# Patient Record
Sex: Female | Born: 1940
Health system: Southern US, Community
[De-identification: ages and names within clinical notes are randomized; demographics above are authoritative.]

## PROBLEM LIST (undated history)

## (undated) DIAGNOSIS — M1990 Unspecified osteoarthritis, unspecified site: Secondary | ICD-10-CM

## (undated) DIAGNOSIS — E119 Type 2 diabetes mellitus without complications: Secondary | ICD-10-CM

## (undated) DIAGNOSIS — Z9289 Personal history of other medical treatment: Secondary | ICD-10-CM

## (undated) DIAGNOSIS — I2510 Atherosclerotic heart disease of native coronary artery without angina pectoris: Secondary | ICD-10-CM

## (undated) DIAGNOSIS — E785 Hyperlipidemia, unspecified: Secondary | ICD-10-CM

## (undated) HISTORY — PX: CARPAL TUNNEL RELEASE: SHX101

## (undated) HISTORY — PX: COLONOSCOPY: SHX174

## (undated) HISTORY — PX: TUBAL LIGATION: SHX77

## (undated) HISTORY — PX: EYE SURGERY: SHX253

## (undated) HISTORY — PX: CARDIAC CATHETERIZATION: SHX172

---

## 1990-01-11 HISTORY — PX: BACK SURGERY: SHX140

## 1994-11-10 HISTORY — PX: CORONARY ARTERY BYPASS GRAFT: SHX141

## 2011-01-18 DIAGNOSIS — M25519 Pain in unspecified shoulder: Secondary | ICD-10-CM | POA: Diagnosis not present

## 2011-02-01 DIAGNOSIS — M25519 Pain in unspecified shoulder: Secondary | ICD-10-CM | POA: Diagnosis not present

## 2011-02-15 DIAGNOSIS — M19049 Primary osteoarthritis, unspecified hand: Secondary | ICD-10-CM | POA: Diagnosis not present

## 2011-02-15 DIAGNOSIS — M76899 Other specified enthesopathies of unspecified lower limb, excluding foot: Secondary | ICD-10-CM | POA: Diagnosis not present

## 2011-02-15 DIAGNOSIS — M719 Bursopathy, unspecified: Secondary | ICD-10-CM | POA: Diagnosis not present

## 2011-02-15 DIAGNOSIS — M67919 Unspecified disorder of synovium and tendon, unspecified shoulder: Secondary | ICD-10-CM | POA: Diagnosis not present

## 2011-02-18 DIAGNOSIS — E11329 Type 2 diabetes mellitus with mild nonproliferative diabetic retinopathy without macular edema: Secondary | ICD-10-CM | POA: Diagnosis not present

## 2011-02-18 DIAGNOSIS — E119 Type 2 diabetes mellitus without complications: Secondary | ICD-10-CM | POA: Diagnosis not present

## 2011-02-24 DIAGNOSIS — I2510 Atherosclerotic heart disease of native coronary artery without angina pectoris: Secondary | ICD-10-CM | POA: Diagnosis not present

## 2011-02-24 DIAGNOSIS — E119 Type 2 diabetes mellitus without complications: Secondary | ICD-10-CM | POA: Diagnosis not present

## 2011-02-24 DIAGNOSIS — I10 Essential (primary) hypertension: Secondary | ICD-10-CM | POA: Diagnosis not present

## 2011-04-16 DIAGNOSIS — H2589 Other age-related cataract: Secondary | ICD-10-CM | POA: Diagnosis not present

## 2011-05-25 DIAGNOSIS — H2589 Other age-related cataract: Secondary | ICD-10-CM | POA: Diagnosis not present

## 2011-05-25 DIAGNOSIS — H269 Unspecified cataract: Secondary | ICD-10-CM | POA: Diagnosis not present

## 2011-05-26 DIAGNOSIS — B354 Tinea corporis: Secondary | ICD-10-CM | POA: Diagnosis not present

## 2011-05-26 DIAGNOSIS — M159 Polyosteoarthritis, unspecified: Secondary | ICD-10-CM | POA: Diagnosis not present

## 2011-06-01 DIAGNOSIS — Z09 Encounter for follow-up examination after completed treatment for conditions other than malignant neoplasm: Secondary | ICD-10-CM | POA: Diagnosis not present

## 2011-06-01 DIAGNOSIS — H04129 Dry eye syndrome of unspecified lacrimal gland: Secondary | ICD-10-CM | POA: Diagnosis not present

## 2011-07-01 DIAGNOSIS — I10 Essential (primary) hypertension: Secondary | ICD-10-CM | POA: Diagnosis not present

## 2011-07-01 DIAGNOSIS — E782 Mixed hyperlipidemia: Secondary | ICD-10-CM | POA: Diagnosis not present

## 2011-07-01 DIAGNOSIS — E119 Type 2 diabetes mellitus without complications: Secondary | ICD-10-CM | POA: Diagnosis not present

## 2011-07-01 DIAGNOSIS — Z1331 Encounter for screening for depression: Secondary | ICD-10-CM | POA: Diagnosis not present

## 2011-07-06 DIAGNOSIS — H2589 Other age-related cataract: Secondary | ICD-10-CM | POA: Diagnosis not present

## 2011-07-06 DIAGNOSIS — H2510 Age-related nuclear cataract, unspecified eye: Secondary | ICD-10-CM | POA: Diagnosis not present

## 2011-07-06 DIAGNOSIS — H269 Unspecified cataract: Secondary | ICD-10-CM | POA: Diagnosis not present

## 2011-09-17 DIAGNOSIS — I10 Essential (primary) hypertension: Secondary | ICD-10-CM | POA: Diagnosis not present

## 2011-09-17 DIAGNOSIS — E119 Type 2 diabetes mellitus without complications: Secondary | ICD-10-CM | POA: Diagnosis not present

## 2011-09-17 DIAGNOSIS — E782 Mixed hyperlipidemia: Secondary | ICD-10-CM | POA: Diagnosis not present

## 2012-12-04 DIAGNOSIS — Z23 Encounter for immunization: Secondary | ICD-10-CM | POA: Diagnosis not present

## 2012-12-04 DIAGNOSIS — E782 Mixed hyperlipidemia: Secondary | ICD-10-CM | POA: Diagnosis not present

## 2012-12-04 DIAGNOSIS — I10 Essential (primary) hypertension: Secondary | ICD-10-CM | POA: Diagnosis not present

## 2012-12-04 DIAGNOSIS — E119 Type 2 diabetes mellitus without complications: Secondary | ICD-10-CM | POA: Diagnosis not present

## 2013-01-01 DIAGNOSIS — M25519 Pain in unspecified shoulder: Secondary | ICD-10-CM | POA: Diagnosis not present

## 2013-01-01 DIAGNOSIS — G8929 Other chronic pain: Secondary | ICD-10-CM | POA: Diagnosis not present

## 2013-01-01 DIAGNOSIS — R05 Cough: Secondary | ICD-10-CM | POA: Diagnosis not present

## 2013-01-01 DIAGNOSIS — Z1389 Encounter for screening for other disorder: Secondary | ICD-10-CM | POA: Diagnosis not present

## 2013-01-15 DIAGNOSIS — Z1389 Encounter for screening for other disorder: Secondary | ICD-10-CM | POA: Diagnosis not present

## 2013-01-15 DIAGNOSIS — Z6825 Body mass index (BMI) 25.0-25.9, adult: Secondary | ICD-10-CM | POA: Diagnosis not present

## 2013-01-15 DIAGNOSIS — M25519 Pain in unspecified shoulder: Secondary | ICD-10-CM | POA: Diagnosis not present

## 2013-01-23 DIAGNOSIS — M719 Bursopathy, unspecified: Secondary | ICD-10-CM | POA: Diagnosis not present

## 2013-01-23 DIAGNOSIS — M75120 Complete rotator cuff tear or rupture of unspecified shoulder, not specified as traumatic: Secondary | ICD-10-CM | POA: Diagnosis not present

## 2013-01-23 DIAGNOSIS — M25519 Pain in unspecified shoulder: Secondary | ICD-10-CM | POA: Diagnosis not present

## 2013-01-23 DIAGNOSIS — M67919 Unspecified disorder of synovium and tendon, unspecified shoulder: Secondary | ICD-10-CM | POA: Diagnosis not present

## 2013-06-15 DIAGNOSIS — Z6827 Body mass index (BMI) 27.0-27.9, adult: Secondary | ICD-10-CM | POA: Diagnosis not present

## 2013-06-15 DIAGNOSIS — Z1331 Encounter for screening for depression: Secondary | ICD-10-CM | POA: Diagnosis not present

## 2013-06-15 DIAGNOSIS — E119 Type 2 diabetes mellitus without complications: Secondary | ICD-10-CM | POA: Diagnosis not present

## 2013-06-15 DIAGNOSIS — E782 Mixed hyperlipidemia: Secondary | ICD-10-CM | POA: Diagnosis not present

## 2013-06-15 DIAGNOSIS — I10 Essential (primary) hypertension: Secondary | ICD-10-CM | POA: Diagnosis not present

## 2013-06-26 DIAGNOSIS — Z139 Encounter for screening, unspecified: Secondary | ICD-10-CM | POA: Diagnosis not present

## 2013-06-26 DIAGNOSIS — M25569 Pain in unspecified knee: Secondary | ICD-10-CM | POA: Diagnosis not present

## 2013-06-26 DIAGNOSIS — M1710 Unilateral primary osteoarthritis, unspecified knee: Secondary | ICD-10-CM | POA: Diagnosis not present

## 2013-06-26 DIAGNOSIS — M1990 Unspecified osteoarthritis, unspecified site: Secondary | ICD-10-CM | POA: Diagnosis not present

## 2013-06-26 DIAGNOSIS — R937 Abnormal findings on diagnostic imaging of other parts of musculoskeletal system: Secondary | ICD-10-CM | POA: Diagnosis not present

## 2013-06-26 DIAGNOSIS — Z6827 Body mass index (BMI) 27.0-27.9, adult: Secondary | ICD-10-CM | POA: Diagnosis not present

## 2013-06-26 DIAGNOSIS — IMO0002 Reserved for concepts with insufficient information to code with codable children: Secondary | ICD-10-CM | POA: Diagnosis not present

## 2013-06-27 DIAGNOSIS — Z951 Presence of aortocoronary bypass graft: Secondary | ICD-10-CM | POA: Diagnosis not present

## 2013-06-27 DIAGNOSIS — E119 Type 2 diabetes mellitus without complications: Secondary | ICD-10-CM | POA: Diagnosis not present

## 2013-06-27 DIAGNOSIS — I2510 Atherosclerotic heart disease of native coronary artery without angina pectoris: Secondary | ICD-10-CM | POA: Diagnosis not present

## 2013-06-27 DIAGNOSIS — E785 Hyperlipidemia, unspecified: Secondary | ICD-10-CM | POA: Diagnosis not present

## 2013-06-27 DIAGNOSIS — I10 Essential (primary) hypertension: Secondary | ICD-10-CM | POA: Diagnosis not present

## 2013-07-03 DIAGNOSIS — Z1231 Encounter for screening mammogram for malignant neoplasm of breast: Secondary | ICD-10-CM | POA: Diagnosis not present

## 2013-07-10 DIAGNOSIS — M1710 Unilateral primary osteoarthritis, unspecified knee: Secondary | ICD-10-CM | POA: Diagnosis not present

## 2013-07-10 DIAGNOSIS — Z6827 Body mass index (BMI) 27.0-27.9, adult: Secondary | ICD-10-CM | POA: Diagnosis not present

## 2013-07-10 DIAGNOSIS — IMO0002 Reserved for concepts with insufficient information to code with codable children: Secondary | ICD-10-CM | POA: Diagnosis not present

## 2013-10-15 DIAGNOSIS — M179 Osteoarthritis of knee, unspecified: Secondary | ICD-10-CM | POA: Diagnosis not present

## 2013-10-15 DIAGNOSIS — Z139 Encounter for screening, unspecified: Secondary | ICD-10-CM | POA: Diagnosis not present

## 2013-10-15 DIAGNOSIS — Z23 Encounter for immunization: Secondary | ICD-10-CM | POA: Diagnosis not present

## 2013-10-15 DIAGNOSIS — Z0000 Encounter for general adult medical examination without abnormal findings: Secondary | ICD-10-CM | POA: Diagnosis not present

## 2013-10-15 DIAGNOSIS — Z1389 Encounter for screening for other disorder: Secondary | ICD-10-CM | POA: Diagnosis not present

## 2013-10-23 DIAGNOSIS — M1711 Unilateral primary osteoarthritis, right knee: Secondary | ICD-10-CM | POA: Diagnosis not present

## 2013-11-22 DIAGNOSIS — E119 Type 2 diabetes mellitus without complications: Secondary | ICD-10-CM | POA: Diagnosis not present

## 2013-11-22 DIAGNOSIS — I10 Essential (primary) hypertension: Secondary | ICD-10-CM | POA: Diagnosis not present

## 2013-11-23 DIAGNOSIS — M1712 Unilateral primary osteoarthritis, left knee: Secondary | ICD-10-CM | POA: Diagnosis not present

## 2013-11-23 DIAGNOSIS — M25562 Pain in left knee: Secondary | ICD-10-CM | POA: Diagnosis not present

## 2013-11-26 DIAGNOSIS — E1151 Type 2 diabetes mellitus with diabetic peripheral angiopathy without gangrene: Secondary | ICD-10-CM | POA: Diagnosis not present

## 2013-11-26 DIAGNOSIS — Z951 Presence of aortocoronary bypass graft: Secondary | ICD-10-CM | POA: Diagnosis not present

## 2013-11-26 DIAGNOSIS — I10 Essential (primary) hypertension: Secondary | ICD-10-CM | POA: Diagnosis not present

## 2013-11-26 DIAGNOSIS — I2510 Atherosclerotic heart disease of native coronary artery without angina pectoris: Secondary | ICD-10-CM | POA: Diagnosis not present

## 2013-11-26 DIAGNOSIS — E785 Hyperlipidemia, unspecified: Secondary | ICD-10-CM | POA: Diagnosis not present

## 2014-01-15 DIAGNOSIS — M179 Osteoarthritis of knee, unspecified: Secondary | ICD-10-CM | POA: Diagnosis not present

## 2014-01-15 DIAGNOSIS — Z6830 Body mass index (BMI) 30.0-30.9, adult: Secondary | ICD-10-CM | POA: Diagnosis not present

## 2014-01-22 DIAGNOSIS — E119 Type 2 diabetes mellitus without complications: Secondary | ICD-10-CM | POA: Diagnosis not present

## 2014-01-22 DIAGNOSIS — I10 Essential (primary) hypertension: Secondary | ICD-10-CM | POA: Diagnosis not present

## 2014-01-22 DIAGNOSIS — I2510 Atherosclerotic heart disease of native coronary artery without angina pectoris: Secondary | ICD-10-CM | POA: Diagnosis not present

## 2014-01-22 DIAGNOSIS — Z951 Presence of aortocoronary bypass graft: Secondary | ICD-10-CM | POA: Diagnosis not present

## 2014-01-22 DIAGNOSIS — E785 Hyperlipidemia, unspecified: Secondary | ICD-10-CM | POA: Diagnosis not present

## 2014-01-31 DIAGNOSIS — M1990 Unspecified osteoarthritis, unspecified site: Secondary | ICD-10-CM | POA: Diagnosis not present

## 2014-01-31 DIAGNOSIS — M25561 Pain in right knee: Secondary | ICD-10-CM | POA: Diagnosis not present

## 2014-02-05 DIAGNOSIS — M170 Bilateral primary osteoarthritis of knee: Secondary | ICD-10-CM | POA: Diagnosis not present

## 2014-02-12 DIAGNOSIS — M170 Bilateral primary osteoarthritis of knee: Secondary | ICD-10-CM | POA: Diagnosis not present

## 2014-02-19 DIAGNOSIS — M170 Bilateral primary osteoarthritis of knee: Secondary | ICD-10-CM | POA: Diagnosis not present

## 2014-02-19 DIAGNOSIS — M25461 Effusion, right knee: Secondary | ICD-10-CM | POA: Diagnosis not present

## 2014-02-26 DIAGNOSIS — M1711 Unilateral primary osteoarthritis, right knee: Secondary | ICD-10-CM | POA: Diagnosis not present

## 2014-02-27 ENCOUNTER — Other Ambulatory Visit: Payer: Self-pay | Admitting: Orthopedic Surgery

## 2014-03-05 DIAGNOSIS — M1711 Unilateral primary osteoarthritis, right knee: Secondary | ICD-10-CM | POA: Diagnosis not present

## 2014-03-14 DIAGNOSIS — I2510 Atherosclerotic heart disease of native coronary artery without angina pectoris: Secondary | ICD-10-CM | POA: Diagnosis not present

## 2014-03-14 DIAGNOSIS — E1151 Type 2 diabetes mellitus with diabetic peripheral angiopathy without gangrene: Secondary | ICD-10-CM | POA: Diagnosis not present

## 2014-03-14 DIAGNOSIS — I10 Essential (primary) hypertension: Secondary | ICD-10-CM | POA: Diagnosis not present

## 2014-03-14 DIAGNOSIS — E785 Hyperlipidemia, unspecified: Secondary | ICD-10-CM | POA: Diagnosis not present

## 2014-03-20 ENCOUNTER — Other Ambulatory Visit (HOSPITAL_COMMUNITY): Payer: Self-pay | Admitting: *Deleted

## 2014-03-20 NOTE — Pre-Procedure Instructions (Signed)
Mary [name]  03/20/2014   Your procedure is scheduled on:  Monday, April 01, 2014    Report to [facility] Entrance "A" Admitting Office at 8:00 AM.   Call this number if you have problems the morning of surgery: [phone]               Any questions prior to day of surgery, please call [phone] between 8 & 4 PM, Monday-Friday   Remember:   Do not eat food or drink liquids after midnight Sunday, 03/31/14   Take these medicines the morning of surgery with A SIP OF WATER:    Do not wear jewelry, make-up or nail polish.  Do not wear lotions, powders, or perfumes. You may wear deodorant.  Do not shave 48 hours prior to surgery.   Do not bring valuables to the hospital.  [facility] is not responsible                  for any belongings or valuables.               Contacts, dentures or bridgework may not be worn into surgery.  Leave suitcase in the car. After surgery it may be brought to your room.  For patients admitted to the hospital, discharge time is determined by your                treatment team.             Special Instructions: See "Preparing for Surgery" Instruction sheet.   Please read over the following fact sheets that you were given: Pain Booklet, Coughing and Deep Breathing, Blood Transfusion Information, MRSA Information and Surgical Site Infection Prevention

## 2014-03-21 ENCOUNTER — Encounter (HOSPITAL_COMMUNITY)
Admission: RE | Admit: 2014-03-21 | Discharge: 2014-03-21 | Disposition: A | Discharge status: Home / Self Care | Payer: Medicare Other | Source: Ambulatory Visit | Attending: Orthopedic Surgery | Admitting: Orthopedic Surgery

## 2014-03-21 ENCOUNTER — Encounter (HOSPITAL_COMMUNITY): Payer: Self-pay

## 2014-03-21 DIAGNOSIS — Z01818 Encounter for other preprocedural examination: Secondary | ICD-10-CM | POA: Insufficient documentation

## 2014-03-21 DIAGNOSIS — Z01812 Encounter for preprocedural laboratory examination: Secondary | ICD-10-CM | POA: Insufficient documentation

## 2014-03-21 DIAGNOSIS — M1711 Unilateral primary osteoarthritis, right knee: Secondary | ICD-10-CM | POA: Insufficient documentation

## 2014-03-21 HISTORY — DX: Unspecified osteoarthritis, unspecified site: M19.90

## 2014-03-21 HISTORY — DX: Hyperlipidemia, unspecified: E78.5

## 2014-03-21 HISTORY — DX: Type 2 diabetes mellitus without complications: E11.9

## 2014-03-21 HISTORY — DX: Personal history of other medical treatment: Z92.89

## 2014-03-21 LAB — ABO/RH: ABO/RH(D): O POS

## 2014-03-21 LAB — SURGICAL PCR SCREEN
MRSA, PCR: POSITIVE — AB
STAPHYLOCOCCUS AUREUS: POSITIVE — AB

## 2014-03-21 LAB — BASIC METABOLIC PANEL
Anion gap: 10 (ref 5–15)
BUN: 9 mg/dL (ref 6–23)
CALCIUM: 9.7 mg/dL (ref 8.4–10.5)
CO2: 29 mmol/L (ref 19–32)
Chloride: 101 mmol/L (ref 96–112)
Creatinine, Ser: 0.61 mg/dL (ref 0.50–1.10)
GFR calc Af Amer: >90 mL/min (ref >90)
GFR calc non Af Amer: 88 mL/min — ABNORMAL LOW (ref >90)
Glucose, Bld: 110 mg/dL — ABNORMAL HIGH (ref 70–99)
Potassium: 4.4 mmol/L (ref 3.5–5.1)
Sodium: 140 mmol/L (ref 135–145)

## 2014-03-21 LAB — CBC WITH DIFFERENTIAL/PLATELET
BASOS ABS: 0.1 10*3/uL (ref 0.0–0.1)
Basophils Relative: 1 % (ref 0–1)
Eosinophils Absolute: 0.1 10*3/uL (ref 0.0–0.7)
Eosinophils Relative: 1 % (ref 0–5)
HCT: 41.1 % (ref 36.0–46.0)
Hemoglobin: 13.7 g/dL (ref 12.0–15.0)
LYMPHS PCT: 24 % (ref 12–46)
Lymphs Abs: 2.4 10*3/uL (ref 0.7–4.0)
MCH: 29.1 pg (ref 26.0–34.0)
MCHC: 33.3 g/dL (ref 30.0–36.0)
MCV: 87.3 fL (ref 78.0–100.0)
Monocytes Absolute: 0.6 10*3/uL (ref 0.1–1.0)
Monocytes Relative: 6 % (ref 3–12)
NEUTROS PCT: 68 % (ref 43–77)
Neutro Abs: 6.8 10*3/uL (ref 1.7–7.7)
Platelets: 269 10*3/uL (ref 150–400)
RBC: 4.71 MIL/uL (ref 3.87–5.11)
RDW: 12.3 % (ref 11.5–15.5)
WBC: 9.9 10*3/uL (ref 4.0–10.5)

## 2014-03-21 LAB — URINALYSIS, ROUTINE W REFLEX MICROSCOPIC
BILIRUBIN URINE: NEGATIVE
Glucose, UA: NEGATIVE mg/dL
Hgb urine dipstick: NEGATIVE
Ketones, ur: NEGATIVE mg/dL
Leukocytes, UA: NEGATIVE
Nitrite: NEGATIVE
Protein, ur: NEGATIVE mg/dL
SPECIFIC GRAVITY, URINE: 1.016 (ref 1.005–1.030)
UROBILINOGEN UA: 0.2 mg/dL (ref 0.0–1.0)
pH: 6.5 (ref 5.0–8.0)

## 2014-03-21 LAB — TYPE AND SCREEN
ABO/RH(D): O POS
ANTIBODY SCREEN: NEGATIVE

## 2014-03-21 LAB — PROTIME-INR
INR: 1.07 (ref 0.00–1.49)
Prothrombin Time: 14 seconds (ref 11.6–15.2)

## 2014-03-21 LAB — APTT: APTT: 32 s (ref 24–37)

## 2014-03-21 NOTE — Progress Notes (Addendum)
Anesthesia Chart Review: Patient is a 74 year old female scheduled for right TKA on 04/01/14 by Dr. Turner [name]. Anesthesia is posted for spinal.  History includes non-smoker, HLD, DM2, arthritis, CAD s/p CABG X 3 in 1996, back surgery, glaucoma surgery.   PCP is Dr. Charlott RakesFrancisco [name] in [place], [place].  She was seen on 03/14/14 for preoperative evaluation. He cleared her from a medical standpoint, but is referring her to [facility] in [place] for a preoperative cardiac evaluation--which is scheduled for 03/25/14.          Meds include amlodipine-benazepril 10/20 mg, Plavix, Tricor, sitagliptin-metformin. She has been told to hold Plavix starting 03/25/14.   12/25/12 Nuclear stress test: Negative exercise perfusion stress test. Normal wall motion and thickening with EF calculated at 57%.  03/21/14 CXR: No acute abnormality noted.   Preoperative labs noted.   EKG and cardiac clearance pending her pre-operative evaluation next week.  Chart will be left for follow-up.  Mary Ochsllison Justine Cossin, PA-C [facility]/Anesthesiology Phone [phone] 03/21/2014 4:58 PM  Addendum: Patient was seen by Dr. Tomie [name] with [facility] in [place].  He ordered a preoperative nuclear stress test which was done on 03/26/14 and showed: No evidence of ischemia on the study, normal systolic function of 61%. By stress test report, stress and resting EKG showed SR and no significant changes.  I contacted Dr. Kem [name]'s office.  They will attempt to obtain a copy of her recent nuclear stress test EKG and fax it to [phone].  If not received, we will need to do an EKG on arrival.  Per Dr. Rennis [name]'s 03/25/14 note, if her stress test is negative then she would not be considered high risk for coronary events during planned surgery. He felt meticulous hemodynamic monitoring would further decrease her risk of coronary events.  He gave permission to hold Plavix preoperatively. She is not on b-blocker therapy due to  "borderline blood pressure and heart rate."  Mary Ochsllison Staphanie Harbison, PA-C [facility]/Anesthesiology Phone [phone] 03/27/2014 2:31 PM

## 2014-03-21 NOTE — Progress Notes (Addendum)
PAT visit completed using [name], interpreter.  Mary [name] report that fasting CBG's 133 and under, rarely over 150.  After knee injectd with Prednsone CBG was 200.  PCP is Dr Mary [name] in [place], Mary [name].  Patient see a cardiologist, unsure of his name. Patient has an appointment with cardiologist March 14.  Patient reports that she had an EKG in Sept or Oct at cardiologist office.  I have left messages with pat's son and Dr Everlean AlstromHodges's office to fid the name of the cardiologist.  Mary Mary [name] reported that Dr Turner Danielsowan instructed her to stop Plavix on March 14.

## 2014-03-21 NOTE — Progress Notes (Signed)
I [facility] a [facility]intment [facility] [address] [address], [place], [place].

## 2014-03-21 NOTE — Pre-Procedure Instructions (Signed)
Mary [name]  03/21/2014   Your procedure is scheduled on:  Monday, April 01, 2014    Report to [facility] Entrance "A" Admitting Office at 8:00 AM.   Call this number if you have problems the morning of surgery: [phone]               Any questions prior to day of surgery, please call [phone] between 8 & 4 PM, Monday-Friday   Remember:   Do not eat food or drink liquids after midnight Sunday, 03/31/14   Take these medicines the morning of surgery with A SIP OF WATER: None            Stop taking Diclopex Forte on March 14.            Stop taking Plavix per Dr instructions.      Do not wear jewelry, make-up or nail polish.  Do not wear lotions, powders, or perfumes. You may wear deodorant.  Do not shave 48 hours prior to surgery.   Do not bring valuables to the hospital.  [facility] is not responsible for any belongings or valuables.               Contacts, dentures or bridgework may not be worn into surgery.  Leave suitcase in the car. After surgery it may be brought to your room.  For patients admitted to the hospital, discharge time is determined by your  treatment team.             Special Instructions: See "Preparing for Surgery" Instruction sheet.   Please read over the following fact sheets that you were given: Pain Booklet, Coughing and Deep Breathing, Blood Transfusion Information, MRSA Information and Surgical Site Infection Prevention

## 2014-03-25 DIAGNOSIS — I2510 Atherosclerotic heart disease of native coronary artery without angina pectoris: Secondary | ICD-10-CM | POA: Diagnosis not present

## 2014-03-25 DIAGNOSIS — Z01810 Encounter for preprocedural cardiovascular examination: Secondary | ICD-10-CM | POA: Diagnosis not present

## 2014-03-26 DIAGNOSIS — Z01810 Encounter for preprocedural cardiovascular examination: Secondary | ICD-10-CM | POA: Diagnosis not present

## 2014-03-26 DIAGNOSIS — I2510 Atherosclerotic heart disease of native coronary artery without angina pectoris: Secondary | ICD-10-CM | POA: Diagnosis not present

## 2014-03-29 DIAGNOSIS — M1711 Unilateral primary osteoarthritis, right knee: Secondary | ICD-10-CM

## 2014-03-29 HISTORY — DX: Unilateral primary osteoarthritis, right knee: M17.11

## 2014-03-29 NOTE — H&P (Signed)
TOTAL KNEE ADMISSION H&P  Patient is being admitted for right total knee arthroplasty.  Subjective:  Chief Complaint:right knee pain.  HPI: Mary [name], 74 y.o. female, has a history of pain and functional disability in the right knee due to arthritis and has failed non-surgical conservative treatments for greater than 12 weeks to includeNSAID's and/or analgesics, corticosteriod injections, viscosupplementation injections, use of assistive devices, weight reduction as appropriate and activity modification.  Onset of symptoms was gradual, starting several years ago with gradually worsening course since that time. The patient noted no past surgery on the right knee(s).  Patient currently rates pain in the right knee(s) at 10 out of 10 with activity. Patient has night pain, worsening of pain with activity and weight bearing, pain that interferes with activities of daily living, pain with passive range of motion, crepitus and joint swelling.  Patient has evidence of periarticular osteophytes, joint subluxation and joint space narrowing by imaging studies. There is no active infection.  There are no active problems to display for this patient.  Past Medical History  Diagnosis Date  . Hyperlipemia   . Diabetes mellitus without complication     DM II  . Arthritis   . H/O transfusion     Past Surgical History  Procedure Laterality Date  . Coronary artery bypass graft  11/10/1994    x 3  . Back surgery  1992  . Tubal ligation    . Carpal tunnel release Left   . Cardiac catheterization    . Colonoscopy    . Eye surgery Bilateral     Glaucoma    No prescriptions prior to admission   No Known Allergies  History  Substance Use Topics  . Smoking status: Never Smoker   . Smokeless tobacco: Not on file  . Alcohol Use: No    No family history on file.   Review of Systems  Constitutional: Negative.   HENT: Negative.   Eyes:       Poor vision.  Respiratory: Negative.   Cardiovascular:        HTN  Gastrointestinal: Negative.   Genitourinary: Negative.   Musculoskeletal: Positive for joint pain.  Skin: Negative.   Neurological: Negative.   Endo/Heme/Allergies: Negative.   Psychiatric/Behavioral: Negative.     Objective:  Physical Exam  Constitutional: She is oriented to person, place, and time. She appears well-developed and well-nourished.  HENT:  Head: Normocephalic and atraumatic.  Eyes: Pupils are equal, round, and reactive to light.  Neck: Normal range of motion. Neck supple.  Cardiovascular: Intact distal pulses.   Respiratory: Effort normal.  Musculoskeletal: She exhibits tenderness.  The right knee has not obvious varus deformity, trace effusion tender along the medial joint line.  Range of motion is 10/120 collateral ligaments are stable.  When you flex her to 120 it causes significant pain.  X-rays are reviewed and are as dictated.  Neurological: She is alert and oriented to person, place, and time.  Skin: Skin is warm and dry.  Psychiatric: She has a normal mood and affect. Her behavior is normal. Judgment and thought content normal.    Vital signs in last 24 hours:    Labs:   There is no height or weight on file to calculate BMI.   Imaging Review Plain radiographs demonstrate end-stage arthritis of the right knee with lateral subluxation of tibia beneath the femur.    Assessment/Plan:  End stage arthritis, right knee   The patient history, physical examination, clinical judgment of the provider  and imaging studies are consistent with end stage degenerative joint disease of the right knee(s) and total knee arthroplasty is deemed medically necessary. The treatment options including medical management, injection therapy arthroscopy and arthroplasty were discussed at length. The risks and benefits of total knee arthroplasty were presented and reviewed. The risks due to aseptic loosening, infection, stiffness, patella tracking problems,  thromboembolic complications and other imponderables were discussed. The patient acknowledged the explanation, agreed to proceed with the plan and consent was signed. Patient is being admitted for inpatient treatment for surgery, pain control, PT, OT, prophylactic antibiotics, VTE prophylaxis, progressive ambulation and ADL's and discharge planning. The patient is planning to be discharged home with home health services

## 2014-03-29 NOTE — Progress Notes (Signed)
I spoke with patients son and gave him the new arrival time of 0700.  An interpreter has been requested for 0800 arrival, I am unable to notify the office  For interpreter because it is closed.

## 2014-03-31 MED ORDER — CHLORHEXIDINE GLUCONATE 4 % EX LIQD
60.0000 mL | Freq: Once | CUTANEOUS | Status: DC
  Filled 2014-03-31: qty 60

## 2014-03-31 MED ORDER — BUPIVACAINE LIPOSOME 1.3 % IJ SUSP
20.0000 mL | Freq: Once | INTRAMUSCULAR | Status: AC
Start: 2014-03-31 — End: 2014-04-01
  Administered 2014-04-01: 20 mL
  Filled 2014-03-31: qty 20

## 2014-03-31 MED ORDER — TRANEXAMIC ACID 100 MG/ML IV SOLN
1000.0000 mg | INTRAVENOUS | Status: AC
  Administered 2014-04-01: 1000 mg via INTRAVENOUS
  Filled 2014-03-31: qty 10

## 2014-03-31 MED ORDER — CEFAZOLIN SODIUM-DEXTROSE 2-3 GM-% IV SOLR: 2.0000 g | INTRAVENOUS | Status: AC

## 2014-03-31 MED ORDER — DEXTROSE-NACL 5-0.45 % IV SOLN: INTRAVENOUS | Status: DC

## 2014-04-01 ENCOUNTER — Encounter (HOSPITAL_COMMUNITY): Payer: Self-pay | Admitting: Anesthesiology

## 2014-04-01 ENCOUNTER — Encounter (HOSPITAL_COMMUNITY)
Admission: RE | Disposition: A | Discharge status: Home Health | Payer: Self-pay | Source: Ambulatory Visit | Attending: Orthopedic Surgery

## 2014-04-01 ENCOUNTER — Other Ambulatory Visit: Payer: Self-pay

## 2014-04-01 ENCOUNTER — Inpatient Hospital Stay (HOSPITAL_COMMUNITY)
Admission: RE | Admit: 2014-04-01 | Discharge: 2014-04-04 | DRG: 470 | Disposition: A | Discharge status: Home Health | Payer: Medicare Other | Source: Ambulatory Visit | Attending: Orthopedic Surgery | Admitting: Orthopedic Surgery

## 2014-04-01 ENCOUNTER — Inpatient Hospital Stay (HOSPITAL_COMMUNITY): Payer: Medicare Other | Admitting: Vascular Surgery

## 2014-04-01 ENCOUNTER — Inpatient Hospital Stay (HOSPITAL_COMMUNITY): Payer: Medicare Other | Admitting: Anesthesiology

## 2014-04-01 DIAGNOSIS — E119 Type 2 diabetes mellitus without complications: Secondary | ICD-10-CM | POA: Diagnosis present

## 2014-04-01 DIAGNOSIS — E785 Hyperlipidemia, unspecified: Secondary | ICD-10-CM | POA: Diagnosis present

## 2014-04-01 DIAGNOSIS — M179 Osteoarthritis of knee, unspecified: Secondary | ICD-10-CM | POA: Diagnosis not present

## 2014-04-01 DIAGNOSIS — Z7902 Long term (current) use of antithrombotics/antiplatelets: Secondary | ICD-10-CM

## 2014-04-01 DIAGNOSIS — M1711 Unilateral primary osteoarthritis, right knee: Secondary | ICD-10-CM

## 2014-04-01 DIAGNOSIS — Z7982 Long term (current) use of aspirin: Secondary | ICD-10-CM | POA: Diagnosis not present

## 2014-04-01 DIAGNOSIS — I10 Essential (primary) hypertension: Secondary | ICD-10-CM | POA: Diagnosis present

## 2014-04-01 DIAGNOSIS — Z79899 Other long term (current) drug therapy: Secondary | ICD-10-CM | POA: Diagnosis not present

## 2014-04-01 DIAGNOSIS — D62 Acute posthemorrhagic anemia: Secondary | ICD-10-CM | POA: Diagnosis not present

## 2014-04-01 DIAGNOSIS — M25561 Pain in right knee: Secondary | ICD-10-CM | POA: Diagnosis not present

## 2014-04-01 DIAGNOSIS — R262 Difficulty in walking, not elsewhere classified: Secondary | ICD-10-CM | POA: Diagnosis not present

## 2014-04-01 DIAGNOSIS — M150 Primary generalized (osteo)arthritis: Secondary | ICD-10-CM | POA: Diagnosis not present

## 2014-04-01 DIAGNOSIS — M6281 Muscle weakness (generalized): Secondary | ICD-10-CM | POA: Diagnosis not present

## 2014-04-01 DIAGNOSIS — R110 Nausea: Secondary | ICD-10-CM | POA: Diagnosis not present

## 2014-04-01 HISTORY — PX: TOTAL KNEE ARTHROPLASTY: SHX125

## 2014-04-01 LAB — GLUCOSE, CAPILLARY
Glucose-Capillary: 143 mg/dL — ABNORMAL HIGH (ref 70–99)
Glucose-Capillary: 105 mg/dL — ABNORMAL HIGH (ref 70–99)
Glucose-Capillary: 168 mg/dL — ABNORMAL HIGH (ref 70–99)

## 2014-04-01 SURGERY — ARTHROPLASTY, KNEE, TOTAL
Anesthesia: Monitor Anesthesia Care | Site: Knee | Laterality: Right

## 2014-04-01 MED ORDER — PROMETHAZINE HCL 25 MG/ML IJ SOLN
6.2500 mg | INTRAMUSCULAR | Status: DC | PRN
  Administered 2014-04-01: 6.25 mg via INTRAVENOUS

## 2014-04-01 MED ORDER — FENTANYL CITRATE 0.05 MG/ML IJ SOLN
INTRAMUSCULAR | Status: AC
  Filled 2014-04-01: qty 2

## 2014-04-01 MED ORDER — ALUM & MAG HYDROXIDE-SIMETH 200-200-20 MG/5ML PO SUSP: 30.0000 mL | ORAL | Status: DC | PRN

## 2014-04-01 MED ORDER — SODIUM CHLORIDE 0.9 % IJ SOLN
INTRAMUSCULAR | Status: DC | PRN
  Administered 2014-04-01: 40 mL

## 2014-04-01 MED ORDER — METHOCARBAMOL 500 MG PO TABS
500.0000 mg | ORAL_TABLET | Freq: Four times a day (QID) | ORAL | Status: DC | PRN
  Administered 2014-04-01 – 2014-04-04 (×6): 500 mg via ORAL
  Filled 2014-04-01 (×7): qty 1

## 2014-04-01 MED ORDER — ONDANSETRON HCL 4 MG/2ML IJ SOLN
INTRAMUSCULAR | Status: DC | PRN
  Administered 2014-04-01: 4 mg via INTRAVENOUS

## 2014-04-01 MED ORDER — OXYCODONE HCL 5 MG PO TABS
5.0000 mg | ORAL_TABLET | ORAL | Status: DC | PRN
  Administered 2014-04-01 (×2): 5 mg via ORAL
  Administered 2014-04-02 – 2014-04-04 (×8): 10 mg via ORAL
  Administered 2014-04-04: 5 mg via ORAL
  Filled 2014-04-01 (×2): qty 2
  Filled 2014-04-01: qty 1
  Filled 2014-04-01 (×8): qty 2

## 2014-04-01 MED ORDER — SODIUM CHLORIDE 0.9 % IR SOLN
Status: DC | PRN
  Administered 2014-04-01: 3000 mL

## 2014-04-01 MED ORDER — DIPHENHYDRAMINE HCL 12.5 MG/5ML PO ELIX
12.5000 mg | ORAL_SOLUTION | ORAL | Status: DC | PRN
  Administered 2014-04-02: 25 mg via ORAL
  Filled 2014-04-01: qty 10

## 2014-04-01 MED ORDER — PROPOFOL 10 MG/ML IV BOLUS
INTRAVENOUS | Status: AC
  Filled 2014-04-01: qty 20

## 2014-04-01 MED ORDER — AMLODIPINE BESYLATE 10 MG PO TABS
10.0000 mg | ORAL_TABLET | Freq: Every day | ORAL | Status: DC
  Administered 2014-04-02 – 2014-04-04 (×3): 10 mg via ORAL
  Filled 2014-04-01 (×3): qty 1

## 2014-04-01 MED ORDER — FLEET ENEMA 7-19 GM/118ML RE ENEM: 1.0000 | ENEMA | Freq: Once | RECTAL | Status: AC | PRN

## 2014-04-01 MED ORDER — METHOCARBAMOL 500 MG PO TABS
ORAL_TABLET | ORAL | Status: AC
  Filled 2014-04-01: qty 1

## 2014-04-01 MED ORDER — CEFUROXIME SODIUM 1.5 G IJ SOLR
INTRAMUSCULAR | Status: AC
  Filled 2014-04-01: qty 1.5

## 2014-04-01 MED ORDER — OXYCODONE-ACETAMINOPHEN 5-325 MG PO TABS: 1.0000 | ORAL_TABLET | ORAL | Status: DC | PRN

## 2014-04-01 MED ORDER — HYDROMORPHONE HCL 1 MG/ML IJ SOLN
INTRAMUSCULAR | Status: AC
Start: 2014-04-01 — End: 2014-04-02
  Filled 2014-04-01: qty 1

## 2014-04-01 MED ORDER — TIZANIDINE HCL 2 MG PO CAPS: 2.0000 mg | ORAL_CAPSULE | Freq: Three times a day (TID) | ORAL | Status: DC

## 2014-04-01 MED ORDER — METOCLOPRAMIDE HCL 5 MG/ML IJ SOLN: 5.0000 mg | Freq: Three times a day (TID) | INTRAMUSCULAR | Status: DC | PRN

## 2014-04-01 MED ORDER — OXYCODONE HCL 5 MG/5ML PO SOLN: 5.0000 mg | Freq: Once | ORAL | Status: AC | PRN

## 2014-04-01 MED ORDER — METOCLOPRAMIDE HCL 10 MG PO TABS: 5.0000 mg | ORAL_TABLET | Freq: Three times a day (TID) | ORAL | Status: DC | PRN

## 2014-04-01 MED ORDER — HYDROMORPHONE HCL 1 MG/ML IJ SOLN
0.2500 mg | INTRAMUSCULAR | Status: DC | PRN
  Administered 2014-04-01 (×7): 0.5 mg via INTRAVENOUS

## 2014-04-01 MED ORDER — BUPIVACAINE IN DEXTROSE 0.75-8.25 % IT SOLN
INTRATHECAL | Status: DC | PRN
  Administered 2014-04-01: 15 mg via INTRATHECAL

## 2014-04-01 MED ORDER — MENTHOL 3 MG MT LOZG: 1.0000 | LOZENGE | OROMUCOSAL | Status: DC | PRN

## 2014-04-01 MED ORDER — PHENOL 1.4 % MT LIQD: 1.0000 | OROMUCOSAL | Status: DC | PRN

## 2014-04-01 MED ORDER — MUPIROCIN 2 % EX OINT
1.0000 "application " | TOPICAL_OINTMENT | Freq: Once | CUTANEOUS | Status: AC
  Administered 2014-04-01: 1 via TOPICAL

## 2014-04-01 MED ORDER — SENNOSIDES-DOCUSATE SODIUM 8.6-50 MG PO TABS: 1.0000 | ORAL_TABLET | Freq: Every evening | ORAL | Status: DC | PRN

## 2014-04-01 MED ORDER — MIDAZOLAM HCL 2 MG/2ML IJ SOLN
INTRAMUSCULAR | Status: AC
  Administered 2014-04-01: 1 mg
  Filled 2014-04-01: qty 2

## 2014-04-01 MED ORDER — CLOPIDOGREL BISULFATE 75 MG PO TABS
75.0000 mg | ORAL_TABLET | Freq: Every day | ORAL | Status: DC
  Administered 2014-04-01 – 2014-04-04 (×4): 75 mg via ORAL
  Filled 2014-04-01 (×4): qty 1

## 2014-04-01 MED ORDER — KCL IN DEXTROSE-NACL 20-5-0.45 MEQ/L-%-% IV SOLN
INTRAVENOUS | Status: DC
  Administered 2014-04-01: 19:00:00 via INTRAVENOUS
  Filled 2014-04-01 (×10): qty 1000

## 2014-04-01 MED ORDER — PROMETHAZINE HCL 25 MG/ML IJ SOLN
INTRAMUSCULAR | Status: AC
  Filled 2014-04-01: qty 1

## 2014-04-01 MED ORDER — INSULIN ASPART 100 UNIT/ML ~~LOC~~ SOLN
0.0000 [IU] | Freq: Three times a day (TID) | SUBCUTANEOUS | Status: DC
  Administered 2014-04-01 – 2014-04-02 (×2): 2 [IU] via SUBCUTANEOUS
  Administered 2014-04-02: 3 [IU] via SUBCUTANEOUS
  Administered 2014-04-02 – 2014-04-04 (×5): 2 [IU] via SUBCUTANEOUS

## 2014-04-01 MED ORDER — LIDOCAINE HCL (CARDIAC) 20 MG/ML IV SOLN
INTRAVENOUS | Status: AC
  Filled 2014-04-01: qty 5

## 2014-04-01 MED ORDER — MIDAZOLAM HCL 2 MG/2ML IJ SOLN
INTRAMUSCULAR | Status: AC
  Filled 2014-04-01: qty 2

## 2014-04-01 MED ORDER — OXYCODONE HCL 5 MG PO TABS
5.0000 mg | ORAL_TABLET | Freq: Once | ORAL | Status: AC | PRN
  Administered 2014-04-01: 5 mg via ORAL

## 2014-04-01 MED ORDER — METFORMIN HCL ER 500 MG PO TB24
500.0000 mg | ORAL_TABLET | Freq: Every day | ORAL | Status: DC
  Administered 2014-04-02 – 2014-04-04 (×3): 500 mg via ORAL
  Filled 2014-04-01 (×3): qty 1

## 2014-04-01 MED ORDER — LINAGLIPTIN 5 MG PO TABS
5.0000 mg | ORAL_TABLET | Freq: Every day | ORAL | Status: DC
  Administered 2014-04-02 – 2014-04-04 (×3): 5 mg via ORAL
  Filled 2014-04-01 (×3): qty 1

## 2014-04-01 MED ORDER — PROPOFOL INFUSION 10 MG/ML OPTIME
INTRAVENOUS | Status: DC | PRN
  Administered 2014-04-01: 75 ug/kg/min via INTRAVENOUS

## 2014-04-01 MED ORDER — HYDROMORPHONE HCL 1 MG/ML IJ SOLN
INTRAMUSCULAR | Status: AC
  Filled 2014-04-01: qty 1

## 2014-04-01 MED ORDER — BENAZEPRIL HCL 20 MG PO TABS
20.0000 mg | ORAL_TABLET | Freq: Every day | ORAL | Status: DC
  Administered 2014-04-02 – 2014-04-04 (×3): 20 mg via ORAL
  Filled 2014-04-01 (×2): qty 1

## 2014-04-01 MED ORDER — HYDROMORPHONE HCL 1 MG/ML IJ SOLN
1.0000 mg | INTRAMUSCULAR | Status: DC | PRN
  Administered 2014-04-01 – 2014-04-02 (×6): 1 mg via INTRAVENOUS
  Filled 2014-04-01 (×6): qty 1

## 2014-04-01 MED ORDER — SITAGLIP PHOS-METFORMIN HCL ER 50-1000 MG PO TB24: 1.0000 | ORAL_TABLET | Freq: Every day | ORAL | Status: DC

## 2014-04-01 MED ORDER — CEFUROXIME SODIUM 1.5 G IJ SOLR
INTRAMUSCULAR | Status: DC | PRN
  Administered 2014-04-01: 1.5 g

## 2014-04-01 MED ORDER — FENTANYL CITRATE 0.05 MG/ML IJ SOLN
INTRAMUSCULAR | Status: DC | PRN
  Administered 2014-04-01 (×3): 50 ug via INTRAVENOUS

## 2014-04-01 MED ORDER — ASPIRIN EC 325 MG PO TBEC
325.0000 mg | DELAYED_RELEASE_TABLET | Freq: Every day | ORAL | Status: DC
  Administered 2014-04-02 – 2014-04-04 (×3): 325 mg via ORAL
  Filled 2014-04-01 (×3): qty 1

## 2014-04-01 MED ORDER — HYDROMORPHONE HCL 1 MG/ML IJ SOLN: 0.5000 mg | INTRAMUSCULAR | Status: DC | PRN

## 2014-04-01 MED ORDER — ONDANSETRON HCL 4 MG PO TABS: 4.0000 mg | ORAL_TABLET | Freq: Four times a day (QID) | ORAL | Status: DC | PRN

## 2014-04-01 MED ORDER — AMLODIPINE BESY-BENAZEPRIL HCL 10-20 MG PO CAPS: 1.0000 | ORAL_CAPSULE | Freq: Every day | ORAL | Status: DC

## 2014-04-01 MED ORDER — OXYCODONE HCL 5 MG PO TABS
ORAL_TABLET | ORAL | Status: AC
  Filled 2014-04-01: qty 1

## 2014-04-01 MED ORDER — MIDAZOLAM HCL 2 MG/2ML IJ SOLN: 1.0000 mg | Freq: Once | INTRAMUSCULAR | Status: DC

## 2014-04-01 MED ORDER — ACETAMINOPHEN 650 MG RE SUPP: 650.0000 mg | Freq: Four times a day (QID) | RECTAL | Status: DC | PRN

## 2014-04-01 MED ORDER — 0.9 % SODIUM CHLORIDE (POUR BTL) OPTIME
TOPICAL | Status: DC | PRN
  Administered 2014-04-01: 1000 mL

## 2014-04-01 MED ORDER — LACTATED RINGERS IV SOLN
INTRAVENOUS | Status: DC
  Administered 2014-04-01: 08:00:00 via INTRAVENOUS

## 2014-04-01 MED ORDER — BISACODYL 5 MG PO TBEC: 5.0000 mg | DELAYED_RELEASE_TABLET | Freq: Every day | ORAL | Status: DC | PRN

## 2014-04-01 MED ORDER — ONDANSETRON HCL 4 MG/2ML IJ SOLN
INTRAMUSCULAR | Status: AC
  Filled 2014-04-01: qty 2

## 2014-04-01 MED ORDER — ACETAMINOPHEN 325 MG PO TABS: 650.0000 mg | ORAL_TABLET | Freq: Four times a day (QID) | ORAL | Status: DC | PRN

## 2014-04-01 MED ORDER — CEFAZOLIN SODIUM-DEXTROSE 2-3 GM-% IV SOLR
INTRAVENOUS | Status: AC
  Administered 2014-04-01: 2 g via INTRAVENOUS
  Filled 2014-04-01: qty 50

## 2014-04-01 MED ORDER — FENTANYL CITRATE 0.05 MG/ML IJ SOLN
INTRAMUSCULAR | Status: AC
  Filled 2014-04-01: qty 5

## 2014-04-01 MED ORDER — DEXTROSE 5 % IV SOLN
500.0000 mg | Freq: Four times a day (QID) | INTRAVENOUS | Status: DC | PRN
  Filled 2014-04-01: qty 5

## 2014-04-01 MED ORDER — ONDANSETRON HCL 4 MG/2ML IJ SOLN: 4.0000 mg | Freq: Four times a day (QID) | INTRAMUSCULAR | Status: DC | PRN

## 2014-04-01 MED ORDER — LACTATED RINGERS IV SOLN
INTRAVENOUS | Status: DC | PRN
  Administered 2014-04-01 (×2): via INTRAVENOUS

## 2014-04-01 MED ORDER — MIDAZOLAM HCL 5 MG/5ML IJ SOLN
INTRAMUSCULAR | Status: DC | PRN
  Administered 2014-04-01: 2 mg via INTRAVENOUS

## 2014-04-01 MED ORDER — DOCUSATE SODIUM 100 MG PO CAPS
100.0000 mg | ORAL_CAPSULE | Freq: Two times a day (BID) | ORAL | Status: DC
  Administered 2014-04-01 – 2014-04-04 (×6): 100 mg via ORAL
  Filled 2014-04-01 (×6): qty 1

## 2014-04-01 MED ORDER — ASPIRIN EC 325 MG PO TBEC: 325.0000 mg | DELAYED_RELEASE_TABLET | Freq: Two times a day (BID) | ORAL | Status: DC

## 2014-04-01 MED ORDER — MUPIROCIN 2 % EX OINT
TOPICAL_OINTMENT | CUTANEOUS | Status: AC
  Filled 2014-04-01: qty 22

## 2014-04-01 SURGICAL SUPPLY — 62 items
BANDAGE ELASTIC 6 VELCRO ST LF (GAUZE/BANDAGES/DRESSINGS) ×3 IMPLANT
BANDAGE ESMARK 6X9 LF (GAUZE/BANDAGES/DRESSINGS) ×1 IMPLANT
BLADE SAG 18X100X1.27 (BLADE) ×3 IMPLANT
BLADE SAW SGTL 13X75X1.27 (BLADE) ×3 IMPLANT
BNDG ELASTIC 6X10 VLCR STRL LF (GAUZE/BANDAGES/DRESSINGS) ×3 IMPLANT
BNDG ESMARK 6X9 LF (GAUZE/BANDAGES/DRESSINGS) ×3
BOWL SMART MIX CTS (DISPOSABLE) ×3 IMPLANT
CAPT KNEE TOTAL 3 ATTUNE ×3 IMPLANT
CEMENT HV SMART SET (Cement) ×6 IMPLANT
COVER SURGICAL LIGHT HANDLE (MISCELLANEOUS) ×3 IMPLANT
CUFF TOURNIQUET SINGLE 34IN LL (TOURNIQUET CUFF) ×3 IMPLANT
DRAPE EXTREMITY T 121X128X90 (DRAPE) ×3 IMPLANT
DRAPE IMP U-DRAPE 54X76 (DRAPES) ×3 IMPLANT
DRAPE U-SHAPE 47X51 STRL (DRAPES) ×3 IMPLANT
DURAPREP 26ML APPLICATOR (WOUND CARE) ×6 IMPLANT
ELECT REM PT RETURN 9FT ADLT (ELECTROSURGICAL) ×3
ELECTRODE REM PT RTRN 9FT ADLT (ELECTROSURGICAL) ×1 IMPLANT
EVACUATOR 1/8 PVC DRAIN (DRAIN) IMPLANT
GAUZE SPONGE 4X4 12PLY STRL (GAUZE/BANDAGES/DRESSINGS) ×6 IMPLANT
GAUZE XEROFORM 1X8 LF (GAUZE/BANDAGES/DRESSINGS) ×3 IMPLANT
GLOVE BIO SURGEON STRL SZ7.5 (GLOVE) ×6 IMPLANT
GLOVE BIO SURGEON STRL SZ8.5 (GLOVE) ×3 IMPLANT
GLOVE BIOGEL PI IND STRL 8 (GLOVE) ×1 IMPLANT
GLOVE BIOGEL PI IND STRL 9 (GLOVE) ×1 IMPLANT
GLOVE BIOGEL PI INDICATOR 8 (GLOVE) ×2
GLOVE BIOGEL PI INDICATOR 9 (GLOVE) ×2
GLOVE SURG SS PI 7.5 STRL IVOR (GLOVE) ×3 IMPLANT
GOWN STRL REUS W/ TWL LRG LVL3 (GOWN DISPOSABLE) ×1 IMPLANT
GOWN STRL REUS W/ TWL XL LVL3 (GOWN DISPOSABLE) ×2 IMPLANT
GOWN STRL REUS W/TWL LRG LVL3 (GOWN DISPOSABLE) ×2
GOWN STRL REUS W/TWL XL LVL3 (GOWN DISPOSABLE) ×4
HANDPIECE INTERPULSE COAX TIP (DISPOSABLE) ×2
HOOD PEEL AWAY FACE SHEILD DIS (HOOD) ×6 IMPLANT
KIT BASIN OR (CUSTOM PROCEDURE TRAY) ×3 IMPLANT
KIT ROOM TURNOVER OR (KITS) ×3 IMPLANT
MANIFOLD NEPTUNE II (INSTRUMENTS) ×3 IMPLANT
NDL SAFETY ECLIPSE 18X1.5 (NEEDLE) ×1 IMPLANT
NEEDLE 22X1 1/2 (OR ONLY) (NEEDLE) ×3 IMPLANT
NEEDLE HYPO 18GX1.5 SHARP (NEEDLE) ×2
NEEDLE SPNL 18GX3.5 QUINCKE PK (NEEDLE) ×3 IMPLANT
NS IRRIG 1000ML POUR BTL (IV SOLUTION) ×3 IMPLANT
PACK TOTAL JOINT (CUSTOM PROCEDURE TRAY) ×3 IMPLANT
PACK UNIVERSAL I (CUSTOM PROCEDURE TRAY) ×3 IMPLANT
PAD ARMBOARD 7.5X6 YLW CONV (MISCELLANEOUS) ×6 IMPLANT
PADDING CAST COTTON 6X4 STRL (CAST SUPPLIES) ×3 IMPLANT
SET HNDPC FAN SPRY TIP SCT (DISPOSABLE) ×1 IMPLANT
SPONGE GAUZE 4X4 12PLY STER LF (GAUZE/BANDAGES/DRESSINGS) ×3 IMPLANT
STAPLER VISISTAT 35W (STAPLE) ×3 IMPLANT
SUCTION FRAZIER TIP 10 FR DISP (SUCTIONS) ×3 IMPLANT
SUT VIC AB 0 CT1 27 (SUTURE) ×2
SUT VIC AB 0 CT1 27XBRD ANBCTR (SUTURE) ×1 IMPLANT
SUT VIC AB 1 CTX 36 (SUTURE) ×2
SUT VIC AB 1 CTX36XBRD ANBCTR (SUTURE) ×1 IMPLANT
SUT VIC AB 2-0 CT1 27 (SUTURE) ×2
SUT VIC AB 2-0 CT1 TAPERPNT 27 (SUTURE) ×1 IMPLANT
SUT VIC AB 3-0 CT1 27 (SUTURE) ×2
SUT VIC AB 3-0 CT1 TAPERPNT 27 (SUTURE) ×1 IMPLANT
SYR 30ML LL (SYRINGE) ×3 IMPLANT
SYR 50ML LL SCALE MARK (SYRINGE) ×3 IMPLANT
TOWEL OR 17X24 6PK STRL BLUE (TOWEL DISPOSABLE) ×3 IMPLANT
TOWEL OR 17X26 10 PK STRL BLUE (TOWEL DISPOSABLE) ×3 IMPLANT
WATER STERILE IRR 1000ML POUR (IV SOLUTION) ×6 IMPLANT

## 2014-04-01 NOTE — Progress Notes (Signed)
Utilization review completed.  

## 2014-04-01 NOTE — Progress Notes (Signed)
Interpreter Graciela Namihira for pre surgery   °

## 2014-04-01 NOTE — Anesthesia Preprocedure Evaluation (Signed)
Anesthesia Evaluation  Patient identified by MRN, date of birth, ID band Patient awake    Reviewed: Allergy & Precautions, NPO status , Patient's Chart, lab work & pertinent test results  History of Anesthesia Complications Negative for: history of anesthetic complications  Airway Mallampati: II  TM Distance: >3 FB Neck ROM: Full    Dental  (+) Edentulous Upper, Partial Lower, Dental Advisory Given   Pulmonary neg pulmonary ROS,    Pulmonary exam normal       Cardiovascular     Neuro/Psych negative neurological ROS     GI/Hepatic negative GI ROS, Neg liver ROS,   Endo/Other  diabetes  Renal/GU      Musculoskeletal   Abdominal   Peds  Hematology   Anesthesia Other Findings   Reproductive/Obstetrics                             Anesthesia Physical Anesthesia Plan  ASA: III  Anesthesia Plan: MAC and Spinal   Post-op Pain Management:    Induction:   Airway Management Planned: Simple Face Mask  Additional Equipment:   Intra-op Plan:   Post-operative Plan:   Informed Consent: I have reviewed the patients History and Physical, chart, labs and discussed the procedure including the risks, benefits and alternatives for the proposed anesthesia with the patient or authorized representative who has indicated his/her understanding and acceptance.   Dental advisory given  Plan Discussed with: CRNA, Anesthesiologist and Surgeon  Anesthesia Plan Comments: (Interpreter was used for the interview)        Anesthesia Quick Evaluation

## 2014-04-01 NOTE — Op Note (Signed)
PATIENT ID:      Mary [name]  MRN:     [id number] DOB/AGE:    December 09, 1940 / 74 y.o.       OPERATIVE REPORT    DATE OF PROCEDURE:  04/01/2014       PREOPERATIVE DIAGNOSIS:   OSTEOARTHRITIS RIGHT KNEE      Estimated body mass index is 27.61 kg/(m^2) as calculated from the following:   Height as of this encounter: 5\' 6"  (1.676 m).   Weight as of this encounter: 77.565 kg (171 lb).                                                        POSTOPERATIVE DIAGNOSIS:   OSTEOARTHRITIS RIGHT KNEE                                                                      PROCEDURE:  Procedure(s): RIGHT TOTAL KNEE ARTHROPLASTY Using DepuyAttune RP implants #6R Femur, #6 Tibia, 6 mm Attune RP bearing, 38 Patella     SURGEON: Dalan Cowger [name]    ASSISTANT:   Eric K. [employer] PA-C   (Present and scrubbed throughout the case, critical for assistance with exposure, retraction, instrumentation, and closure.)         ANESTHESIA: GET, ACB, Exparel  DRAINS: 2 medium hemovac in knee   TOURNIQUET TIME: 15min   COMPLICATIONS:  None     SPECIMENS: None   INDICATIONS FOR PROCEDURE: The patient has  OSTEOARTHRITIS RIGHT KNEE, varus deformities, XR shows bone on bone arthritis. Patient has failed all conservative measures including anti-inflammatory medicines, narcotics, attempts at  exercise and weight loss, cortisone injections and viscosupplementation.  Risks and benefits of surgery have been discussed, questions answered.   DESCRIPTION OF PROCEDURE: The patient identified by armband, received  IV antibiotics, in the holding [facility] at [facility]. Patient taken to the operating room, appropriate anesthetic  monitors were attached, and general endotracheal anesthesia induced with  the patient in supine position. Tourniquet  applied high to the operative thigh. Lateral post and foot positioner  applied to the table, the lower extremity was then prepped and draped  in usual sterile fashion from the ankle to the  tourniquet. Time-out procedure was performed. We began the operation, with the knee flexed, by making the anterior midline incision starting at handbreadth above the patella going over the patella 1 cm medial to and 4 cm distal to the tibial tubercle. Small bleeders in the skin and the  subcutaneous tissue identified and cauterized. Transverse retinaculum was incised and reflected medially and a medial parapatellar arthrotomy was accomplished. the patella was everted and theprepatellar fat pad resected. The superficial medial collateral  ligament was then elevated from anterior to posterior along the proximal  flare of the tibia and anterior half of the menisci resected. The knee was hyperflexed exposing bone on bone arthritis. Peripheral and notch osteophytes as well as the cruciate ligaments were then resected. We continued to  work our way around posteriorly along the proximal tibia, and externally  rotated the tibia subluxing it out from underneath  the femur. A McHale  retractor was placed through the notch and a lateral Hohmann retractor  placed, and we then drilled through the proximal tibia in line with the  axis of the tibia followed by an intramedullary guide rod and 2-degree  posterior slope cutting guide. The tibial cutting guide was pinned into place  allowing resection of 4 mm of bone medially and about 11 mm of bone  laterally because of her varus deformity. Satisfied with the tibial resection, we then  entered the distal femur 2 mm anterior to the PCL origin with the  intramedullary guide rod and applied the distal femoral cutting guide  set at 11mm, with 5 degrees of valgus. This was pinned along the  epicondylar axis. At this point, the distal femoral cut was accomplished without difficulty. We then sized for a #6R femoral component and pinned the guide in 3 degrees of external rotation.The chamfer cutting guide was pinned into place. The anterior, posterior, and chamfer cuts were  accomplished without difficulty followed by  the Attune RP box cutting guide and the box cut. We also removed posterior osteophytes from the posterior femoral condyles. At this  time, the knee was brought into full extension. We checked our  extension and flexion gaps and found them symmetric at 10mm.  The patella thickness measured at 25 mm. We set the cutting guide at 15 and removed the posterior 10 mm  of the patella, sized for a 38 button and drilled the lollipop. The knee  was then once again hyperflexed exposing the proximal tibia. We sized for a #6 tibial base plate, applied the smokestack and the conical reamer followed by the the Delta fin keel punch. We then hammered into place the Attune RP trial femoral component, inserted a 10-mm trial bearing, trial patellar button, and took the knee through range of motion from 0-130 degrees. No thumb pressure was required for patellar  Tracking. At this point, the limb was wrapped with an Esmarch bandage and the tourniquet inflated to 350 mmHg. All trial components were removed, a double batch of DePuy HV cement with 1500 mg of Zinacef was mixed and applied to all bony metallic mating surfaces except for the posterior condyles of the femur itself. In order, we  hammered into place the tibial tray and removed excess cement, the femoral component and removed excess cement, a 5 mm Attune RP bearing  was inserted, and the knee brought to full extension with compression.  The patellar button was clamped into place, and excess cement  removed. While the cement cured the wound was irrigated out with normal saline solution pulse lavage. Ligament stability and patellar tracking were checked and found to be excellent. The parapatellar arthrotomy was closed with  running #1 Vicryl suture. The subcutaneous tissue with 0 and 2-0 undyed  Vicryl suture, and the skin 3-0 SQ vicryl. A dressing of Xeroform,  4 x 4, dressing sponges, Webril, and Ace wrap applied. The  patient  awakened, extubated, and taken to recovery room without difficulty.   Mary [name] 04/01/2014, 10:42 AM

## 2014-04-01 NOTE — Transfer of Care (Signed)
Immediate Anesthesia Transfer of Care Note  Patient: Mary [name]  Procedure(s) Performed: Procedure(s): RIGHT TOTAL KNEE ARTHROPLASTY (Right)  Patient Location: PACU  Anesthesia Type:Spinal  Level of Consciousness: awake and alert   Airway & Oxygen Therapy: Patient Spontanous Breathing and Patient connected to face mask oxygen  Post-op Assessment: Report given to RN and Post -op Vital signs reviewed and stable  Post vital signs: Reviewed and stable  Last Vitals:  Filed Vitals:   04/01/14 1056  BP:   Pulse: 74  Temp: 36.6 C  Resp:     Complications: No apparent anesthesia complications

## 2014-04-01 NOTE — Interval H&P Note (Signed)
History and Physical Interval Note:  04/01/2014 7:17 AM  Mary [name]  has presented today for surgery, with the diagnosis of OSTEOARTHRITIS RIGHT KNEE  The various methods of treatment have been discussed with the patient and family. After consideration of risks, benefits and other options for treatment, the patient has consented to  Procedure(s): RIGHT TOTAL KNEE ARTHROPLASTY (Right) as a surgical intervention .  The patient's history has been reviewed, patient examined, no change in status, stable for surgery.  I have reviewed the patient's chart and labs.  Questions were answered to the patient's satisfaction.     Nestor LewandowskyOWAN,Temisha Murley J

## 2014-04-01 NOTE — Anesthesia Procedure Notes (Signed)
Spinal Patient location during procedure: OR Start time: 04/01/2014 8:58 AM End time: 04/01/2014 9:08 AM Staffing Anesthesiologist: Heather RobertsSINGER, Whitney Bingaman Performed by: anesthesiologist  Preanesthetic Checklist Completed: patient identified, surgical consent, pre-op evaluation, timeout performed, IV checked, risks and benefits discussed and monitors and equipment checked Spinal Block Patient position: sitting Prep: Betadine Patient monitoring: cardiac monitor, continuous pulse ox and blood pressure Approach: midline Injection technique: single-shot Needle Needle type: Pencan  Needle gauge: 24 G Needle length: 9 cm Additional Notes Functioning IV was confirmed and monitors were applied. Sterile prep and drape, including hand hygiene and sterile gloves were used. The patient was positioned and the spine was prepped. The skin was anesthetized with lidocaine.  Free flow of clear CSF was obtained prior to injecting local anesthetic into the CSF.  The spinal needle aspirated freely following injection.  The needle was carefully withdrawn.  The patient tolerated the procedure well.

## 2014-04-01 NOTE — Anesthesia Postprocedure Evaluation (Signed)
  Anesthesia Post-op Note  Patient: Mary [name]  Procedure(s) Performed: Procedure(s): RIGHT TOTAL KNEE ARTHROPLASTY (Right)  Patient Location: PACU  Anesthesia Type:Spinal  Level of Consciousness: awake, alert  and oriented  Airway and Oxygen Therapy: Patient Spontanous Breathing  Post-op Pain: none  Post-op Assessment: Post-op Vital signs reviewed, Patient's Cardiovascular Status Stable and Respiratory Function Stable  Post-op Vital Signs: Reviewed and stable  Last Vitals:  Filed Vitals:   04/01/14 1100  BP:   Pulse: 73  Temp:   Resp: 13    Complications: No apparent anesthesia complications

## 2014-04-02 ENCOUNTER — Encounter (HOSPITAL_COMMUNITY): Payer: Self-pay | Admitting: Orthopedic Surgery

## 2014-04-02 LAB — CBC
HEMATOCRIT: 37.4 % (ref 36.0–46.0)
Hemoglobin: 12.3 g/dL (ref 12.0–15.0)
MCH: 28.9 pg (ref 26.0–34.0)
MCHC: 32.9 g/dL (ref 30.0–36.0)
MCV: 87.8 fL (ref 78.0–100.0)
PLATELETS: 269 10*3/uL (ref 150–400)
RBC: 4.26 MIL/uL (ref 3.87–5.11)
RDW: 12.4 % (ref 11.5–15.5)
WBC: 13.4 10*3/uL — AB (ref 4.0–10.5)

## 2014-04-02 LAB — GLUCOSE, CAPILLARY
Glucose-Capillary: 209 mg/dL — ABNORMAL HIGH (ref 70–99)
Glucose-Capillary: 180 mg/dL — ABNORMAL HIGH (ref 70–99)
Glucose-Capillary: 157 mg/dL — ABNORMAL HIGH (ref 70–99)
Glucose-Capillary: 186 mg/dL — ABNORMAL HIGH (ref 70–99)

## 2014-04-02 NOTE — Progress Notes (Signed)
Patient ID: Mary [name], female   DOB: 1940-05-11, 74 y.o.   MRN: [id number] PATIENT ID: Mary Blendlsa Nanni  MRN: [id number]  DOB/AGE:  1940-05-11 / 74 y.o.  1 Day Post-Op Procedure(s) (LRB): RIGHT TOTAL KNEE ARTHROPLASTY (Right)    PROGRESS NOTE Subjective: Patient is alert, oriented, x1 Nausea, no Vomiting, yes passing gas, no Bowel Movement. Taking PO well. Denies SOB, Chest or Calf Pain. Using Incentive Spirometer, PAS in place. Ambulate WBAT, CPM 0-40 Patient reports pain as 7 on 0-10 scale  .    Objective: Vital signs in last 24 hours: Filed Vitals:   04/02/14 0000 04/02/14 0049 04/02/14 0400 04/02/14 0503  BP:  137/76  152/81  Pulse:  88  84  Temp:  100 F (37.8 C)  98.8 F (37.1 C)  TempSrc:  Oral  Oral  Resp: 16 16 16 20   Height:      Weight:      SpO2: 95% 93% 96% 93%      Intake/Output from previous day: I/O last 3 completed shifts: In: 1600 [I.V.:1600] Out: 50 [Blood:50]   Intake/Output this shift:     LABORATORY DATA:  Recent Labs  04/01/14 0744 04/01/14 1122 04/01/14 2009  GLUCAP 143* 105* 168*    Examination: Neurologically intact ABD soft Neurovascular intact Sensation intact distally Intact pulses distally Dorsiflexion/Plantar flexion intact Incision: dressing C/D/I No cellulitis present Compartment soft}  Assessment:   1 Day Post-Op Procedure(s) (LRB): RIGHT TOTAL KNEE ARTHROPLASTY (Right) ADDITIONAL DIAGNOSIS: Expected Acute Blood Loss Anemia, Diabetes and Hypertension  Plan: PT/OT WBAT, CPM 5/hrs day until ROM 0-90 degrees, then D/C CPM DVT Prophylaxis:  SCDx72hrs, ASA 325 mg BID x 2 weeks DISCHARGE PLAN: Home DISCHARGE NEEDS: HHPT, HHRN, CPM, Walker and 3-in-1 comode seat     Kianna Billet J 04/02/2014, 6:21 AM

## 2014-04-02 NOTE — Evaluation (Signed)
Physical Therapy Evaluation Patient Details Name: Mary [name] MRN: [id number] DOB: 04-10-1940 Today's Date: 04/02/2014   History of Present Illness  74 y.o. female admitted to [facility] on 04/01/14 for elective R TKA.  Pt with significant PMHx of DM, CABG, back surgery (1992), L carpal tunnel release, and B eye surgery for glaucoma.   Clinical Impression  Pt is POD #1 and is limited by pain and weakness in her right knee.  She was able with min assist to make it to the recliner chair with short, in- room gait.  She will benefit from HHPT at discharge and 24/7 assist.  PT will continue to follow acutely.     Follow Up Recommendations Home health PT;Supervision for mobility/OOB    Equipment Recommendations  Rolling walker with 5" wheels    Recommendations for Other Services   NA    Precautions / Restrictions Precautions Precautions: Knee Restrictions RLE Weight Bearing: Weight bearing as tolerated      Mobility  Bed Mobility Overal bed mobility: Needs Assistance Bed Mobility: Supine to Sit     Supine to sit: Min assist;HOB elevated     General bed mobility comments: Min assist to help progress her right leg over EOB.  Pt needed significant extra time to get trunk up to sitting with HOB maximally elevated.  Pt using bed rail for leverage of trunk and still needed therapist's external assist as well.   Transfers Overall transfer level: Needs assistance Equipment used: Rolling walker (2 wheeled) Transfers: Sit to/from Stand Sit to Stand: Min assist;From elevated surface         General transfer comment: Min assist to support trunk during transition to stand.  Pt using both hands on RW, so therapist having to stabilize RW as well.  Not responding well to verbal cues due to limited understanding of English.    Ambulation/Gait Ambulation/Gait assistance: Min assist Ambulation Distance (Feet): 10 Feet Assistive device: Rolling walker (2 wheeled) Gait Pattern/deviations: Step-to  pattern;Antalgic;Trunk flexed     General Gait Details: Pt with moderately antalgic gait pattern and poor knee control (buckling and flexed during stance on the right).  Pt needed assist at trunk when WB on right foot during gait and to help steer and stabilize the RW.           Balance Overall balance assessment: Needs assistance Sitting-balance support: Feet supported;No upper extremity supported Sitting balance-Leahy Scale: Good     Standing balance support: Bilateral upper extremity supported Standing balance-Leahy Scale: Poor                               Pertinent Vitals/Pain Pain Assessment: Faces Faces Pain Scale: Hurts whole lot Pain Location: right knee Pain Descriptors / Indicators: Aching Pain Intervention(s): Limited activity within patient's tolerance;Monitored during session;Repositioned    Home Living Family/patient expects to be discharged to:: Private residence Living Arrangements: Children;Other relatives Available Help at Discharge: Family;Available 24 hours/day Type of Home: Apartment Home Access: Stairs to enter Entrance Stairs-Rails: None Entrance Stairs-Number of Steps: 4 Home Layout: One level Home Equipment: None            Hand Dominance   Dominant Hand: Right    Extremity/Trunk Assessment   Upper Extremity Assessment: Defer to OT evaluation           Lower Extremity Assessment: RLE deficits/detail RLE Deficits / Details: right leg with normal post op pain and weakness.  Pt with 3-/5 ankle,  2/5 knee, 2/5 hip flexion    Cervical / Trunk Assessment: Other exceptions  Communication   Communication: Prefers language other than English (Spanish)  Cognition Arousal/Alertness: Awake/alert Behavior During Therapy: WFL for tasks assessed/performed Overall Cognitive Status: Within Functional Limits for tasks assessed                               Assessment/Plan    PT Assessment Patient needs continued PT  services  PT Diagnosis Difficulty walking;Abnormality of gait;Generalized weakness;Acute pain   PT Problem List Decreased strength;Decreased range of motion;Decreased activity tolerance;Decreased balance;Decreased mobility;Decreased knowledge of use of DME;Decreased knowledge of precautions;Pain  PT Treatment Interventions DME instruction;Gait training;Stair training;Functional mobility training;Therapeutic activities;Therapeutic exercise;Balance training;Neuromuscular re-education;Patient/family education;Manual techniques;Modalities   PT Goals (Current goals can be found in the Care Plan section) Acute Rehab PT Goals Patient Stated Goal: to go home at discharge PT Goal Formulation: With patient Time For Goal Achievement: 04/09/14 Potential to Achieve Goals: Good    Frequency 7X/week    End of Session   Activity Tolerance: Patient limited by pain Patient left: in chair;with call bell/phone within reach           Time: 1234-1254 PT Time Calculation (min) (ACUTE ONLY): 20 min   Charges:   PT Evaluation $Initial PT Evaluation Tier I: 1 Procedure          Jaszmine Navejas B. Naoki Migliaccio, PT, DPT [phone]   04/02/2014, 4:41 PM

## 2014-04-02 NOTE — Progress Notes (Signed)
Physical Therapy Treatment Patient Details Name: Mary [name] MRN: [id number] DOB: Jan 16, 1940 Today's Date: 04/02/2014    History of Present Illness 74 y.o. female admitted to [facility] on 04/01/14 for elective R TKA.  Pt with significant PMHx of DM, CABG, back surgery (1992), L carpal tunnel release, and B eye surgery for glaucoma.     PT Comments    Pt is POD #1 and this is her second session.  Her reported pain was better, however, she did significantly worse with her mobility and was unable to take steps forward away from the bed with therapist's assist and the RW.  She is at this time looking more like an appropriate candidate for SNF level rehab at discharge instead of home.  She is VERY guarded in her flexion and I had to turn her CPM down to 35 degrees and even then she was asking to get out of it.  PT will continue to follow acutely to help progress mobility, but may need to bring +2 assistance tomorrow.   Follow Up Recommendations  SNF     Equipment Recommendations  Rolling walker with 5" wheels    Recommendations for Other Services   NA     Precautions / Restrictions Precautions Precautions: Knee Restrictions RLE Weight Bearing: Weight bearing as tolerated    Mobility  Bed Mobility Overal bed mobility: Needs Assistance Bed Mobility: Supine to Sit;Sit to Supine     Supine to sit: Min assist;HOB elevated Sit to supine: Mod assist   General bed mobility comments: Min assist to support right leg and then help to weight shift hips to get to EOB.  HOB maximally elevated and pt pulling with bil upper extremities on bed rail to support trunk.  Tendancy towards posterior LOB until her feet were supported. Mod assist to help lift both legs back into the bed from sitting.   Transfers Overall transfer level: Needs assistance Equipment used: Rolling walker (2 wheeled) Transfers: Sit to/from Stand Sit to Stand: Mod assist;From elevated surface         General transfer comment:  Mod assist to support trunk to get to standing from elevated bed.  Pt needed verbal cues for hand placement and prefers both hands on RW, so therapist had to stabilize the RW as well.    Ambulation/Gait Ambulation/Gait assistance: Mod assist Ambulation Distance (Feet): 10 Feet Assistive device: Rolling walker (2 wheeled) Gait Pattern/deviations: Step-to pattern;Antalgic;Trunk flexed     General Gait Details: Attempted forward steps and side steps at EOB and pt unable to complete this afternoon with RW.  She was unable to put enough force through her arms to unweight her painful and weak right leg to take any steps forward.            Balance Overall balance assessment: Needs assistance Sitting-balance support: Feet supported;No upper extremity supported;Feet unsupported;Bilateral upper extremity supported Sitting balance-Leahy Scale: Poor Sitting balance - Comments: min assist to prevent posterior LOB until bil feet were supported on the ground Postural control: Posterior lean Standing balance support: Bilateral upper extremity supported Standing balance-Leahy Scale: Poor Standing balance comment: Needs support from bed, therapist and RW to stand.                    Cognition Arousal/Alertness: Awake/alert Behavior During Therapy: WFL for tasks assessed/performed Overall Cognitive Status: Within Functional Limits for tasks assessed                      Exercises  Total Joint Exercises Ankle Circles/Pumps: AROM;AAROM;Both;10 reps;Supine Heel Slides: AAROM;Right;10 reps;Supine (very guarded at end ROM and very limited ROM) Hip ABduction/ADduction: AAROM;Right;10 reps;Supine Straight Leg Raises: AROM;Right;10 reps;Supine Goniometric ROM: 10-30 degrees AAROM in supine        Pertinent Vitals/Pain Pain Assessment: 0-10 Pain Score: 5  Faces Pain Scale: Hurts whole lot Pain Location: right knee Pain Descriptors / Indicators: Aching;Burning Pain Intervention(s):  Limited activity within patient's tolerance;Monitored during session;Repositioned;Patient requesting pain meds-RN notified (RN gave IV pain meds at end of session)    Home Living Family/patient expects to be discharged to:: Private residence Living Arrangements: Children;Other relatives Available Help at Discharge: Family;Available 24 hours/day Type of Home: Apartment Home Access: Stairs to enter Entrance Stairs-Rails: None Home Layout: One level Home Equipment: None          PT Goals (current goals can now be found in the care plan section) Acute Rehab PT Goals Patient Stated Goal: to go home at discharge PT Goal Formulation: With patient Time For Goal Achievement: 04/09/14 Potential to Achieve Goals: Good Progress towards PT goals: Not progressing toward goals - comment (limited by pain, weakness, and fatigue)    Frequency  7X/week    PT Plan Discharge plan needs to be updated       End of Session   Activity Tolerance: Patient limited by fatigue;Patient limited by pain Patient left: in bed;with call bell/phone within reach;with bed alarm set     Time: [id number] PT Time Calculation (min) (ACUTE ONLY): 27 min  Charges:  $Therapeutic Exercise: 8-22 mins $Therapeutic Activity: 8-22 mins                      Hilary Milks B. Moises Terpstra, PT, DPT [phone]   04/02/2014, 5:45 PM

## 2014-04-02 NOTE — Progress Notes (Signed)
Orthopedic Tech Progress Note Patient Details:  Mary [name] [birth year] [id number] Patient working with therapy at this time Patient ID: Mary [name], female   DOB: [birth year], 74 y.o.   MRN: [id number]   Mary [name] 04/02/2014, 3:08 PM

## 2014-04-03 LAB — CBC
HEMATOCRIT: 34.6 % — AB (ref 36.0–46.0)
Hemoglobin: 11.6 g/dL — ABNORMAL LOW (ref 12.0–15.0)
MCH: 29.4 pg (ref 26.0–34.0)
MCHC: 33.5 g/dL (ref 30.0–36.0)
MCV: 87.6 fL (ref 78.0–100.0)
PLATELETS: 213 10*3/uL (ref 150–400)
RBC: 3.95 MIL/uL (ref 3.87–5.11)
RDW: 12.5 % (ref 11.5–15.5)
WBC: 13 10*3/uL — ABNORMAL HIGH (ref 4.0–10.5)

## 2014-04-03 LAB — HEMOGLOBIN A1C
Hgb A1c MFr Bld: 6.6 % — ABNORMAL HIGH (ref 4.8–5.6)
Mean Plasma Glucose: 143 mg/dL

## 2014-04-03 LAB — GLUCOSE, CAPILLARY
GLUCOSE-CAPILLARY: 167 mg/dL — AB (ref 70–99)
Glucose-Capillary: 161 mg/dL — ABNORMAL HIGH (ref 70–99)
Glucose-Capillary: 168 mg/dL — ABNORMAL HIGH (ref 70–99)
Glucose-Capillary: 142 mg/dL — ABNORMAL HIGH (ref 70–99)

## 2014-04-03 MED ORDER — POLYETHYLENE GLYCOL 3350 17 G PO PACK
17.0000 g | PACK | Freq: Every day | ORAL | Status: DC | PRN
  Administered 2014-04-03: 17 g via ORAL
  Filled 2014-04-03: qty 1

## 2014-04-03 NOTE — Progress Notes (Signed)
Physical Therapy Treatment Patient Details Name: Mary [name] MRN: [id number] DOB: [birth year] Today's Date: 04/03/2014    History of Present Illness 74 y.o. female [facility] 04[facility] for elective R TKA.  Pt with significant PMHx of DM, CABG, back surgery (1992), L carpal tunnel release, and B eye surgery for glaucoma.     PT Comments    Pt is POD #2 and this is session #2.  She is continuing to make steady progress with her mobility, but does continue to need constant hands on assist due to high fall risk.  If 24/7 physical assist can be confirmed by family she will be safe to go home with HHPT at discharge.  She will need to practice the stairs before discharge home.    Follow Up Recommendations  Home health PT;Supervision/Assistance - 24 hour     Equipment Recommendations  Rolling walker with 5" wheels    Recommendations for Other Services       Precautions / Restrictions Precautions Precautions: Knee Precaution Comments: reviewed no pillow under knee Restrictions RLE Weight Bearing: Weight bearing as tolerated    Mobility  Bed Mobility Overal bed mobility: Needs Assistance Bed Mobility: Sit to Supine       Sit to supine: Min assist   General bed mobility comments: Min assist to support right leg during transition from sit to supine.  Pt using bed rail to help control descent of trunk.   Transfers Overall transfer level: Needs assistance Equipment used: Rolling walker (2 wheeled) Transfers: Sit to/from Stand Sit to Stand: Min assist         General transfer comment: Min assist from lower recliner with heavy reliance on upper extremity support and assist needed for balance at trunk during transition from hands on recliner to hands on RW.    Ambulation/Gait Ambulation/Gait assistance: Min assist Ambulation Distance (Feet): 75 Feet Assistive device: Rolling walker (2 wheeled) Gait Pattern/deviations: Step-to pattern;Antalgic;Trunk flexed     General  Gait Details: Pt with less instability at her knee during gait this PM.  Doing better at self correcting her posture.  She pushed herself to go further than she went during the AM session.     Stairs            Wheelchair Mobility    Modified Rankin (Stroke Patients Only)       Balance Overall balance assessment: Needs assistance Sitting-balance support: Feet supported;No upper extremity supported Sitting balance-Leahy Scale: Good     Standing balance support: Bilateral upper extremity supported Standing balance-Leahy Scale: Poor                      Cognition Arousal/Alertness: Awake/alert Behavior During Therapy: WFL for tasks assessed/performed Overall Cognitive Status: Within Functional Limits for tasks assessed                      Exercises Total Joint Exercises Ankle Circles/Pumps: AROM;Both;10 reps;Supine Quad Sets: AROM;Right;10 reps;Supine Towel Squeeze: AROM;Both;10 reps;Supine Short Arc Quad: AAROM;Right;10 reps;Supine Heel Slides: AAROM;Right;10 reps;Supine Hip ABduction/ADduction: AAROM;Right;10 reps;Supine Straight Leg Raises: AAROM;Right;10 reps;Supine Goniometric ROM: 12-50 AAROM reclined in chair    General Comments General comments (skin integrity, edema, etc.): Discussed with pt today via interpreter, Mary [name] (arranged through the social work department) pt's family assistance, home situation, and 24/7 help at home and her preference is to go home with her family's support.  She continues to make slow progress, but is making progress towards goals.  Pertinent Vitals/Pain Pain Assessment: 0-10 Pain Score: 5  Pain Location: right knee Pain Descriptors / Indicators: Aching Pain Intervention(s): Limited activity within patient's tolerance;Monitored during session;Repositioned    Home Living                      Prior Function            PT Goals (current goals can now be found in the care plan section) Acute  Rehab PT Goals Patient Stated Goal: go home Progress towards PT goals: Progressing toward goals    Frequency  7X/week    PT Plan Discharge plan needs to be updated    Co-evaluation PT/OT/SLP Co-Evaluation/Treatment: Yes Reason for Co-Treatment: For patient/therapist safety;Other (comment) (availablity of interpreter. ) PT goals addressed during session: Mobility/safety with mobility;Balance;Proper use of DME;Strengthening/ROM       End of Session   Activity Tolerance: Patient limited by fatigue;Patient limited by pain Patient left: in bed;with call bell/phone within reach;in CPM     Time: 1610-9604 PT Time Calculation (min) (ACUTE ONLY): 20 min  Charges:  $Gait Training: 8-22 mins                    G Codes:      Kaelem Brach B. Bodee Lafoe, PT, DPT [phone]   04/03/2014, 9:48 PM

## 2014-04-03 NOTE — Progress Notes (Signed)
PATIENT ID: Mary [name]  MRN: [id number]  DOB/AGE:  1940/11/17 / 74 y.o.  2 Days Post-Op Procedure(s) (LRB): RIGHT TOTAL KNEE ARTHROPLASTY (Right)    PROGRESS NOTE Subjective: Patient is alert, oriented, no Nausea, no Vomiting, yes passing gas, no Bowel Movement. Taking PO well with pt up and eating. Denies SOB, Chest or Calf Pain. Using Incentive Spirometer, PAS in place. Ambulate WBAT, CPM 0-40 Patient reports pain as mild and moderate  .    Objective: Vital signs in last 24 hours: Filed Vitals:   04/02/14 1959 04/02/14 2000 04/03/14 0000 04/03/14 0442  BP: 146/74   130/55  Pulse: 86   99  Temp: 98.6 F (37 C)   98.3 F (36.8 C)  TempSrc:      Resp: 18 18 18 18   Height:      Weight:      SpO2: 95%   93%      Intake/Output from previous day: I/O last 3 completed shifts: In: 360 [P.O.:360] Out: -    Intake/Output this shift:     LABORATORY DATA:  Recent Labs  04/02/14 0520  04/02/14 1637 04/02/14 2227 04/03/14 0640  WBC 13.4*  --   --   --   --   HGB 12.3  --   --   --   --   HCT 37.4  --   --   --   --   PLT 269  --   --   --   --   GLUCAP  --   < > 157* 186* 167*  < > = values in this interval not displayed.  Examination: Neurologically intact Neurovascular intact Sensation intact distally Intact pulses distally Dorsiflexion/Plantar flexion intact Incision: dressing C/D/I No cellulitis present Compartment soft}  Assessment:   2 Days Post-Op Procedure(s) (LRB): RIGHT TOTAL KNEE ARTHROPLASTY (Right) ADDITIONAL DIAGNOSIS: Expected Acute Blood Loss Anemia, Diabetes and Hypertension  Plan: PT/OT WBAT, CPM 5/hrs day until ROM 0-90 degrees, then D/C CPM DVT Prophylaxis:  SCDx72hrs, ASA 325 mg BID x 2 weeks DISCHARGE PLAN: Home, when she passes therapy goals.   DISCHARGE NEEDS: HHPT, HHRN, CPM, Walker and 3-in-1 comode seat     Mary [name] R 04/03/2014, 7:48 AM

## 2014-04-03 NOTE — Progress Notes (Signed)
Physical Therapy Treatment Patient Details Name: Mary [name] MRN: [id number] DOB: 05-12-1940 Today's Date: 04/03/2014    History of Present Illness 74 y.o. female admitted to [facility] on 04/01/14 for elective R TKA.  Pt with significant PMHx of DM, CABG, back surgery (1992), L carpal tunnel release, and B eye surgery for glaucoma.     PT Comments    Pt is POD #2 and is making slow, but measurable progress.  She was able to ambulate into the hallway today.  She continues to be very guarded with knee flexion ROM.  PT/OT had the Spanish interpreter present for our session today and this helped clarify home situation and safety cues.  Pt continues to be appropriate for SNF level rehab, however, her preference is home with her family who she states will provide 24/7 assist at discharge.  She is a very high fall risk and it would be beneficial to have the family present for education prior to d/c ing pt home with them. PT will continue to follow acutely.   Follow Up Recommendations  SNF (pt continues to be appropriate, but will decline SNF)     Equipment Recommendations  Rolling walker with 5" wheels    Recommendations for Other Services       Precautions / Restrictions Precautions Precautions: Knee Precaution Comments: reviewed no pillow under knee Restrictions RLE Weight Bearing: Weight bearing as tolerated    Mobility  Bed Mobility                  Transfers Overall transfer level: Needs assistance Equipment used: Rolling walker (2 wheeled) Transfers: Sit to/from Stand Sit to Stand: +2 physical assistance;Mod assist         General transfer comment: Two person mod assist at trunk from lower bed.  Assist and cues for LE positioning.  Verbal cues for safe hand placement.  Ambulation/Gait Ambulation/Gait assistance: +2 safety/equipment;Min assist Ambulation Distance (Feet): 60 Feet Assistive device: Rolling walker (2 wheeled) Gait Pattern/deviations: Step-to  pattern;Antalgic;Trunk flexed     General Gait Details: Pt with moderately antalgc gait pattern with flexed, buckling knee in stance.  Better after a few steps forward.  Verbal cues for proximity to RW, upright posture and correct LE sequencing.  Pt's arms fatigue quickly.    Stairs            Wheelchair Mobility    Modified Rankin (Stroke Patients Only)       Balance Overall balance assessment: Needs assistance Sitting-balance support: Feet supported;Bilateral upper extremity supported Sitting balance-Leahy Scale: Fair     Standing balance support: Bilateral upper extremity supported Standing balance-Leahy Scale: Poor                      Cognition Arousal/Alertness: Awake/alert Behavior During Therapy: WFL for tasks assessed/performed Overall Cognitive Status: Within Functional Limits for tasks assessed                      Exercises Total Joint Exercises Ankle Circles/Pumps: AROM;Both;10 reps;Supine Quad Sets: AROM;Right;10 reps;Supine Towel Squeeze: AROM;Both;10 reps;Supine Short Arc Quad: AAROM;Right;10 reps;Supine Heel Slides: AAROM;Right;10 reps;Supine Goniometric ROM: 12-50 AAROM reclined in chair    General Comments General comments (skin integrity, edema, etc.): Discussed with pt today via interpreter, Dori (arranged through the social work department) pt's family assistance, home situation, and 24/7 help at home and her preference is to go home with her family's support.  She continues to make slow progress, but is making progress  towards goals.        Pertinent Vitals/Pain Pain Assessment: 0-10 Pain Score: 4  Pain Location: right knee Pain Descriptors / Indicators: Aching;Burning Pain Intervention(s): Limited activity within patient's tolerance;Monitored during session;Repositioned    Home Living                      Prior Function            PT Goals (current goals can now be found in the care plan section) Acute  Rehab PT Goals Patient Stated Goal: go home Progress towards PT goals: Progressing toward goals    Frequency  7X/week    PT Plan Current plan remains appropriate    Co-evaluation PT/OT/SLP Co-Evaluation/Treatment: Yes Reason for Co-Treatment: For patient/therapist safety;Other (comment) (availablity of interpreter. ) PT goals addressed during session: Mobility/safety with mobility;Balance;Proper use of DME;Strengthening/ROM       End of Session   Activity Tolerance: Patient limited by fatigue;Patient limited by pain Patient left: in chair;with call bell/phone within reach;with chair alarm set     Time: 1121-1150 PT Time Calculation (min) (ACUTE ONLY): 29 min  Charges:  $Gait Training: 8-22 mins                    G Codes:      Alera Quevedo B. Riyan Haile, PT, DPT [phone]   04/03/2014, 9:22 PM

## 2014-04-03 NOTE — Progress Notes (Signed)
Orthopedic Tech Progress Note Patient Details:  Mary [name] April 07, 1940 [id number] On cpm at 7:20 pm increased to 50 degrees. Patient ID: Mary [name], female   DOB: April 07, 1940, 74 y.o.   MRN: [id number]   Jennye MoccasinHughes, Narmeen Kerper Craig 04/03/2014, 7:17 PM

## 2014-04-03 NOTE — Evaluation (Signed)
Occupational Therapy Evaluation Patient Details Name: Mary [name] MRN: [id number] DOB: [birth year] Today's Date: 04/03/2014    History of Present Illness 74 y.o. female admitted to [facility] on 04/01/14 for elective R TKA.  Pt with significant PMHx of DM, CABG, back surgery (1992), L carpal tunnel release, and B eye surgery for glaucoma.    Clinical Impression   Patient independent PTA. Patient currently requires up to total assist for LB ADLs, set-up > min guard for UB ADLs seated EOB, min>mod assist for sit<>stands; +2 needed for safety of patient and therapist during OT eval. Patient will benefit from acute OT to increase overall independence in the areas of ADLs, functional mobility, and overall safety in order to safely discharge to venue listed below.     Follow Up Recommendations  SNF;Supervision/Assistance - 24 hour    Equipment Recommendations   (TBD)    Recommendations for Other Services  None at this time   Precautions / Restrictions Precautions Precautions: Knee Precaution Comments: reviewed no pillow under knee Restrictions Weight Bearing Restrictions: Yes RLE Weight Bearing: Weight bearing as tolerated      Mobility Bed Mobility Overal bed mobility: Needs Assistance Bed Mobility: Rolling;Sidelying to Sit   Sidelying to sit: Min assist Supine to sit: Min assist;HOB elevated     General bed mobility comments: heavily using rails, patient with left lean when attempting to sit EOB. Patient guarding right knee in abducted and extended position  Transfers Overall transfer level: Needs assistance Equipment used: Rolling walker (2 wheeled) Transfers: Sit to/from Stand Sit to Stand: Mod assist;+2 physical assistance         General transfer comment: Cues for safety, hand placement, and posture during sit<>stand    Balance Overall balance assessment: Needs assistance Sitting-balance support: Bilateral upper extremity supported;Feet supported Sitting balance-Leahy  Scale: Poor     Standing balance support: Bilateral upper extremity supported;During functional activity Standing balance-Leahy Scale: Poor    ADL Overall ADL's : Needs assistance/impaired Eating/Feeding: Set up;Sitting   Grooming: Set up;Sitting   Upper Body Bathing: Set up;Sitting   Lower Body Bathing: Total assistance;Sit to/from stand;Cueing for safety   Upper Body Dressing : Set up;Sitting   Lower Body Dressing: Sit to/from stand;Cueing for safety;Total assistance   Toilet Transfer: +2 for safety/equipment;Moderate assistance;RW           Functional mobility during ADLs: Minimal assistance;Moderate assistance;Rolling walker;+2 for safety/equipment General ADL Comments: Interpreter needed secondary to language barrier. Patient with pain in abdomen secondary to "gas" per patient. Patient eager and willing to participate in therapy. Patient with increased pain when trying to flex right knee. Patient unable to reach BLEs for LB ADLs. +2 for safety at this time, with patient requiring min>mod assist for sit<>stands.     Pertinent Vitals/Pain Pain Assessment: 0-10 Pain Score: 6  Pain Location: stomach (pt reports pain as a "gas pain") Pain Descriptors / Indicators: Discomfort Pain Intervention(s): Monitored during session     Hand Dominance Right   Extremity/Trunk Assessment Upper Extremity Assessment Upper Extremity Assessment: Overall WFL for tasks assessed   Lower Extremity Assessment Lower Extremity Assessment: Defer to PT evaluation   Cervical / Trunk Assessment Cervical / Trunk Exceptions: h/o back surgery per chart   Communication Communication Communication: Prefers language other than English (spanish, interpreter present for OT eval)   Cognition Arousal/Alertness: Awake/alert Behavior During Therapy: WFL for tasks assessed/performed Overall Cognitive Status: Within Functional Limits for tasks assessed  Home Living Family/patient  expects to be discharged to:: Private residence Living Arrangements: Children;Other relatives (two sons and two grandchildren) Available Help at Discharge: Family;Available 24 hours/day Type of Home: Apartment (2nd floor) Home Access: Stairs to enter Entrance Stairs-Number of Steps: 7   Home Layout: One level     Bathroom Shower/Tub: IT trainerTub/shower unit;Curtain   Bathroom Toilet: Standard     Home Equipment: None        OT Diagnosis: Generalized weakness;Acute pain   OT Problem List: Decreased strength;Decreased range of motion;Decreased activity tolerance;Impaired balance (sitting and/or standing);Decreased safety awareness;Decreased knowledge of use of DME or AE;Decreased knowledge of precautions;Pain   OT Treatment/Interventions: Self-care/ADL training;Therapeutic exercise;Energy conservation;DME and/or AE instruction;Therapeutic activities;Patient/family education;Balance training    OT Goals(Current goals can be found in the care plan section) Acute Rehab OT Goals Patient Stated Goal: go home OT Goal Formulation: With patient Time For Goal Achievement: 04/10/14 Potential to Achieve Goals: Good ADL Goals Pt Will Perform Grooming: standing;with min guard assist Pt Will Perform Lower Body Bathing: with mod assist;sit to/from stand;with adaptive equipment Pt Will Perform Lower Body Dressing: with mod assist;sit to/from stand;with adaptive equipment Pt Will Transfer to Toilet: with min assist;ambulating;bedside commode Pt Will Perform Tub/Shower Transfer: Tub transfer;with min assist;ambulating;3 in 1;rolling walker  OT Frequency: Min 2X/week   Barriers to D/C: Decreased caregiver support  Unsure if patient will have needed 24/7 supervision/assist. Patient states her son will be there, but with a 2 yo and 8yo.           End of Session Equipment Utilized During Treatment: Gait belt;Rolling walker CPM Right Knee CPM Right Knee: Off  Activity Tolerance: Patient tolerated  treatment well Patient left: Other (comment) (ambulating with PT)   Time: [id number] OT Time Calculation (min): 28 min Charges:  OT General Charges $OT Visit: 1 Procedure OT Evaluation $Initial OT Evaluation Tier I: 1 Procedure OT Treatments $Self Care/Home Management : 8-22 mins  Gustabo Gordillo , MS, OTR/L, CLT Pager: [phone]  04/03/2014, 11:48 AM

## 2014-04-04 DIAGNOSIS — M150 Primary generalized (osteo)arthritis: Secondary | ICD-10-CM | POA: Diagnosis not present

## 2014-04-04 DIAGNOSIS — R262 Difficulty in walking, not elsewhere classified: Secondary | ICD-10-CM | POA: Diagnosis not present

## 2014-04-04 DIAGNOSIS — I10 Essential (primary) hypertension: Secondary | ICD-10-CM | POA: Diagnosis not present

## 2014-04-04 DIAGNOSIS — E119 Type 2 diabetes mellitus without complications: Secondary | ICD-10-CM | POA: Diagnosis not present

## 2014-04-04 DIAGNOSIS — M6281 Muscle weakness (generalized): Secondary | ICD-10-CM | POA: Diagnosis not present

## 2014-04-04 LAB — CBC
HEMATOCRIT: 34.2 % — AB (ref 36.0–46.0)
Hemoglobin: 11.1 g/dL — ABNORMAL LOW (ref 12.0–15.0)
MCH: 28.8 pg (ref 26.0–34.0)
MCHC: 32.5 g/dL (ref 30.0–36.0)
MCV: 88.6 fL (ref 78.0–100.0)
PLATELETS: 211 10*3/uL (ref 150–400)
RBC: 3.86 MIL/uL — AB (ref 3.87–5.11)
RDW: 12.5 % (ref 11.5–15.5)
WBC: 14.8 10*3/uL — ABNORMAL HIGH (ref 4.0–10.5)

## 2014-04-04 LAB — GLUCOSE, CAPILLARY: GLUCOSE-CAPILLARY: 162 mg/dL — AB (ref 70–99)

## 2014-04-04 NOTE — Progress Notes (Signed)
Patient ID: Mary [name], female   DOB: [birth year], 74 y.o.   MRN: [id number] PATIENT ID: Mary [name]  MRN: [id number]  DOB/AGE:  [birth year] / 74 y.o.  3 Days Post-Op Procedure(s) (LRB): RIGHT TOTAL KNEE ARTHROPLASTY (Right)    PROGRESS NOTE Subjective: Patient is alert, oriented, no Nausea, no Vomiting, yes passing gas, x1 Bowel Movement. Taking PO well. Denies SOB, Chest or Calf Pain. Using Incentive Spirometer, PAS in place. Ambulate WBAT 75 ft Patient reports pain as 3 on 0-10 scale  .    Objective: Vital signs in last 24 hours: Filed Vitals:   04/03/14 2000 04/03/14 2013 04/04/14 0000 04/04/14 0506  BP:  120/55  104/42  Pulse:  101  98  Temp:  98.4 F (36.9 C)  98.7 F (37.1 C)  TempSrc:      Resp: 16 16 17 16   Height:      Weight:      SpO2:  92%  93%      Intake/Output from previous day: I/O last 3 completed shifts: In: 800 [P.O.:800] Out: -    Intake/Output this shift:     LABORATORY DATA:  Recent Labs  04/03/14 0725  04/03/14 1644 04/03/14 2134 04/04/14 0612 04/04/14 0635  WBC 13.0*  --   --   --  14.8*  --   HGB 11.6*  --   --   --  11.1*  --   HCT 34.6*  --   --   --  34.2*  --   PLT 213  --   --   --  211  --   GLUCAP  --   < > 168* 142*  --  162*  < > = values in this interval not displayed.  Examination: Neurologically intact ABD soft Neurovascular intact Sensation intact distally Intact pulses distally Dorsiflexion/Plantar flexion intact Incision: dressing C/D/I No cellulitis present Compartment soft} XR AP&Lat of hip shows well placed\fixed THA  Assessment:   3 Days Post-Op Procedure(s) (LRB): RIGHT TOTAL KNEE ARTHROPLASTY (Right) ADDITIONAL DIAGNOSIS:  Expected Acute Blood Loss Anemia, DM, HTN  Plan: PT/OT WBAT, THA  posterior precautions  DVT Prophylaxis: SCDx72 hrs, ASA 325 mg BID x 2 weeks  DISCHARGE PLAN: Home, when passes.  DISCHARGE NEEDS: HHPT, HHRN, CPM, Walker and 3-in-1 comode seat

## 2014-04-04 NOTE — Discharge Summary (Signed)
Patient ID: Mary [name] MRN: [id number] DOB/AGE: 1940-10-28 74 y.o.  Admit date: 04/01/2014 Discharge date: 04/04/2014  Admission Diagnoses:  Principal Problem:   Primary osteoarthritis of right knee   Discharge Diagnoses:  Same  Past Medical History  Diagnosis Date  . Hyperlipemia   . Diabetes mellitus without complication     DM II  . Arthritis   . H/O transfusion     Surgeries: Procedure(s): RIGHT TOTAL KNEE ARTHROPLASTY on 04/01/2014   Consultants:    Discharged Condition: Improved  Hospital Course: Kristy Schomburg is an 74 y.o. female who was admitted 04/01/2014 for operative treatment ofPrimary osteoarthritis of right knee. Patient has severe unremitting pain that affects sleep, daily activities, and work/hobbies. After pre-op clearance the patient was taken to the operating room on 04/01/2014 and underwent  Procedure(s): RIGHT TOTAL KNEE ARTHROPLASTY.    Patient was given perioperative antibiotics: Anti-infectives    Start     Dose/Rate Route Frequency Ordered Stop   04/01/14 0936  cefUROXime (ZINACEF) injection  Status:  Discontinued       As needed 04/01/14 0936 04/01/14 1054   04/01/14 0854  ceFAZolin (ANCEF) 2-3 GM-% IVPB SOLR    Comments:  Scronce, Trina   : cabinet override      04/01/14 0854 04/01/14 0858   03/31/14 1133  ceFAZolin (ANCEF) IVPB 2 g/50 mL premix     2 g 100 mL/hr over 30 Minutes Intravenous On call to O.R. 03/31/14 1133 04/01/14 0559       Patient was given sequential compression devices, early ambulation, and chemoprophylaxis to prevent DVT.  Patient benefited maximally from hospital stay and there were no complications.    Recent vital signs: Patient Vitals for the past 24 hrs:  BP Temp Temp src Pulse Resp SpO2  04/04/14 0506 (!) 104/42 mmHg 98.7 F (37.1 C) - 98 16 93 %  04/04/14 0000 - - - - 17 -  04/03/14 2013 (!) 120/55 mmHg 98.4 F (36.9 C) - (!) 101 16 92 %  04/03/14 2000 - - - - 16 -  04/03/14 1513 115/88 mmHg 99.4 F (37.4 C)  Oral (!) 116 16 93 %     Recent laboratory studies:  Recent Labs  04/03/14 0725 04/04/14 0612  WBC 13.0* 14.8*  HGB 11.6* 11.1*  HCT 34.6* 34.2*  PLT 213 211     Discharge Medications:     Medication List    TAKE these medications        amLODipine-benazepril 10-20 MG per capsule  Commonly known as:  LOTREL  Take 1 capsule by mouth daily.     aspirin EC 325 MG tablet  Take 1 tablet (325 mg total) by mouth 2 (two) times daily.     clopidogrel 75 MG tablet  Commonly known as:  PLAVIX  Take 75 mg by mouth daily.     fenofibrate 145 MG tablet  Commonly known as:  TRICOR  Take 145 mg by mouth daily.     OVER THE COUNTER MEDICATION  Take 1 tablet by mouth daily as needed (pain.). dicloplex forte df. Diclofenac , tiamina , piridoxina , cianocobalamina , riboflavina , excipientes 1 caplet     OVER THE COUNTER MEDICATION  Apply 1 application topically daily as needed (pain).     oxyCODONE-acetaminophen 5-325 MG per tablet  Commonly known as:  ROXICET  Take 1 tablet by mouth every 4 (four) hours as needed.     SitaGLIPtin-MetFORMIN HCl 50-1000 MG Tb24  Take 1 tablet by  mouth daily.     tizanidine 2 MG capsule  Commonly known as:  ZANAFLEX  Take 1 capsule (2 mg total) by mouth 3 (three) times daily.        Diagnostic Studies: Dg Chest 2 View  03/21/2014   CLINICAL DATA:  Preoperative evaluation  EXAM: CHEST  2 VIEW  COMPARISON:  None.  FINDINGS: Cardiac shadow is mildly enlarged. Postsurgical changes are seen. Coronary stent was noted. The lungs are clear bilaterally. No acute bony abnormality is seen.  IMPRESSION: No acute abnormality noted.   Electronically Signed   By: Alcide CleverMark  Lukens M.D.   On: 03/21/2014 14:49    Disposition: Final discharge disposition not confirmed      Discharge Instructions    CPM    Complete by:  As directed   Continuous passive motion machine (CPM):      Use the CPM from 0 to 60 for 5 hours per day.      You may  increase by 10 deg per day.  You may break it up into 2 or 3 sessions per day.      Use CPM for 2 weeks or until you are told to stop.     Call MD / Call 911    Complete by:  As directed   If you experience chest pain or shortness of breath, CALL 911 and be transported to the hospital emergency room.  If you develope a fever above 101 F, pus (white drainage) or increased drainage or redness at the wound, or calf pain, call your surgeon's office.     Change dressing    Complete by:  As directed   Change dressing on prn, then change the dressing daily with sterile 4 x 4 inch gauze dressing and apply TED hose.  You may clean the incision with alcohol prior to redressing.     Constipation Prevention    Complete by:  As directed   Drink plenty of fluids.  Prune juice may be helpful.  You may use a stool softener, such as Colace (over the counter) 100 mg twice a day.  Use MiraLax (over the counter) for constipation as needed.     Diet - low sodium heart healthy    Complete by:  As directed      Increase activity slowly as tolerated    Complete by:  As directed            Follow-up Information    Follow up with Nestor LewandowskyOWAN,Augie Vane J, MD In 2 weeks.   Specialty:  Orthopedic Surgery   Contact information:   [address] [place] [address] [phone]       Follow up with Nestor LewandowskyOWAN,Brylee Berk J, MD In 1 week.   Specialty:  Orthopedic Surgery   Contact information:   [address] [place] [address] [phone]        Signed: Nestor LewandowskyROWAN,Ellington Cornia J 04/04/2014, 7:59 AM

## 2014-04-04 NOTE — Progress Notes (Signed)
Physical Therapy Treatment Patient Details Name: Mary [name] MRN: [id number] DOB: 1940/08/22 Today's Date: 04/04/2014    History of Present Illness 74 y.o. female admitted to [facility] on 04/01/14 for elective R TKA.  Pt with significant PMHx of DM, CABG, back surgery (1992), L carpal tunnel release, and B eye surgery for glaucoma.     PT Comments    Pt is preparing for d/c today.  Family present for education and showed proficiency assisting pt with stairs.  HEP given in [place].  PT will continue to follow acutely until d/c.  Follow Up Recommendations  Home health PT;Supervision/Assistance - 24 hour     Equipment Recommendations  Rolling walker with 5" wheels    Recommendations for Other Services   NA     Precautions / Restrictions Precautions Precautions: Knee Precaution Comments: reviewed no pillow under operative knee with pt and her family.  Also, give TKA exercises for her to do written in Spanish.  Restrictions Weight Bearing Restrictions: Yes RLE Weight Bearing: Weight bearing as tolerated    Mobility  Bed Mobility                  Transfers Overall transfer level: Needs assistance Equipment used: Rolling walker (2 wheeled) Transfers: Sit to/from Stand Sit to Stand: Min guard         General transfer comment: Min guard assist for safety during transition of hands from hand rails on chair to RW.  Verbal cues for safe hand placement.   Ambulation/Gait Ambulation/Gait assistance: Min guard Ambulation Distance (Feet): 100 Feet Assistive device: Rolling walker (2 wheeled) Gait Pattern/deviations: Step-to pattern;Antalgic;Trunk flexed Gait velocity: decreased Gait velocity interpretation: Below normal speed for age/gender General Gait Details: Pt continuest to have a moderately antalgic gait pattern. Verbal cues to pt and educated family re: upright posture, leading into the walker with her right foot. Pt reported arm fatigue towards the end of gait.     Stairs Stairs: Yes Stairs assistance: Min assist Stair Management: One rail Left;Step to pattern;Sideways;Forwards (once with hand held assist, once with both hands on rail. ) Number of Stairs: 4 (2 x 2) General stair comments: Family present for stair education. Therapist assisting pt first time and family second time we practicied.  Verbal cues for correct LE sequencing.  Pt did better second time as she half turned towards the railing and put both hands on the railing to help lower herself up/down.  Per pt, this was how she was doing it before.          Balance Overall balance assessment: Needs assistance Sitting-balance support: Feet supported Sitting balance-Leahy Scale: Good     Standing balance support: Bilateral upper extremity supported;No upper extremity supported;Single extremity supported Standing balance-Leahy Scale: Fair                      Cognition Arousal/Alertness: Awake/alert Behavior During Therapy: WFL for tasks assessed/performed Overall Cognitive Status: Within Functional Limits for tasks assessed                      Exercises Total Joint Exercises Ankle Circles/Pumps: AROM;Both;10 reps;Supine Quad Sets: AROM;Both;10 reps;Supine Short Arc Quad: AAROM;Right;10 reps;Supine Heel Slides: AAROM;Right;10 reps;Supine Hip ABduction/ADduction: AAROM;Right;10 reps;Supine Straight Leg Raises: AAROM;Right;10 reps;Supine Goniometric ROM: 10-65 AAROM    General Comments General comments (skin integrity, edema, etc.): Exercise handout given and reviewed of below listed exercises, as well as fequency with pt and family with interpreter present (arr[facility] SW  dep[facility]        Pertinent Vitals/Pain Pain Assessment: 0-10 Pain Score: 5  Pain Location: right knee Pain Descriptors / Indicators: Aching;Burning Pain Intervention(s): Limited activity within patient's tolerance;Monitored during  session;Repositioned           PT Goals (current goals can now be found in the care plan section) Acute Rehab PT Goals Patient Stated Goal: go home Progress towards PT goals: Progressing toward goals    Frequency  7X/week    PT Plan Current plan remains appropriate       End of Session   Activity Tolerance: Patient limited by fatigue;Patient limited by pain Patient left: in chair;with call bell/phone within reach;with family/visitor present     Time: 8295-62131113-1153 PT Time Calculation (min) (ACUTE ONLY): 40 min  Charges:  $Gait Training: 8-22 mins $Therapeutic Exercise: 8-22 mins $Self Care/Home Management: 8-22                Katrinna Travieso B. Yoko Mcgahee, PT, DPT [phone]   04/04/2014, 12:38 PM

## 2014-04-04 NOTE — Discharge Instructions (Signed)

## 2014-04-04 NOTE — Progress Notes (Signed)
Mary [name] discharged home with home health per MD order. Discharge instructions reviewed and discussed with patient. All questions and concerns answered. Copy of instructions and scripts given to patient. IV removed.  Patient escorted to car by staff in a wheelchair. No distress noted upon discharge.   Mary [name], Mary [name] R 04/04/2014 12:23 PM

## 2014-04-04 NOTE — Progress Notes (Signed)
Occupational Therapy Treatment Patient Details Name: Oletha Blendlsa Quintero MRN: [id number] DOB: January 30, 1940 Today's Date: 04/04/2014    History of present illness 74 y.o. female [facility] 04/01/14 for elective R TKA.  Pt with significant PMHx of DM, CABG, back surgery (1992), L carpal tunnel release, and B eye surgery for glaucoma.    OT comments  Patient progressing nicely towards goals, change d/c recommendation to home with HHOT. Patient does have family that will be available for 24/7 supervision/assistance post acute d/c.    Follow Up Recommendations  Home health OT;Supervision/Assistance - 24 hour    Equipment Recommendations  3 in 1 bedside comode;Other (comment) (AE - reacher, sock aid, LH sponge, LH shoe horn)    Recommendations for Other Services  None at this time    Precautions / Restrictions Precautions Precautions: Knee Precaution Comments: reviewed no pillow under knee and positioning with foam or pillow under heel Restrictions Weight Bearing Restrictions: Yes RLE Weight Bearing: Weight bearing as tolerated       Mobility Bed Mobility General bed mobility comments: Patient seated in recliner upon OT entering room  Transfers  - Per PT note Overall transfer level: Needs assistance Equipment used: Rolling walker (2 wheeled) Transfers: Sit to/from Stand Sit to Stand: Min guard         General transfer comment: No transfer occured with OT, please see PT note for more information    Balance Overall balance assessment: Needs assistance Sitting-balance support: Feet supported Sitting balance-Leahy Scale: Good     Standing balance support: Bilateral upper extremity supported;No upper extremity supported;Single extremity supported Standing balance-Leahy Scale: Fair    ADL General ADL Comments: Interpreter and famiy present for education on use of AE for LB ADLs and safety with functional transfers using [facility] for toileting & showers. Therapist introduced,  educated, demonstrated, and had patient return demonstrate use of AE. Patient motivated to be as independent as possible. Family states they will be available for 24/7 supervision/assistance     Cognition   Behavior During Therapy: [facility] for tasks assessed/performed Overall Cognitive Status: Within Functional Limits for tasks assessed                Pertinent Vitals/ Pain       Pain Assessment: Faces Pain Score: 5  Faces Pain Scale: Hurts little more Pain Location: right knee Pain Descriptors / Indicators: Grimacing;Guarding Pain Intervention(s): Monitored during session         Frequency Min 2X/week     Progress Toward Goals  OT Goals(current goals can now be found in the care plan section)  Progress towards OT goals: Progressing toward goals  Acute Rehab OT Goals Patient Stated Goal: go home  Plan Discharge plan needs to be updated    Activity Tolerance Patient tolerated treatment well   Patient Left in chair;with call bell/phone within reach;with family/visitor present    Time: [id number] OT Time Calculation (min): 23 min  Charges: OT General Charges $OT Visit: 1 Procedure OT Treatments $Self Care/Home Management : 23-37 mins  Colbey Wirtanen , MS, OTR/L, CLT Pager: [phone]  04/04/2014, 1:01 PM

## 2014-04-05 DIAGNOSIS — M6281 Muscle weakness (generalized): Secondary | ICD-10-CM | POA: Diagnosis not present

## 2014-04-05 DIAGNOSIS — M150 Primary generalized (osteo)arthritis: Secondary | ICD-10-CM | POA: Diagnosis not present

## 2014-04-05 DIAGNOSIS — E119 Type 2 diabetes mellitus without complications: Secondary | ICD-10-CM | POA: Diagnosis not present

## 2014-04-05 DIAGNOSIS — I10 Essential (primary) hypertension: Secondary | ICD-10-CM | POA: Diagnosis not present

## 2014-04-05 DIAGNOSIS — R262 Difficulty in walking, not elsewhere classified: Secondary | ICD-10-CM | POA: Diagnosis not present

## 2014-04-06 ENCOUNTER — Encounter (HOSPITAL_COMMUNITY): Payer: Self-pay | Admitting: *Deleted

## 2014-04-06 ENCOUNTER — Emergency Department (HOSPITAL_COMMUNITY): Payer: Medicare Other

## 2014-04-06 ENCOUNTER — Other Ambulatory Visit (HOSPITAL_COMMUNITY): Payer: Self-pay

## 2014-04-06 ENCOUNTER — Inpatient Hospital Stay (HOSPITAL_COMMUNITY)
Admission: EM | Admit: 2014-04-06 | Discharge: 2014-04-08 | DRG: 193 | Disposition: A | Discharge status: Home / Self Care | Payer: Medicare Other | Attending: Internal Medicine | Admitting: Internal Medicine

## 2014-04-06 DIAGNOSIS — E871 Hypo-osmolality and hyponatremia: Secondary | ICD-10-CM | POA: Diagnosis not present

## 2014-04-06 DIAGNOSIS — Z96651 Presence of right artificial knee joint: Secondary | ICD-10-CM | POA: Diagnosis present

## 2014-04-06 DIAGNOSIS — E119 Type 2 diabetes mellitus without complications: Secondary | ICD-10-CM | POA: Diagnosis present

## 2014-04-06 DIAGNOSIS — Z66 Do not resuscitate: Secondary | ICD-10-CM | POA: Diagnosis present

## 2014-04-06 DIAGNOSIS — D62 Acute posthemorrhagic anemia: Secondary | ICD-10-CM

## 2014-04-06 DIAGNOSIS — J189 Pneumonia, unspecified organism: Secondary | ICD-10-CM

## 2014-04-06 DIAGNOSIS — M25561 Pain in right knee: Secondary | ICD-10-CM | POA: Diagnosis not present

## 2014-04-06 DIAGNOSIS — Y95 Nosocomial condition: Secondary | ICD-10-CM | POA: Diagnosis present

## 2014-04-06 DIAGNOSIS — R509 Fever, unspecified: Secondary | ICD-10-CM | POA: Diagnosis not present

## 2014-04-06 DIAGNOSIS — E088 Diabetes mellitus due to underlying condition with unspecified complications: Secondary | ICD-10-CM

## 2014-04-06 DIAGNOSIS — E785 Hyperlipidemia, unspecified: Secondary | ICD-10-CM | POA: Diagnosis present

## 2014-04-06 DIAGNOSIS — L0390 Cellulitis, unspecified: Secondary | ICD-10-CM

## 2014-04-06 DIAGNOSIS — J9691 Respiratory failure, unspecified with hypoxia: Secondary | ICD-10-CM | POA: Diagnosis present

## 2014-04-06 DIAGNOSIS — R0902 Hypoxemia: Secondary | ICD-10-CM | POA: Diagnosis not present

## 2014-04-06 DIAGNOSIS — I2510 Atherosclerotic heart disease of native coronary artery without angina pectoris: Secondary | ICD-10-CM | POA: Diagnosis present

## 2014-04-06 DIAGNOSIS — M1990 Unspecified osteoarthritis, unspecified site: Secondary | ICD-10-CM | POA: Diagnosis present

## 2014-04-06 DIAGNOSIS — Z794 Long term (current) use of insulin: Secondary | ICD-10-CM

## 2014-04-06 DIAGNOSIS — Z87891 Personal history of nicotine dependence: Secondary | ICD-10-CM | POA: Diagnosis not present

## 2014-04-06 DIAGNOSIS — I10 Essential (primary) hypertension: Secondary | ICD-10-CM | POA: Diagnosis present

## 2014-04-06 DIAGNOSIS — Z951 Presence of aortocoronary bypass graft: Secondary | ICD-10-CM

## 2014-04-06 DIAGNOSIS — R0602 Shortness of breath: Secondary | ICD-10-CM | POA: Diagnosis not present

## 2014-04-06 HISTORY — DX: Hypoxemia: R09.02

## 2014-04-06 HISTORY — DX: Hypo-osmolality and hyponatremia: E87.1

## 2014-04-06 HISTORY — DX: Diabetes mellitus due to underlying condition with unspecified complications: E08.8

## 2014-04-06 HISTORY — DX: Cellulitis, unspecified: L03.90

## 2014-04-06 HISTORY — DX: Fever, unspecified: R50.9

## 2014-04-06 LAB — GLUCOSE, CAPILLARY
GLUCOSE-CAPILLARY: 161 mg/dL — AB (ref 70–99)
GLUCOSE-CAPILLARY: 108 mg/dL — AB (ref 70–99)
Glucose-Capillary: 212 mg/dL — ABNORMAL HIGH (ref 70–99)

## 2014-04-06 LAB — COMPREHENSIVE METABOLIC PANEL
ALBUMIN: 3 g/dL — AB (ref 3.5–5.2)
ALT: 24 U/L (ref 0–35)
ANION GAP: 12 (ref 5–15)
AST: 26 U/L (ref 0–37)
Alkaline Phosphatase: 53 U/L (ref 39–117)
BUN: 19 mg/dL (ref 6–23)
CALCIUM: 8.6 mg/dL (ref 8.4–10.5)
CO2: 27 mmol/L (ref 19–32)
Chloride: 92 mmol/L — ABNORMAL LOW (ref 96–112)
Creatinine, Ser: 0.79 mg/dL (ref 0.50–1.10)
GFR calc Af Amer: >90 mL/min (ref >90)
GFR calc non Af Amer: 81 mL/min — ABNORMAL LOW (ref >90)
Glucose, Bld: 198 mg/dL — ABNORMAL HIGH (ref 70–99)
Potassium: 4 mmol/L (ref 3.5–5.1)
Sodium: 131 mmol/L — ABNORMAL LOW (ref 135–145)
Total Bilirubin: 0.9 mg/dL (ref 0.3–1.2)
Total Protein: 6.3 g/dL (ref 6.0–8.3)

## 2014-04-06 LAB — URINALYSIS, ROUTINE W REFLEX MICROSCOPIC
BILIRUBIN URINE: NEGATIVE
Glucose, UA: NEGATIVE mg/dL
Hgb urine dipstick: NEGATIVE
Ketones, ur: NEGATIVE mg/dL
Leukocytes, UA: NEGATIVE
Nitrite: NEGATIVE
PROTEIN: NEGATIVE mg/dL
SPECIFIC GRAVITY, URINE: 1.008 (ref 1.005–1.030)
Urobilinogen, UA: 1 mg/dL (ref 0.0–1.0)
pH: 7.5 (ref 5.0–8.0)

## 2014-04-06 LAB — CBC WITH DIFFERENTIAL/PLATELET
Basophils Absolute: 0 10*3/uL (ref 0.0–0.1)
Basophils Relative: 0 % (ref 0–1)
Eosinophils Absolute: 0.1 10*3/uL (ref 0.0–0.7)
Eosinophils Relative: 1 % (ref 0–5)
HCT: 30.3 % — ABNORMAL LOW (ref 36.0–46.0)
Hemoglobin: 10 g/dL — ABNORMAL LOW (ref 12.0–15.0)
Lymphocytes Relative: 11 % — ABNORMAL LOW (ref 12–46)
Lymphs Abs: 0.9 10*3/uL (ref 0.7–4.0)
MCH: 28.5 pg (ref 26.0–34.0)
MCHC: 33 g/dL (ref 30.0–36.0)
MCV: 86.3 fL (ref 78.0–100.0)
MONO ABS: 0.7 10*3/uL (ref 0.1–1.0)
Monocytes Relative: 9 % (ref 3–12)
Neutro Abs: 6.3 10*3/uL (ref 1.7–7.7)
Neutrophils Relative %: 79 % — ABNORMAL HIGH (ref 43–77)
PLATELETS: 258 10*3/uL (ref 150–400)
RBC: 3.51 MIL/uL — ABNORMAL LOW (ref 3.87–5.11)
RDW: 12.5 % (ref 11.5–15.5)
WBC: 8 10*3/uL (ref 4.0–10.5)

## 2014-04-06 LAB — INFLUENZA PANEL BY PCR (TYPE A & B)
H1N1FLUPCR: NOT DETECTED
Influenza A By PCR: NEGATIVE
Influenza B By PCR: NEGATIVE

## 2014-04-06 LAB — STREP PNEUMONIAE URINARY ANTIGEN: STREP PNEUMO URINARY ANTIGEN: NEGATIVE

## 2014-04-06 LAB — MRSA PCR SCREENING: MRSA BY PCR: NEGATIVE

## 2014-04-06 LAB — I-STAT CG4 LACTIC ACID, ED: Lactic Acid, Venous: 1.22 mmol/L (ref 0.5–2.0)

## 2014-04-06 MED ORDER — CLOPIDOGREL BISULFATE 75 MG PO TABS
75.0000 mg | ORAL_TABLET | Freq: Every day | ORAL | Status: DC
  Administered 2014-04-06 – 2014-04-08 (×3): 75 mg via ORAL
  Filled 2014-04-06 (×3): qty 1

## 2014-04-06 MED ORDER — ACETAMINOPHEN 325 MG PO TABS: 650.0000 mg | ORAL_TABLET | Freq: Four times a day (QID) | ORAL | Status: DC | PRN

## 2014-04-06 MED ORDER — SODIUM CHLORIDE 0.9 % IV SOLN
INTRAVENOUS | Status: AC
  Administered 2014-04-06: 12:00:00 via INTRAVENOUS

## 2014-04-06 MED ORDER — ACETAMINOPHEN 650 MG RE SUPP: 650.0000 mg | Freq: Four times a day (QID) | RECTAL | Status: DC | PRN

## 2014-04-06 MED ORDER — LEVOFLOXACIN IN D5W 500 MG/100ML IV SOLN
500.0000 mg | INTRAVENOUS | Status: DC
  Administered 2014-04-06: 500 mg via INTRAVENOUS
  Filled 2014-04-06 (×2): qty 100

## 2014-04-06 MED ORDER — ASPIRIN EC 325 MG PO TBEC
325.0000 mg | DELAYED_RELEASE_TABLET | Freq: Two times a day (BID) | ORAL | Status: DC
  Administered 2014-04-06 – 2014-04-08 (×5): 325 mg via ORAL
  Filled 2014-04-06 (×7): qty 1

## 2014-04-06 MED ORDER — ENOXAPARIN SODIUM 40 MG/0.4ML ~~LOC~~ SOLN
40.0000 mg | SUBCUTANEOUS | Status: DC
  Administered 2014-04-06 – 2014-04-08 (×3): 40 mg via SUBCUTANEOUS
  Filled 2014-04-06 (×3): qty 0.4

## 2014-04-06 MED ORDER — AMLODIPINE BESYLATE 10 MG PO TABS
10.0000 mg | ORAL_TABLET | Freq: Every day | ORAL | Status: DC
  Administered 2014-04-06 – 2014-04-08 (×3): 10 mg via ORAL
  Filled 2014-04-06 (×3): qty 1

## 2014-04-06 MED ORDER — SENNOSIDES-DOCUSATE SODIUM 8.6-50 MG PO TABS: 1.0000 | ORAL_TABLET | Freq: Every evening | ORAL | Status: DC | PRN

## 2014-04-06 MED ORDER — INSULIN ASPART 100 UNIT/ML ~~LOC~~ SOLN: 0.0000 [IU] | Freq: Every day | SUBCUTANEOUS | Status: DC

## 2014-04-06 MED ORDER — BENAZEPRIL HCL 20 MG PO TABS
20.0000 mg | ORAL_TABLET | Freq: Every day | ORAL | Status: DC
  Administered 2014-04-06 – 2014-04-08 (×3): 20 mg via ORAL
  Filled 2014-04-06 (×3): qty 1

## 2014-04-06 MED ORDER — INSULIN ASPART 100 UNIT/ML ~~LOC~~ SOLN
3.0000 [IU] | Freq: Three times a day (TID) | SUBCUTANEOUS | Status: DC
  Administered 2014-04-06 – 2014-04-08 (×6): 3 [IU] via SUBCUTANEOUS

## 2014-04-06 MED ORDER — MORPHINE SULFATE 2 MG/ML IJ SOLN
2.0000 mg | INTRAMUSCULAR | Status: DC | PRN
  Administered 2014-04-06: 2 mg via INTRAVENOUS
  Filled 2014-04-06: qty 1

## 2014-04-06 MED ORDER — SODIUM CHLORIDE 0.9 % IJ SOLN
3.0000 mL | Freq: Two times a day (BID) | INTRAMUSCULAR | Status: DC
  Administered 2014-04-06 – 2014-04-07 (×3): 3 mL via INTRAVENOUS

## 2014-04-06 MED ORDER — ONDANSETRON HCL 4 MG/2ML IJ SOLN: 4.0000 mg | Freq: Four times a day (QID) | INTRAMUSCULAR | Status: DC | PRN

## 2014-04-06 MED ORDER — DOCUSATE SODIUM 100 MG PO CAPS
100.0000 mg | ORAL_CAPSULE | Freq: Two times a day (BID) | ORAL | Status: DC
  Administered 2014-04-06 – 2014-04-08 (×5): 100 mg via ORAL
  Filled 2014-04-06 (×7): qty 1

## 2014-04-06 MED ORDER — INSULIN ASPART 100 UNIT/ML ~~LOC~~ SOLN
0.0000 [IU] | Freq: Three times a day (TID) | SUBCUTANEOUS | Status: DC
  Administered 2014-04-06: 3 [IU] via SUBCUTANEOUS
  Administered 2014-04-06: 5 [IU] via SUBCUTANEOUS
  Administered 2014-04-07: 3 [IU] via SUBCUTANEOUS
  Administered 2014-04-07: 5 [IU] via SUBCUTANEOUS
  Administered 2014-04-08: 2 [IU] via SUBCUTANEOUS

## 2014-04-06 MED ORDER — INSULIN GLARGINE 100 UNIT/ML ~~LOC~~ SOLN
5.0000 [IU] | Freq: Every day | SUBCUTANEOUS | Status: DC
  Administered 2014-04-06 – 2014-04-08 (×3): 5 [IU] via SUBCUTANEOUS
  Filled 2014-04-06 (×3): qty 0.05

## 2014-04-06 MED ORDER — VANCOMYCIN HCL IN DEXTROSE 1-5 GM/200ML-% IV SOLN
1000.0000 mg | Freq: Once | INTRAVENOUS | Status: AC
  Administered 2014-04-06: 1000 mg via INTRAVENOUS
  Filled 2014-04-06: qty 200

## 2014-04-06 MED ORDER — SODIUM CHLORIDE 0.9 % IV SOLN
INTRAVENOUS | Status: AC
  Administered 2014-04-06: 08:00:00 via INTRAVENOUS

## 2014-04-06 MED ORDER — AMLODIPINE BESY-BENAZEPRIL HCL 10-20 MG PO CAPS: 1.0000 | ORAL_CAPSULE | Freq: Every day | ORAL | Status: DC

## 2014-04-06 MED ORDER — CEFAZOLIN SODIUM-DEXTROSE 2-3 GM-% IV SOLR
2.0000 g | Freq: Once | INTRAVENOUS | Status: AC
  Administered 2014-04-06: 2 g via INTRAVENOUS
  Filled 2014-04-06: qty 50

## 2014-04-06 MED ORDER — HYDROCODONE-ACETAMINOPHEN 5-325 MG PO TABS
1.0000 | ORAL_TABLET | ORAL | Status: DC | PRN
  Administered 2014-04-07: 1 via ORAL
  Administered 2014-04-08: 2 via ORAL
  Filled 2014-04-06: qty 2
  Filled 2014-04-06: qty 1

## 2014-04-06 MED ORDER — ALUM & MAG HYDROXIDE-SIMETH 200-200-20 MG/5ML PO SUSP: 30.0000 mL | Freq: Four times a day (QID) | ORAL | Status: DC | PRN

## 2014-04-06 MED ORDER — ONDANSETRON HCL 4 MG PO TABS: 4.0000 mg | ORAL_TABLET | Freq: Four times a day (QID) | ORAL | Status: DC | PRN

## 2014-04-06 NOTE — Progress Notes (Signed)
NURSING PROGRESS NOTE  Mary Blendlsa Mackowiak [id number] Admission Data: 04/06/2014 [id number] Attending Provider: Elease EtienneAnand D Hongalgi, MD BJY:NWGNFA,[id number]:HODGES,FRANCISCO, MD Code Status: DNRhju  Allergies:  Review of patient's allergies indicates no known allergies. Past Medical History:   has a past medical history of Hyperlipemia; Diabetes mellitus without complication; Arthritis; and H/O transfusion. Past Surgical History:   has past surgical history that includes Coronary artery bypass graft (11/10/1994); Back surgery (1992); Tubal ligation; Carpal tunnel release (Left); Cardiac catheterization; Colonoscopy; Eye surgery (Bilateral); and Total knee arthroplasty (Right, 04/01/2014). Social History:   reports that she quit smoking about 29 years ago. She does not have any smokeless tobacco history on file. She reports that she does not drink alcohol or use illicit drugs.  Mary [name] is a 74 y.o. female patient admitted from ED:   Last Documented Vital Signs: Blood pressure 139/64, pulse 89, temperature 99.3 F (37.4 C), temperature source Oral, resp. rate 18, height 5\' 6"  (1.676 m), weight 78.9 kg (173 lb 15.1 oz), SpO2 97 %.  Cardiac Monitoring: Box # 6 in place. Cardiac monitor yields:normal sinus rhythm.  IV Fluids:  IV in place, occlusive dsg intact without redness, IV cath wrist right, condition patent and no redness normal saline.   Skin: R Knee Replacement Incisional Site from 04/01/14 MD aware.  Patient/Family orientated to room. Information packet given to patient/family. Admission inpatient armband information verified with patient/family to include name and date of birth and placed on patient arm. Side rails up x 2, fall assessment and education completed with patient/family. Patient/family able to verbalize understanding of risk associated with falls and verbalized understanding to call for assistance before getting out of bed. Call light within reach. Patient/family able to voice and demonstrate  understanding of unit orientation instructions.    Will continue to evaluate and treat per MD orders.   Leane PlattSpencer Donya Tomaro RN, BS, BSN

## 2014-04-06 NOTE — ED Notes (Addendum)
Admitting team at bedside.

## 2014-04-06 NOTE — Progress Notes (Signed)
VASCULAR LAB PRELIMINARY  PRELIMINARY  PRELIMINARY  PRELIMINARY  Right lower extremity venous Dopplers completed.    Preliminary report:  There is no DVT or SVT noted in the right lower extremity.   Genelda Roark, RVT 04/06/2014, 4:46 PM

## 2014-04-06 NOTE — ED Notes (Signed)
Pharmacy at bedside

## 2014-04-06 NOTE — H&P (Signed)
[facility] History and Physical                                                                                    Mary [name], is a 74 y.o. female  MRN: [id number]   DOB - [birth year]  Admit Date - 04/06/2014  Outpatient Primary MD for the patient is HODGES,FRANCISCO, MD  With History of -  Past Medical History  Diagnosis Date  . Hyperlipemia   . Diabetes mellitus without complication     DM II  . Arthritis   . H/O transfusion       Past Surgical History  Procedure Laterality Date  . Coronary artery bypass graft  11/10/1994    x 3  . Back surgery  1992  . Tubal ligation    . Carpal tunnel release Left   . Cardiac catheterization    . Colonoscopy    . Eye surgery Bilateral     Glaucoma  . Total knee arthroplasty Right 04/01/2014    Procedure: RIGHT TOTAL KNEE ARTHROPLASTY;  Surgeon: Gean BirchwoodFrank Rowan, MD;  Location: [facility] OR;  Service: Orthopedics;  Laterality: Right;    in for   Chief Complaint  Patient presents with  . Post-op Problem  . Fever     HPI  Mary [name]  is a 74 y.o. female, with a pmh of CABG at 6852, DM, arthritis who had a right total knee replacement on 3/21.  History is given by her son as the patient does not speak english.  He reports that her oxygen sats were not quite normal when she was discharged on 3/24.  The patient was seen by home health RN on Thursday who again noted that her oxygen sats were a little low.  On Friday a different home health RN noted the oxygen sats being low and told the patient and family that if she developed a fever of 101 to seek medical attention.  Overnight last night the son heard his mother and asked his wife to go tend to her.  The wife checked her temperature and found it to be 101, so she was brought to the ER.  Otherwise the patient has had a slight cough with minimal sputum production, no HA, no vomiting, changes in bowel habits, dysuria, chest pain or difficulty breathing.  The son mentions that the patient's grandson  who recently had strep throat has been sleeping with her since discharge.  In the ER the patient has a low grade fever, 100.4, oxygen sats of 89% on room air.  Cxr shows atelectasis vs plugging vs pna.  Will admit for fever with mild hypoxia to closely monitor.    Review of Systems   In addition to the HPI above,  No Headache, No changes with Vision or hearing, No problems swallowing food or Liquids, No Chest pain No Abdominal pain, No Nausea or Vomiting, Bowel movements are regular, No Blood in stool or Urine, No dysuria, No new skin rashes or bruises, No new joints pains-aches,  No new weakness, tingling, numbness in any extremity, No recent weight gain or loss, A full 10 point Review of Systems was done, except as stated above, all  other Review of Systems were negative.  Social History History  Substance Use Topics  . Smoking status: Never Smoker   . Smokeless tobacco: Not on file  . Alcohol Use: No    Family History Per son, multiple family members with heart disease and DM.  Prior to Admission medications   Medication Sig Start Date End Date Taking? Authorizing Provider  amLODipine-benazepril (LOTREL) 10-20 MG per capsule Take 1 capsule by mouth daily.   Yes Historical Provider, MD  clopidogrel (PLAVIX) 75 MG tablet Take 75 mg by mouth daily.   Yes Historical Provider, MD  fenofibrate (TRICOR) 145 MG tablet Take 145 mg by mouth daily.   Yes Historical Provider, MD  oxyCODONE-acetaminophen (ROXICET) 5-325 MG per tablet Take 1 tablet by mouth every 4 (four) hours as needed. 04/01/14  Yes Allena Katz, PA-C  SitaGLIPtin-MetFORMIN HCl 50-1000 MG TB24 Take 1 tablet by mouth daily.   Yes Historical Provider, MD  aspirin EC 325 MG tablet Take 1 tablet (325 mg total) by mouth 2 (two) times daily. Patient not taking: Reported on 04/06/2014 04/01/14   Allena Katz, PA-C    No Known Allergies  Physical Exam  Vitals  Blood pressure 132/69, pulse 94, temperature 100.4 F (38  C), temperature source Oral, resp. rate 20, height  (1.676 m), weight 74.844 kg (165 lb), SpO2 97 %.   General:  [name], pleasant 74 yo female lying in bed in NAD, appears younger than stated age.  Son at bedside.  Psych:  Normal affect and insight, Not Suicidal or Homicidal, Awake Alert, Oriented X 3.  Neuro:   No F.N deficits, ALL C.Nerves Intact, Strength 5/5 all 4 extremities, Sensation intact all 4 extremities.  ENT:  Ears and Eyes appear Normal, Conjunctivae clear, PER. Moist oral mucosa without erythema or exudates.  Neck:  Supple, No lymphadenopathy appreciated  Respiratory:  Symmetrical chest wall movement, Good air movement bilaterally, CTAB.  Cardiac:  RRR, No Murmurs, no LE edema noted, no JVD.    Abdomen:  Positive bowel sounds, Soft, mild tenderness in LLQ, Non distended,  No masses appreciated  Skin:  Right lower extremity - post op knee.  Red and swollen as would be expected.  Additional swelling/redness of the distal tibial area.  Extremities:  Able to move all 4. 5/5 strength in each,  no effusions.  Data Review  CBC  Recent Labs Lab 04/02/14 0520 04/03/14 0725 04/04/14 0612 04/06/14 0340  WBC 13.4* 13.0* 14.8* 8.0  HGB 12.3 11.6* 11.1* 10.0*  HCT 37.4 34.6* 34.2* 30.3*  PLT 269 213 211 258  MCV 87.8 87.6 88.6 86.3  MCH 28.9 29.4 28.8 28.5  MCHC 32.9 33.5 32.5 33.0  RDW 12.4 12.5 12.5 12.5  LYMPHSABS  --   --   --  0.9  MONOABS  --   --   --  0.7  EOSABS  --   --   --  0.1  BASOSABS  --   --   --  0.0    Chemistries   Recent Labs Lab 04/06/14 0340  NA 131*  K 4.0  CL 92*  CO2 27  GLUCOSE 198*  BUN 19  CREATININE 0.79  CALCIUM 8.6  AST 26  ALT 24  ALKPHOS 53  BILITOT 0.9    Urinalysis    Component Value Date/Time   COLORURINE YELLOW 04/06/2014 0641   APPEARANCEUR CLEAR 04/06/2014 0641   LABSPEC 1.008 04/06/2014 0641   PHURINE 7.5 04/06/2014 1610  GLUCOSEU NEGATIVE 04/06/2014 0641   HGBUR NEGATIVE 04/06/2014 0641    BILIRUBINUR NEGATIVE 04/06/2014 0641   KETONESUR NEGATIVE 04/06/2014 0641   PROTEINUR NEGATIVE 04/06/2014 0641   UROBILINOGEN 1.0 04/06/2014 0641   NITRITE NEGATIVE 04/06/2014 0641   LEUKOCYTESUR NEGATIVE 04/06/2014 0641    Imaging results:   Dg Chest 2 View  03/21/2014   CLINICAL DATA:  Preoperative evaluation  EXAM: CHEST  2 VIEW  COMPARISON:  None.  FINDINGS: Cardiac shadow is mildly enlarged. Postsurgical changes are seen. Coronary stent was noted. The lungs are clear bilaterally. No acute bony abnormality is seen.  IMPRESSION: No acute abnormality noted.   Electronically Signed   By: Alcide Clever M.D.   On: 03/21/2014 14:49   Dg Chest Port 1 View  04/06/2014   CLINICAL DATA:  Fever. Post right knee arthroplasty 5 days prior 04/01/2014.  EXAM: PORTABLE CHEST - 1 VIEW  COMPARISON:  Chest radiograph 03/21/2014  FINDINGS: There is new elevation of right hemidiaphragm with opacity at the right lung base. The left lung is clear. Patient is post median sternotomy. Heart remains at the upper limits of normal in size. Pulmonary vasculature is normal. No pneumothorax.  IMPRESSION: Volume loss in the right hemithorax with elevation of right hemidiaphragm in opacity at the right lung base. This may reflect atelectasis related to mucous plugging versus pneumonia.   Electronically Signed   By: Rubye Oaks M.D.   On: 04/06/2014 06:03    My personal review of EKG: sinus tach, occasional PVCs.  QTC not prolonged.   Assessment & Plan  Principal Problem:   HCAP (healthcare-associated pneumonia) Active Problems:   Hypoxia   Cellulitis   Diabetes   S/P CABG x 3  Very pleasant spanish speaking female with history of well controlled DM, 3V CABG at age 52, and arthritis presented to the ED with hypoxia, fever, and increased pain and swelling in her right lower extremity 5 day s/p right knee replacement.  Fever Patient developed fever last night of 101.  Home health RN instructed patient to seek  medical attention if she developed a fever. Patient appears non toxic.  She is not complaining of cough/HA.  She has minimal clear sputum. Had recent admission & exposure to grandson with strep. Admit and monitor closely.  Will reconsider antibiotics if she spikes a fever once again.  Hypoxia. Oxygen sat in the 80s on room air in the ER. Atlectasis vs  Mucous plugging vs HCAP seen on CXR. HIV, Legionella, Strep checked. Blood cultures ordered by EDP.  Check Flu PCR.  Droplet precautions. But given recent surgery and swollen right leg will check RLE duplex stat. Oxygen PRN.  Chest Physiotherapy from RT.  Please mobilize.  Recent right knee replacement  Surgical site does not appear acutely infected.   Management per Orthopedic Surgery.  Consulted by EDP.  Redness and swelling of distal RLE Red swollen warm ankle. Maybe normal post op. Check lower ext. Duplex.  DM Normally well controlled (cbgs in 120s) now uncontrolled (Cbgs in 200s) Hold oral medications. Start on Lantus / novolog.  Please titrate as needed.  Hyponatremia Possibly multifactorial.  Chloride is low as well so this may be dehydration. Worsened by elevated cbgs Will give gentle IVF and monitor.  Sodium was 140 on 03/22/14.  CAD with history of 3V CABG at age 49 Continue aspirin and plavix.  EKG with PVC Recheck EKG on 3/27.   DVT Prophylaxis: lovenox  AM Labs Ordered, also please review Full Orders  Family Communication:   Son at bedside  Code Status:  DNR  Condition:  Guarded.  Time spent in minutes : 60   Algis Downs,  PA-C on 04/06/2014 at 8:00 AM  Between 7am to 7pm - Pager - [phone]  After 7pm go to [email or web address] - password [email or web address]  And look for the night coverage person covering me after hours  [facility]

## 2014-04-06 NOTE — ED Provider Notes (Signed)
CSN: [id number]     Arrival date & time 04/06/14  0329 History  This chart was scribed for Loren Racer, MD by Tanda Rockers, ED Scribe. This patient was seen in room D35C/D35C and the patient's care was started at 3:42 AM.    Chief Complaint  Patient presents with  . Post-op Problem  . Fever   The history is provided by the patient and the spouse. No language interpreter was used.     HPI Comments: Mary [name] is a 74 y.o. female who presents to the Emergency Department complaining of fever s/p right total knee arthroplasty that was done Monday, 3/21 by Dr. Turner Daniels. Spouse mentions that pt also had chills. She denies nausea, vomiting, or any other symptoms. Spouse states that pt was told to come to the ED if she developed a fever. He reports that pt was recovering well from her surgery besides for the fever. Complains of persistent pain to the right knee   Past Medical History  Diagnosis Date  . Hyperlipemia   . Diabetes mellitus without complication     DM II  . Arthritis   . H/O transfusion    Past Surgical History  Procedure Laterality Date  . Coronary artery bypass graft  11/10/1994    x 3  . Back surgery  1992  . Tubal ligation    . Carpal tunnel release Left   . Cardiac catheterization    . Colonoscopy    . Eye surgery Bilateral     Glaucoma  . Total knee arthroplasty Right 04/01/2014    Procedure: RIGHT TOTAL KNEE ARTHROPLASTY;  Surgeon: Gean Birchwood, MD;  Location: [facility] OR;  Service: Orthopedics;  Laterality: Right;   History reviewed. No pertinent family history. History  Substance Use Topics  . Smoking status: Former Smoker -- 0.50 packs/day for 5 years    Quit date: 04/05/1985  . Smokeless tobacco: Not on file  . Alcohol Use: No   OB History    No data available     Review of Systems  Constitutional: Positive for fever and chills.  Respiratory: Positive for cough. Negative for shortness of breath.   Cardiovascular: Negative for chest pain.   Gastrointestinal: Negative for nausea, vomiting, abdominal pain, diarrhea and constipation.  Genitourinary: Negative for dysuria and flank pain.  Musculoskeletal: Positive for joint swelling and arthralgias.  Neurological: Negative for dizziness, weakness and numbness.  All other systems reviewed and are negative.     Allergies  Review of patient's allergies indicates no known allergies.  Home Medications   Prior to Admission medications   Medication Sig Start Date End Date Taking? Authorizing Provider  amLODipine-benazepril (LOTREL) 10-20 MG per capsule Take 1 capsule by mouth daily.   Yes Historical Provider, MD  clopidogrel (PLAVIX) 75 MG tablet Take 75 mg by mouth daily.   Yes Historical Provider, MD  oxyCODONE-acetaminophen (ROXICET) 5-325 MG per tablet Take 1 tablet by mouth every 4 (four) hours as needed. 04/01/14  Yes Allena Katz, PA-C  SitaGLIPtin-MetFORMIN HCl 50-1000 MG TB24 Take 1 tablet by mouth daily.   Yes Historical Provider, MD  aspirin EC 325 MG tablet Take 1 tablet (325 mg total) by mouth 2 (two) times daily. Patient not taking: Reported on 04/06/2014 04/01/14   Allena Katz, PA-C  fenofibrate (TRICOR) 145 MG tablet Take 145 mg by mouth daily.    Historical Provider, MD   Triage Vitals: BP 129/66 mmHg  Pulse 98  Temp(Src) 100.4 F (38 C) (Oral)  Resp  23  Ht 5\' 6"  (1.676 m)  Wt 165 lb (74.844 kg)  BMI 26.64 kg/m2  SpO2 92%   Physical Exam  Constitutional: She is oriented to person, place, and time. She appears well-developed and well-nourished. No distress.  HENT:  Head: Normocephalic and atraumatic.  Mouth/Throat: Oropharynx is clear and moist.  Eyes: EOM are normal. Pupils are equal, round, and reactive to light.  Neck: Normal range of motion. Neck supple.  Cardiovascular: Normal rate and regular rhythm.   Pulmonary/Chest: Effort normal and breath sounds normal. No respiratory distress. She has no wheezes. She has no rales. She exhibits no  tenderness.  Decreased breath sounds right base  Abdominal: Soft. Bowel sounds are normal. She exhibits no distension and no mass. There is no tenderness. There is no rebound and no guarding.  Musculoskeletal: Normal range of motion. She exhibits no edema or tenderness.  Decreased range of motion to the right knee due to pain. There is moderate effusion to the right knee. Erythema along the surgical incision with warmth. Distal pulses intact.  Neurological: She is alert and oriented to person, place, and time.  Skin: Skin is warm and dry. No rash noted. No erythema.  Psychiatric: She has a normal mood and affect. Her behavior is normal.  Nursing note and vitals reviewed.   ED Course  Procedures (including critical care time)  DIAGNOSTIC STUDIES: Oxygen Saturation is 92% on RA, normal by my interpretation.    COORDINATION OF CARE: 3:51 AM-Discussed treatment plan which includes CBC, CMP, Lactic acid with pt at bedside and pt agreed to plan.   Labs Review Labs Reviewed  CBC WITH DIFFERENTIAL/PLATELET - Abnormal; Notable for the following:    RBC 3.51 (*)    Hemoglobin 10.0 (*)    HCT 30.3 (*)    Neutrophils Relative % 79 (*)    Lymphocytes Relative 11 (*)    All other components within normal limits  COMPREHENSIVE METABOLIC PANEL - Abnormal; Notable for the following:    Sodium 131 (*)    Chloride 92 (*)    Glucose, Bld 198 (*)    Albumin 3.0 (*)    GFR calc non Af Amer 81 (*)    All other components within normal limits  GLUCOSE, CAPILLARY - Abnormal; Notable for the following:    Glucose-Capillary 212 (*)    All other components within normal limits  GLUCOSE, CAPILLARY - Abnormal; Notable for the following:    Glucose-Capillary 161 (*)    All other components within normal limits  GLUCOSE, CAPILLARY - Abnormal; Notable for the following:    Glucose-Capillary 108 (*)    All other components within normal limits  MRSA PCR SCREENING  CULTURE, BLOOD (ROUTINE X 2)  CULTURE,  BLOOD (ROUTINE X 2)  URINALYSIS, ROUTINE W REFLEX MICROSCOPIC  STREP PNEUMONIAE URINARY ANTIGEN  INFLUENZA PANEL BY PCR (TYPE A & B, H1N1)  HIV ANTIBODY (ROUTINE TESTING)  LEGIONELLA ANTIGEN, URINE  BASIC METABOLIC PANEL  CBC  I-STAT CG4 LACTIC ACID, ED    Imaging Review Dg Chest Port 1 View  04/06/2014   CLINICAL DATA:  Fever. Post right knee arthroplasty 5 days prior 04/01/2014.  EXAM: PORTABLE CHEST - 1 VIEW  COMPARISON:  Chest radiograph 03/21/2014  FINDINGS: There is new elevation of right hemidiaphragm with opacity at the right lung base. The left lung is clear. Patient is post median sternotomy. Heart remains at the upper limits of normal in size. Pulmonary vasculature is normal. No pneumothorax.  IMPRESSION: Volume loss  in the right hemithorax with elevation of right hemidiaphragm in opacity at the right lung base. This may reflect atelectasis related to mucous plugging versus pneumonia.   Electronically Signed   By: Rubye Oaks M.D.   On: 04/06/2014 06:03     EKG Interpretation None      MDM   Final diagnoses:  Fever  HCAP (healthcare-associated pneumonia)   Discussed with Dr. Jerl Santos. Recommends Ancef 2 g in the emergency department and starting the patient on Keflex to follow-up with Dr. Turner Daniels on Monday.  Patient with right basilar infiltrate. She's having hypoxia into the 80s in the emergency department. Started on supplemental oxygen. Will cover for hospital-acquired pneumonia. Likely need admission  Discussed with Dr Clyde Lundborg. Will admit to tele bed. Also spoke with Dr Yisroel Ramming and requested in-hopsital consult regarding possible post-op infection.   Loren Racer, MD 04/07/14 [phone]

## 2014-04-06 NOTE — ED Notes (Signed)
Pt arrives from home via [facility]. Pt had right knee surgery and was released on Monday. Pt c/o of fever of 101.

## 2014-04-06 NOTE — Progress Notes (Signed)
Called and received Report from ED RN Lockie ParesJaclyn.

## 2014-04-06 NOTE — Consult Note (Signed)
Marcene Corning, MD           Elodia Florence, PA-C  [facility]   [address], [place], [place]  [address]   ORTHOPAEDIC CONSULTATION  Mary [name]            MRN:  [id number] DOB/SEX:  29-Mar-1940/female    REQUESTING PHYSICIAN:    CHIEF COMPLAINT:  Painful right knee 5 days s/p TKR  HISTORY: Mary [name] a 74 y.o. female with a fever of unknown origin one time of 101 degrees. She denies any significant knee pain.  She was told in the ED that she may have some pneumonia.  Medicine was going to admit her for this and we were consult at to make sure that her right knee was not infected that she had replaced 5 days ago.  She denies any calf pain.  She denies any significant swelling.  Her husband states she was on a CPM machine set at 0-60 last night without any significant discomfort.she denies any nausea, vomiting, or other signs of systemic illness.   PAST MEDICAL HISTORY: Patient Active Problem List   Diagnosis Date Noted  . Hypoxia 04/06/2014  . Cellulitis 04/06/2014  . Diabetes 04/06/2014  . S/P CABG x 3 04/06/2014  . Hyponatremia 04/06/2014  . Fever 04/06/2014  . Primary osteoarthritis of right knee 03/29/2014   Past Medical History  Diagnosis Date  . Hyperlipemia   . Diabetes mellitus without complication     DM II  . Arthritis   . H/O transfusion    Past Surgical History  Procedure Laterality Date  . Coronary artery bypass graft  11/10/1994    x 3  . Back surgery  1992  . Tubal ligation    . Carpal tunnel release Left   . Cardiac catheterization    . Colonoscopy    . Eye surgery Bilateral     Glaucoma  . Total knee arthroplasty Right 04/01/2014    Procedure: RIGHT TOTAL KNEE ARTHROPLASTY;  Surgeon: Gean Birchwood, MD;  Location: [facility] OR;  Service: Orthopedics;  Laterality: Right;     MEDICATIONS:   Current facility-administered medications:  .  0.9 %  sodium chloride infusion, , Intravenous, STAT, Loren Racer, MD, Last Rate: 75  mL/hr at 04/06/14 0745 .  0.9 %  sodium chloride infusion, , Intravenous, Continuous, Marianne L York, PA-C .  acetaminophen (TYLENOL) tablet 650 mg, 650 mg, Oral, Q6H PRN **OR** acetaminophen (TYLENOL) suppository 650 mg, 650 mg, Rectal, Q6H PRN, Tora Kindred York, PA-C .  alum & mag hydroxide-simeth (MAALOX/MYLANTA) 200-200-20 MG/5ML suspension 30 mL, 30 mL, Oral, Q6H PRN, Tora Kindred York, PA-C .  amLODipine-benazepril (LOTREL) 10-20 MG per capsule 1 capsule, 1 capsule, Oral, Daily, Tora Kindred York, PA-C .  aspirin EC tablet 325 mg, 325 mg, Oral, BID, Marianne L York, PA-C .  clopidogrel (PLAVIX) tablet 75 mg, 75 mg, Oral, Daily, Tora Kindred York, PA-C .  docusate sodium (COLACE) capsule 100 mg, 100 mg, Oral, BID, Marianne L York, PA-C .  enoxaparin (LOVENOX) injection 40 mg, 40 mg, Subcutaneous, Q24H, Marianne L York, PA-C .  HYDROcodone-acetaminophen (NORCO/VICODIN) 5-325 MG per tablet 1-2 tablet, 1-2 tablet, Oral, Q4H PRN, Tora Kindred York, PA-C .  insulin aspart (novoLOG) injection 0-15 Units, 0-15 Units, Subcutaneous, TID WC, Marianne L York, PA-C .  insulin aspart (novoLOG) injection 0-5 Units, 0-5 Units, Subcutaneous, QHS, Marianne L York, PA-C .  insulin aspart (novoLOG) injection 3 Units, 3 Units, Subcutaneous, TID WC, Stephani Police,  PA-C .  insulin glargine (LANTUS) injection 5 Units, 5 Units, Subcutaneous, Daily, Clerance Lav L York, PA-C .  morphine 2 MG/ML injection 2 mg, 2 mg, Intravenous, Q4H PRN, Tora Kindred York, PA-C .  ondansetron (ZOFRAN) tablet 4 mg, 4 mg, Oral, Q6H PRN **OR** ondansetron (ZOFRAN) injection 4 mg, 4 mg, Intravenous, Q6H PRN, Tora Kindred York, PA-C .  senna-docusate (Senokot-S) tablet 1 tablet, 1 tablet, Oral, QHS PRN, Tora Kindred York, PA-C .  sodium chloride 0.9 % injection 3 mL, 3 mL, Intravenous, Q12H, Marianne L York, PA-C  ALLERGIES:  No Known Allergies  REVIEW OF SYSTEMS: REVIEWED IN DETAIL IN CHART  FAMILY HISTORY:  No family history on file.  SOCIAL  HISTORY:   History  Substance Use Topics  . Smoking status: Never Smoker   . Smokeless tobacco: Not on file  . Alcohol Use: No     EXAMINATION: Vital signs in last 24 hours: Temp:  [99.3 F (37.4 C)-100.4 F (38 C)] 99.3 F (37.4 C) (03/26 0859) Pulse Rate:  [85-100] 89 (03/26 0859) Resp:  [17-26] 18 (03/26 0859) BP: (126-140)/(57-72) 139/64 mmHg (03/26 0859) SpO2:  [89 %-99 %] 97 % (03/26 0859) Weight:  [74.844 kg (165 lb)] 74.844 kg (165 lb) (03/26 0335)  General appearance: alert, cooperative, appears stated age and no distress Head: Normocephalic, without obvious abnormality, atraumatic Abdomen: soft and nontender Skin: Skin color, texture, turgor normal. No rashes or lesions  Musculoskeletal Exam  :Examination right knee shows a surgical incision that is healing well with no signs of infection.  Range of motion from 0-45.  No effusion.  Calf is soft and nontender.  Negative Homans.  She is neurovascular intact distally.   DIAGNOSTIC STUDIES: Recent laboratory studies:  Recent Labs  04/02/14 0520 04/03/14 0725 04/04/14 0612 04/06/14 0340  WBC 13.4* 13.0* 14.8* 8.0  HGB 12.3 11.6* 11.1* 10.0*  HCT 37.4 34.6* 34.2* 30.3*  PLT 269 213 211 258    Recent Labs  04/06/14 0340  NA 131*  K 4.0  CL 92*  CO2 27  BUN 19  CREATININE 0.79  GLUCOSE 198*  CALCIUM 8.6   Lab Results  Component Value Date   INR 1.07 03/21/2014     Recent Radiographic Studies :  Dg Chest 2 View  03/21/2014   CLINICAL DATA:  Preoperative evaluation  EXAM: CHEST  2 VIEW  COMPARISON:  None.  FINDINGS: Cardiac shadow is mildly enlarged. Postsurgical changes are seen. Coronary stent was noted. The lungs are clear bilaterally. No acute bony abnormality is seen.  IMPRESSION: No acute abnormality noted.   Electronically Signed   By: Alcide Clever M.D.   On: 03/21/2014 14:49   Dg Chest Port 1 View  04/06/2014   CLINICAL DATA:  Fever. Post right knee arthroplasty 5 days prior 04/01/2014.  EXAM:  PORTABLE CHEST - 1 VIEW  COMPARISON:  Chest radiograph 03/21/2014  FINDINGS: There is new elevation of right hemidiaphragm with opacity at the right lung base. The left lung is clear. Patient is post median sternotomy. Heart remains at the upper limits of normal in size. Pulmonary vasculature is normal. No pneumothorax.  IMPRESSION: Volume loss in the right hemithorax with elevation of right hemidiaphragm in opacity at the right lung base. This may reflect atelectasis related to mucous plugging versus pneumonia.   Electronically Signed   By: Rubye Oaks M.D.   On: 04/06/2014 06:03    ASSESSMENT: History of right TKR 5 days ago with fever of unknown origin.  PLAN: It does not appear that the patient's right TKR is infected at this time.  Her motion is good.  A single fever of 101 after surgery would not be abnormal.  She will continue on the medicine service for her possible pneumonia.  Orthopedics will continue to follow.  We appreciate the medical management by the medicine team.   Kayle Correa, Ginger OrganANDREW PAUL 04/06/2014, 9:01 AM

## 2014-04-07 ENCOUNTER — Inpatient Hospital Stay (HOSPITAL_COMMUNITY): Payer: Medicare Other

## 2014-04-07 DIAGNOSIS — E871 Hypo-osmolality and hyponatremia: Secondary | ICD-10-CM

## 2014-04-07 DIAGNOSIS — R509 Fever, unspecified: Secondary | ICD-10-CM

## 2014-04-07 DIAGNOSIS — Z951 Presence of aortocoronary bypass graft: Secondary | ICD-10-CM

## 2014-04-07 LAB — BASIC METABOLIC PANEL
Anion gap: 10 (ref 5–15)
BUN: 9 mg/dL (ref 6–23)
CHLORIDE: 101 mmol/L (ref 96–112)
CO2: 26 mmol/L (ref 19–32)
Calcium: 8.3 mg/dL — ABNORMAL LOW (ref 8.4–10.5)
Creatinine, Ser: 0.58 mg/dL (ref 0.50–1.10)
GFR calc Af Amer: >90 mL/min (ref >90)
GFR calc non Af Amer: 90 mL/min — ABNORMAL LOW (ref >90)
Glucose, Bld: 148 mg/dL — ABNORMAL HIGH (ref 70–99)
Potassium: 3.7 mmol/L (ref 3.5–5.1)
SODIUM: 137 mmol/L (ref 135–145)

## 2014-04-07 LAB — GLUCOSE, CAPILLARY
GLUCOSE-CAPILLARY: 202 mg/dL — AB (ref 70–99)
Glucose-Capillary: 161 mg/dL — ABNORMAL HIGH (ref 70–99)
Glucose-Capillary: 105 mg/dL — ABNORMAL HIGH (ref 70–99)
Glucose-Capillary: 100 mg/dL — ABNORMAL HIGH (ref 70–99)

## 2014-04-07 LAB — CBC
HCT: 29.8 % — ABNORMAL LOW (ref 36.0–46.0)
HEMOGLOBIN: 9.6 g/dL — AB (ref 12.0–15.0)
MCH: 28.5 pg (ref 26.0–34.0)
MCHC: 32.2 g/dL (ref 30.0–36.0)
MCV: 88.4 fL (ref 78.0–100.0)
Platelets: 271 10*3/uL (ref 150–400)
RBC: 3.37 MIL/uL — AB (ref 3.87–5.11)
RDW: 12.5 % (ref 11.5–15.5)
WBC: 6.4 10*3/uL (ref 4.0–10.5)

## 2014-04-07 LAB — HIV ANTIBODY (ROUTINE TESTING W REFLEX): HIV Screen 4th Generation wRfx: NONREACTIVE

## 2014-04-07 MED ORDER — AMPICILLIN-SULBACTAM SODIUM 3 (2-1) G IJ SOLR
3.0000 g | Freq: Four times a day (QID) | INTRAMUSCULAR | Status: DC
  Administered 2014-04-07 – 2014-04-08 (×4): 3 g via INTRAVENOUS
  Filled 2014-04-07 (×7): qty 3

## 2014-04-07 NOTE — Progress Notes (Signed)
Utilization review completed.  

## 2014-04-07 NOTE — Progress Notes (Signed)
Subjective:    patient is on medicine service for pneumonia. She is also 6 days s/p right tkr. She has minimal pain in her right knee. She denies any calf pain. She states that her knee is doing well.  Activity level:  wbat Diet tolerance:  Eating well Voiding:  ok Patient reports pain as mild.    Objective: Vital signs in last 24 hours: Temp:  [99.1 F (37.3 C)-99.5 F (37.5 C)] 99.1 F (37.3 C) (03/26 2224) Pulse Rate:  [82-90] 82 (03/26 2224) Resp:  [16-18] 16 (03/26 2224) BP: (121-125)/(60-70) 125/70 mmHg (03/26 2224) SpO2:  [98 %-99 %] 99 % (03/26 2224)  Labs:  Recent Labs  04/06/14 0340 04/07/14 0806  HGB 10.0* 9.6*    Recent Labs  04/06/14 0340 04/07/14 0806  WBC 8.0 6.4  RBC 3.51* 3.37*  HCT 30.3* 29.8*  PLT 258 271    Recent Labs  04/06/14 0340  NA 131*  K 4.0  CL 92*  CO2 27  BUN 19  CREATININE 0.79  GLUCOSE 198*  CALCIUM 8.6   No results for input(s): LABPT, INR in the last 72 hours.  Physical Exam:  Neurologically intact ABD soft Neurovascular intact Sensation intact distally Intact pulses distally Dorsiflexion/Plantar flexion intact Incision: dressing C/D/I No cellulitis present Compartment soft  Assessment/Plan:  Patient is 6 days s/p right TKR with pneumonia.  She is doing great from an orthopaedic standpoint in regards to her knee with no evidence of infection or DVT. She will continue WBAT and can get up with some PT. She will continue on the medicine service for treatment of pneumonia. We greatly appreciate medical management. We will continue to follow.     Mary [name], Ginger OrganNDREW PAUL 04/07/2014, 9:18 AM

## 2014-04-07 NOTE — Progress Notes (Signed)
Patient Demographics  Mary [name], is a 74 y.o. female, DOB - 1940-12-31, GMW:[id number]  Admit date - 04/06/2014   Admitting Physician Elease Etienne, MD  Outpatient Primary MD for the patient is HODGES,FRANCISCO, MD  LOS - 1   Chief Complaint  Patient presents with  . Post-op Problem  . Fever        Subjective:   Mary [name] today has, No headache, No chest pain, No abdominal pain - No Nausea, No new weakness tingling or numbness, improved Cough - SOB.    Assessment & Plan    1. Hypoxic respiratory failure secondary to HCAP/mucus plugging. Much improved. Added flutter valve, chest PT with vibrator, Unasyn for now, suspicion for active infection is low, supplemental treatment with nebs, o2 as needed, increase activity.   2. Recent right knee replacement. Orthopedically consulted, site appears clean, PT, weightbearing as tolerated. Lovenox for DVT prophylaxis. Lower estimate a venous duplex unremarkable.   3. DM type II. On Lantus & ISS monitor.  CBG (last 3)   Recent Labs  04/06/14 1715 04/06/14 2229 04/07/14 0825  GLUCAP 161* 108* 161*     4.HTN - stable on Norvasc & ACE   5.CAD post CABG - on aspirin and Plavix, no acute issues.     Code Status: Full  Family Communication: none  Disposition Plan: Home   Procedures    Consults Ortho   Medications  Scheduled Meds: . amLODipine  10 mg Oral Daily  . ampicillin-sulbactam (UNASYN) IV  3 g Intravenous Q6H  . aspirin EC  325 mg Oral BID  . benazepril  20 mg Oral Daily  . clopidogrel  75 mg Oral Daily  . docusate sodium  100 mg Oral BID  . enoxaparin (LOVENOX) injection  40 mg Subcutaneous Q24H  . insulin aspart  0-15 Units Subcutaneous TID WC  . insulin aspart  0-5 Units Subcutaneous QHS  . insulin aspart   3 Units Subcutaneous TID WC  . insulin glargine  5 Units Subcutaneous Daily  . sodium chloride  3 mL Intravenous Q12H   Continuous Infusions:  PRN Meds:.acetaminophen **OR** acetaminophen, alum & mag hydroxide-simeth, HYDROcodone-acetaminophen, morphine injection, ondansetron **OR** ondansetron (ZOFRAN) IV, senna-docusate  DVT Prophylaxis  Lovenox   Lab Results  Component Value Date   PLT 271 04/07/2014    Antibiotics    Anti-infectives    Start     Dose/Rate Route Frequency Ordered Stop   04/07/14 0900  Ampicillin-Sulbactam (UNASYN) 3 g in sodium chloride 0.9 % 100 mL IVPB     3 g 100 mL/hr over 60 Minutes Intravenous Every 6 hours 04/07/14 0808     04/06/14 0730  levofloxacin (LEVAQUIN) IVPB 500 mg  Status:  Discontinued     500 mg 100 mL/hr over 60 Minutes Intravenous Every 24 hours 04/06/14 0636 04/06/14 0854   04/06/14 0645  vancomycin (VANCOCIN) IVPB 1000 mg/200 mL premix     1,000 mg 200 mL/hr over 60 Minutes Intravenous  Once 04/06/14 0636 04/06/14 0844   04/06/14 0545  ceFAZolin (ANCEF) IVPB 2 g/50 mL premix     2 g 100 mL/hr over 30 Minutes Intravenous  Once 04/06/14 0542 04/06/14 0742          Objective:  Filed Vitals:   04/06/14 0859 04/06/14 0903 04/06/14 1421 04/06/14 2224  BP: 139/64  121/60 125/70  Pulse: 89  90 82  Temp: 99.3 F (37.4 C)  99.5 F (37.5 C) 99.1 F (37.3 C)  TempSrc: Oral  Oral Oral  Resp: Height:   (1.676 m)    Weight:  78.9 kg (173 lb 15.1 oz)    SpO2: 97%  98% 99%    Wt Readings from Last 3 Encounters:  04/06/14 78.9 kg (173 lb 15.1 oz)  04/01/14 77.565 kg (171 lb)     Intake/Output Summary (Last 24 hours) at 04/07/14 1027 Last data filed at 04/07/14 0926  Gross per 24 hour  Intake 1597.5 ml  Output   1575 ml  Net   22.5 ml     Physical Exam  Awake Alert, Oriented X 3, No new F.N deficits, Normal affect [place].AT,PERRAL Supple Neck,No JVD, No cervical lymphadenopathy appriciated.  Symmetrical  Chest wall movement, Good air movement bilaterally, CTAB RRR,No Gallops,Rubs or new Murmurs, No Parasternal Heave +ve B.Sounds, Abd Soft, No tenderness, No organomegaly appriciated, No rebound - guarding or rigidity. No Cyanosis, Clubbing or edema, No new Rash or bruise , R knee sight stable    Data Review   Micro Results Recent Results (from the past 240 hour(s))  Culture, blood (routine x 2)     Status: None (Preliminary result)   Collection Time: 04/06/14  3:43 AM  Result Value Ref Range Status   Specimen Description BLOOD LEFT ARM  Final   Special Requests BOTTLES DRAWN AEROBIC AND ANAEROBIC 10CC  Final   Culture   Final           BLOOD CULTURE RECEIVED NO GROWTH TO DATE CULTURE WILL BE HELD FOR 5 DAYS BEFORE ISSUING A FINAL NEGATIVE REPORT Performed at [facility]    Report Status PENDING  Incomplete  Culture, blood (routine x 2)     Status: None (Preliminary result)   Collection Time: 04/06/14  6:58 AM  Result Value Ref Range Status   Specimen Description BLOOD RIGHT HAND  Final   Special Requests BOTTLES DRAWN AEROBIC AND ANAEROBIC 10CC  Final   Culture   Final           BLOOD CULTURE RECEIVED NO GROWTH TO DATE CULTURE WILL BE HELD FOR 5 DAYS BEFORE ISSUING A FINAL NEGATIVE REPORT Performed at [facility]    Report Status PENDING  Incomplete  MRSA PCR Screening     Status: None   Collection Time: 04/06/14 11:29 AM  Result Value Ref Range Status   MRSA by PCR NEGATIVE NEGATIVE Final    Comment:        The GeneXpert MRSA Assay (FDA approved for NASAL specimens only), is one component of a comprehensive MRSA colonization surveillance program. It is not intended to diagnose MRSA infection nor to guide or monitor treatment for MRSA infections.     Radiology Reports Dg Chest 2 View  03/21/2014   CLINICAL DATA:  Preoperative evaluation  EXAM: CHEST  2 VIEW  COMPARISON:  None.  FINDINGS: Cardiac shadow is mildly enlarged. Postsurgical changes are  seen. Coronary stent was noted. The lungs are clear bilaterally. No acute bony abnormality is seen.  IMPRESSION: No acute abnormality noted.   Electronically Signed   By: Alcide Clever M.D.   On: 03/21/2014 14:49   Dg Chest Port 1 View  04/06/2014   CLINICAL DATA:  Fever. Post right  knee arthroplasty 5 days prior 04/01/2014.  EXAM: PORTABLE CHEST - 1 VIEW  COMPARISON:  Chest radiograph 03/21/2014  FINDINGS: There is new elevation of right hemidiaphragm with opacity at the right lung base. The left lung is clear. Patient is post median sternotomy. Heart remains at the upper limits of normal in size. Pulmonary vasculature is normal. No pneumothorax.  IMPRESSION: Volume loss in the right hemithorax with elevation of right hemidiaphragm in opacity at the right lung base. This may reflect atelectasis related to mucous plugging versus pneumonia.   Electronically Signed   By: Rubye OaksMelanie  Ehinger M.D.   On: 04/06/2014 06:03     CBC  Recent Labs Lab 04/02/14 0520 04/03/14 0725 04/04/14 0612 04/06/14 0340 04/07/14 0806  WBC 13.4* 13.0* 14.8* 8.0 6.4  HGB 12.3 11.6* 11.1* 10.0* 9.6*  HCT 37.4 34.6* 34.2* 30.3* 29.8*  PLT 269 213 211 258 271  MCV 87.8 87.6 88.6 86.3 88.4  MCH 28.9 29.4 28.8 28.5 28.5  MCHC 32.9 33.5 32.5 33.0 32.2  RDW 12.4 12.5 12.5 12.5 12.5  LYMPHSABS  --   --   --  0.9  --   MONOABS  --   --   --  0.7  --   EOSABS  --   --   --  0.1  --   BASOSABS  --   --   --  0.0  --     Chemistries   Recent Labs Lab 04/06/14 0340 04/07/14 0806  NA 131* 137  K 4.0 3.7  CL 92* 101  CO2 27 26  GLUCOSE 198* 148*  BUN 19 9  CREATININE 0.79 0.58  CALCIUM 8.6 8.3*  AST 26  --   ALT 24  --   ALKPHOS 53  --   BILITOT 0.9  --    ------------------------------------------------------------------------------------------------------------------ estimated creatinine clearance is 67.3 mL/min (by C-G formula based on Cr of  0.58). ------------------------------------------------------------------------------------------------------------------ No results for input(s): HGBA1C in the last 72 hours. ------------------------------------------------------------------------------------------------------------------ No results for input(s): CHOL, HDL, LDLCALC, TRIG, CHOLHDL, LDLDIRECT in the last 72 hours. ------------------------------------------------------------------------------------------------------------------ No results for input(s): TSH, T4TOTAL, T3FREE, THYROIDAB in the last 72 hours.  Invalid input(s): FREET3 ------------------------------------------------------------------------------------------------------------------ No results for input(s): VITAMINB12, FOLATE, FERRITIN, TIBC, IRON, RETICCTPCT in the last 72 hours.  Coagulation profile No results for input(s): INR, PROTIME in the last 168 hours.  No results for input(s): DDIMER in the last 72 hours.  Cardiac Enzymes No results for input(s): CKMB, TROPONINI, MYOGLOBIN in the last 168 hours.  Invalid input(s): CK ------------------------------------------------------------------------------------------------------------------ Invalid input(s): POCBNP     Time Spent in minutes   35   SINGH,PRASHANT K M.D on 04/07/2014 at 10:27 AM  Between 7am to 7pm - Pager - [phone]  After 7pm go to [email or web address] - password [email or web address]  [facility]   Office  [phone]

## 2014-04-07 NOTE — Evaluation (Signed)
Physical Therapy Evaluation Patient Details Name: Mary [name] MRN: [id number] DOB: Dec 04, 1940 Today's Date: 04/07/2014   History of Present Illness  74 y.o. female with recent R TKA. on 04/01/14 admitted now with fever and hypoxia.  Pt with significant PMHx of DM, CABG, back surgery (1992), L carpal tunnel release, and B eye surgery for glaucoma.   Clinical Impression  Patient doing well with mobility.  Still limited in ROM due to recent TKR.  Will benefit from PT to advance ROM and progress activity to increase independence with mobility.      Follow Up Recommendations Home health PT    Equipment Recommendations  None recommended by PT    Recommendations for Other Services       Precautions / Restrictions Precautions Precautions: Knee Precaution Comments: reviewed no pillow under knee and positioning with foam or pillow under heel Restrictions RLE Weight Bearing: Weight bearing as tolerated      Mobility  Bed Mobility               General bed mobility comments: in recliner upon arrival  Transfers Overall transfer level: Needs assistance Equipment used: Rolling walker (2 wheeled) Transfers: Sit to/from Stand Sit to Stand: Supervision         General transfer comment: required verbal cues for hand placement  Ambulation/Gait Ambulation/Gait assistance: Modified independent (Device/Increase time) Ambulation Distance (Feet): 150 Feet Assistive device: Rolling walker (2 wheeled) Gait Pattern/deviations: Step-through pattern;Antalgic Gait velocity: decreased   General Gait Details: slight antalgic gait  Stairs            Wheelchair Mobility    Modified Rankin (Stroke Patients Only)       Balance Overall balance assessment: Needs assistance Sitting-balance support: No upper extremity supported Sitting balance-Leahy Scale: Good     Standing balance support: Bilateral upper extremity supported Standing balance-Leahy Scale: Poor                               Pertinent Vitals/Pain Pain Assessment: Faces Faces Pain Scale: Hurts little more Pain Location: right knee (during exercises), at rest 0/10 Pain Intervention(s): Limited activity within patient's tolerance;Monitored during session    Home Living Family/patient expects to be discharged to:: Private residence Living Arrangements: Children;Other relatives (two sons and two grandchildren) Available Help at Discharge: Family;Available 24 hours/day Type of Home: Apartment (2nd floor) Home Access: Stairs to enter   Entrance Stairs-Number of Steps: 7 Home Layout: One level Home Equipment: None      Prior Function Level of Independence: Independent with assistive device(s)         Comments: after recent discharge      Hand Dominance   Dominant Hand: Right    Extremity/Trunk Assessment   Upper Extremity Assessment: Overall WFL for tasks assessed           Lower Extremity Assessment: RLE deficits/detail RLE Deficits / Details: limited ROM and strength due to recent TKR.  Grossly at least 3/5 all movements.         Communication   Communication: Prefers language other than English (spanish, interpreter present for OT eval)  Cognition Arousal/Alertness: Awake/alert Behavior During Therapy: WFL for tasks assessed/performed Overall Cognitive Status: Within Functional Limits for tasks assessed                      General Comments      Exercises Total Joint Exercises Ankle Circles/Pumps: AROM;Both;10 reps;Seated Quad Sets:  AROM;Both;10 reps;Seated Long Arc Quad: AROM;Right;10 reps;Seated Knee Flexion: AAROM;Right;10 reps;Seated Marching in Standing: AROM;Right;10 reps;Standing Standing Hip Extension: AROM;Right;10 reps;Standing Other Exercises Other Exercises: Standing knee flexion - AAROM, 5 reps in standing.  Patient complained of pain during exercise and limited repetitions.        Assessment/Plan    PT Assessment Patient  needs continued PT services  PT Diagnosis Difficulty walking   PT Problem List Decreased strength;Decreased range of motion;Decreased balance;Decreased mobility;Pain  PT Treatment Interventions DME instruction;Gait training;Stair training;Functional mobility training;Therapeutic exercise;Balance training   PT Goals (Current goals can be found in the Care Plan section) Acute Rehab PT Goals Patient Stated Goal: go home PT Goal Formulation: With patient/family Time For Goal Achievement: 04/14/14 Potential to Achieve Goals: Good    Frequency Min 6X/week   Barriers to discharge        Co-evaluation               End of Session   Activity Tolerance: Patient tolerated treatment well Patient left: in chair;with family/visitor present;with call bell/phone within reach           Time: 1040-1104 PT Time Calculation (min) (ACUTE ONLY): 24 min   Charges:   PT Evaluation $Initial PT Evaluation Tier I: 1 Procedure PT Treatments $Therapeutic Exercise: 8-22 mins   PT G CodesOlivia Canter:        Zackory Pudlo M 04/07/2014, 11:14 AM  04/07/2014 Corlis HoveMargie Vladislav Axelson, PT [phone]

## 2014-04-07 NOTE — Progress Notes (Addendum)
MD Thedore MinsSingh paged, [facility] tech states it looks like patient has thrombus in apex of heart and needs [facility] WITH contrast and drug called definity (perflutren lipid microsphere) ordered for definitive diagnosis.

## 2014-04-07 NOTE — Progress Notes (Signed)
ANTIBIOTIC CONSULT NOTE - INITIAL  Pharmacy Consult for Unasyn Indication: pneumonia  No Known Allergies  Patient Measurements: Height: 5\' 6"  (167.6 cm) Weight: 173 lb 15.1 oz (78.9 kg) IBW/kg (Calculated) : 59.3  Vital Signs: Temp: 99.1 [name] (37.3 C) (03/26 2224) Temp Source: Oral (03/26 2224) BP: 125/70 mmHg (03/26 2224) Pulse Rate: 82 (03/26 2224) Intake/Output from previous day: 03/26 0701 - 03/27 0700 In: 1237.5 [P.O.:600; I.V.:637.5] Out: 1525 [Urine:1525] Intake/Output from this shift: Total I/O In: -  Out: 300 [Urine:300]  Labs:  Recent Labs  04/06/14 0340  WBC 8.0  HGB 10.0*  PLT 258  CREATININE 0.79   Estimated Creatinine Clearance: 67.3 mL/min (by C-G formula based on Cr of 0.79). No results for input(s): VANCOTROUGH, VANCOPEAK, VANCORANDOM, GENTTROUGH, GENTPEAK, GENTRANDOM, TOBRATROUGH, TOBRAPEAK, TOBRARND, AMIKACINPEAK, AMIKACINTROU, AMIKACIN in the last 72 hours.   Microbiology: Recent Results (from the past 720 hour(s))  Surgical pcr screen     Status: Abnormal   Collection Time: 03/21/14 11:14 AM  Result Value Ref Range Status   MRSA, PCR POSITIVE (A) NEGATIVE Final   Staphylococcus aureus POSITIVE (A) NEGATIVE Final    Comment:        The Xpert SA Assay (FDA approved for NASAL specimens in patients over 73 years of age), is one component of a comprehensive surveillance program.  Test performance has been validated by [facility] for patients greater than or equal to 73 year old. It is not intended to diagnose infection nor to guide or monitor treatment.   Culture, blood (routine x 2)     Status: None (Preliminary result)   Collection Time: 04/06/14  3:43 AM  Result Value Ref Range Status   Specimen Description BLOOD LEFT ARM  Final   Special Requests BOTTLES DRAWN AEROBIC AND ANAEROBIC 10CC  Final   Culture   Final           BLOOD CULTURE RECEIVED NO GROWTH TO DATE CULTURE WILL BE HELD FOR 5 DAYS BEFORE ISSUING A FINAL NEGATIVE  REPORT Performed at [facility]    Report Status PENDING  Incomplete  Culture, blood (routine x 2)     Status: None (Preliminary result)   Collection Time: 04/06/14  6:58 AM  Result Value Ref Range Status   Specimen Description BLOOD RIGHT HAND  Final   Special Requests BOTTLES DRAWN AEROBIC AND ANAEROBIC 10CC  Final   Culture   Final           BLOOD CULTURE RECEIVED NO GROWTH TO DATE CULTURE WILL BE HELD FOR 5 DAYS BEFORE ISSUING A FINAL NEGATIVE REPORT Performed at [facility]    Report Status PENDING  Incomplete  MRSA PCR Screening     Status: None   Collection Time: 04/06/14 11:29 AM  Result Value Ref Range Status   MRSA by PCR NEGATIVE NEGATIVE Final    Comment:        The GeneXpert MRSA Assay (FDA approved for NASAL specimens only), is one component of a comprehensive MRSA colonization surveillance program. It is not intended to diagnose MRSA infection nor to guide or monitor treatment for MRSA infections.     Medical History: Past Medical History  Diagnosis Date  . Hyperlipemia   . Diabetes mellitus without complication     DM II  . Arthritis   . H/O transfusion     Medications:  Prescriptions prior to admission  Medication Sig Dispense Refill Last Dose  . amLODipine-benazepril (LOTREL) 10-20 MG per capsule Take 1  capsule by mouth daily.   04/05/2014 at Unknown time  . clopidogrel (PLAVIX) 75 MG tablet Take 75 mg by mouth daily.   04/05/2014 at Unknown time  . oxyCODONE-acetaminophen (ROXICET) 5-325 MG per tablet Take 1 tablet by mouth every 4 (four) hours as needed. 60 tablet 0 04/05/2014 at Unknown time  . SitaGLIPtin-MetFORMIN HCl 50-1000 MG TB24 Take 1 tablet by mouth daily.   04/05/2014 at Unknown time  . aspirin EC 325 MG tablet Take 1 tablet (325 mg total) by mouth 2 (two) times daily. (Patient not taking: Reported on 04/06/2014) 30 tablet 0 Not Taking at Unknown time  . fenofibrate (TRICOR) 145 MG tablet Take 145 mg by mouth daily.   More  than a month at Unknown time   Assessment: Mary [name] admitted 04/06/2014 with decreased oxygen status and fever after recent total right knee replacement.  Pharmacy consulted to dose unasyn.  Coag: VTE Px, recent knee surgery (ASA Rx PTA) now Enox 40 daily  ID: pneumonia, with recent contact with grandson who had strep throat.  Tm 101, now afebrile, wbc wnl 3/26 LQ x 1 3/27 Unasyn>>  Goal of Therapy:  Renal adjustment of antibiotics.  Plan:  Unasyn 3g IV q6h Follow up SCr, UOP, cultures, clinical course and adjust as clinically indicated.  Thank you for allowing pharmacy to be a part of this patients care team.  Lovenia Kim Pharm.D., BCPS, AQ-Cardiology Clinical Pharmacist 04/07/2014 8:39 AM Pager: [phone] Phone: [phone]

## 2014-04-07 NOTE — Progress Notes (Signed)
  Echocardiogram 2D Echocardiogram has been performed.  Mary [name], Mary [name] A 04/07/2014, 1:15 PM

## 2014-04-08 LAB — LEGIONELLA ANTIGEN, URINE

## 2014-04-08 LAB — BASIC METABOLIC PANEL
ANION GAP: 8 (ref 5–15)
BUN: 9 mg/dL (ref 6–23)
CALCIUM: 8.8 mg/dL (ref 8.4–10.5)
CO2: 28 mmol/L (ref 19–32)
Chloride: 101 mmol/L (ref 96–112)
Creatinine, Ser: 0.6 mg/dL (ref 0.50–1.10)
GFR calc Af Amer: >90 mL/min (ref >90)
GFR, EST NON AFRICAN AMERICAN: 89 mL/min — AB (ref >90)
Glucose, Bld: 138 mg/dL — ABNORMAL HIGH (ref 70–99)
POTASSIUM: 3.9 mmol/L (ref 3.5–5.1)
SODIUM: 137 mmol/L (ref 135–145)

## 2014-04-08 LAB — CBC
HCT: 32.1 % — ABNORMAL LOW (ref 36.0–46.0)
HEMOGLOBIN: 10.5 g/dL — AB (ref 12.0–15.0)
MCH: 28.7 pg (ref 26.0–34.0)
MCHC: 32.7 g/dL (ref 30.0–36.0)
MCV: 87.7 fL (ref 78.0–100.0)
Platelets: 338 10*3/uL (ref 150–400)
RBC: 3.66 MIL/uL — ABNORMAL LOW (ref 3.87–5.11)
RDW: 12.6 % (ref 11.5–15.5)
WBC: 7 10*3/uL (ref 4.0–10.5)

## 2014-04-08 LAB — GLUCOSE, CAPILLARY: Glucose-Capillary: 132 mg/dL — ABNORMAL HIGH (ref 70–99)

## 2014-04-08 MED ORDER — WHITE PETROLATUM GEL
Status: AC
Start: 2014-04-08 — End: 2014-04-08
  Administered 2014-04-08: 1
  Filled 2014-04-08: qty 1

## 2014-04-08 MED ORDER — AMOXICILLIN-POT CLAVULANATE 875-125 MG PO TABS: 1.0000 | ORAL_TABLET | Freq: Two times a day (BID) | ORAL | Status: DC

## 2014-04-08 NOTE — Discharge Summary (Signed)
Jaicee Michelotti, is a 74 y.o. female  DOB 06-13-1940  MRN [id number].  Admission date:  04/06/2014  Admitting Physician  Elease Etienne, MD  Discharge Date:  04/08/2014   Primary MD  HODGES,FRANCISCO, MD  Recommendations for primary care physician for things to follow:    CBC, BMP repeat two-view chest x-ray and a week   Admission Diagnosis  Fever [R50.9] HCAP (healthcare-associated pneumonia) [J18.9]   Discharge Diagnosis  Fever [R50.9] HCAP (healthcare-associated pneumonia) [J18.9]     Principal Problem:   Fever Active Problems:   Hypoxia   Cellulitis   Diabetes   S/P CABG x 3   Hyponatremia      Past Medical History  Diagnosis Date  . Hyperlipemia   . Diabetes mellitus without complication     DM II  . Arthritis   . H/O transfusion     Past Surgical History  Procedure Laterality Date  . Coronary artery bypass graft  11/10/1994    x 3  . Back surgery  1992  . Tubal ligation    . Carpal tunnel release Left   . Cardiac catheterization    . Colonoscopy    . Eye surgery Bilateral     Glaucoma  . Total knee arthroplasty Right 04/01/2014    Procedure: RIGHT TOTAL KNEE ARTHROPLASTY;  Surgeon: Gean Birchwood, MD;  Location: [facility] OR;  Service: Orthopedics;  Laterality: Right;       History of present illness and  Hospital Course:     Kindly see H&P for history of present illness and admission details, please review complete Labs, Consult reports and Test reports for all details in brief  HPI  from the history and physical done on the day of admission  Seema Class, 74 y.o. female, has a history of pain and functional disability in the right knee due to arthritis and has failed non-surgical conservative treatments for greater than 12 weeks to includeNSAID's and/or analgesics, corticosteriod  injections, viscosupplementation injections, use of assistive devices, weight reduction as appropriate and activity modification. Onset of symptoms was gradual, starting several years ago with gradually worsening course since that time. The patient noted no past surgery on the right knee(s). Patient currently rates pain in the right knee(s) at 10 out of 10 with activity. Patient has night pain, worsening of pain with activity and weight bearing, pain that interferes with activities of daily living, pain with passive range of motion, crepitus and joint swelling. Patient has evidence of periarticular osteophytes, joint subluxation and joint space narrowing by imaging studies. There is no active infection.   Hospital Course      1. Hypoxic respiratory failure secondary to HCAP/mucus plugging. Much improved. Added flutter valve, chest PT with vibrator, Unasyn was given, now she is back to baseline, no shortness of breath cough or fever, will be placed on Augmentin for 5 more days, suspicion for active infection is low this might have been mucous plugging. She has no oxygen demand and eager to go home.   2. Recent right  knee replacement. Orthopedically consulted, site appears clean, PT, weightbearing as tolerated. She was not on chemical anticoagulation upon her last discharge after knee surgery by orthopedics. Lower leg venous duplex unremarkable.   3. DM type II. Her home regimen no acute issues.             4.HTN - stable on Norvasc & ACE   5.CAD post CABG - on aspirin and Plavix, no acute issues         Discharge Condition: Stable   Follow UP  Follow-up Information    Follow up with HODGES,FRANCISCO, MD. Schedule an appointment as soon as possible for a visit in 1 week.   Specialty:  Family Medicine      Follow up with Nestor LewandowskyOWAN,FRANK J, MD. Schedule an appointment as soon as possible for a visit in 1 week.   Specialty:  Orthopedic Surgery   Contact information:   [address] [place] [id number] [phone]         Discharge Instructions  and  Discharge Medications     Discharge Instructions    Discharge instructions    Complete by:  As directed   Follow with Primary MD HODGES,FRANCISCO, MD in 7 days   Get CBC, CMP, 2 view Chest X ray checked  by Primary MD next visit.    Activity: Weightbearing as tolerated on right knee, use walker as before. Keep right knee postop site clean and dry at all times. As tolerated with Full fall precautions use walker/cane & assistance as needed   Disposition Home     Diet: Heart Healthy low carbohydrate.  For Heart failure patients - Check your Weight same time everyday, if you gain over 2 pounds, or you develop in leg swelling, experience more shortness of breath or chest pain, call your Primary MD immediately. Follow Cardiac Low Salt Diet and 1.5 lit/day fluid restriction.   On your next visit with your primary care physician please Get Medicines reviewed and adjusted.   Please request your Prim.MD to go over all Hospital Tests and Procedure/Radiological results at the follow up, please get all Hospital records sent to your Prim MD by signing hospital release before you go home.   If you experience worsening of your admission symptoms, develop shortness of breath, life threatening emergency, suicidal or homicidal thoughts you must seek medical attention immediately by calling 911 or calling your MD immediately  if symptoms less severe.  You Must read complete instructions/literature along with all the possible adverse reactions/side effects for all the Medicines you take and that have been prescribed to you. Take any new Medicines after you have completely understood and accpet all the possible adverse reactions/side effects.   Do not drive, operating heavy machinery, perform activities at heights, swimming or participation in water activities or provide baby sitting services if your were admitted for  syncope or siezures until you have seen by Primary MD or a Neurologist and advised to do so again.  Do not drive when taking Pain medications.    Do not take more than prescribed Pain, Sleep and Anxiety Medications  Special Instructions: If you have smoked or chewed Tobacco  in the last 2 yrs please stop smoking, stop any regular Alcohol  and or any Recreational drug use.  Wear Seat belts while driving.   Please note  You were cared for by a hospitalist during your hospital stay. If you have any questions about your discharge medications or the care you received while you were  in the hospital after you are discharged, you can call the unit and asked to speak with the hospitalist on call if the hospitalist that took care of you is not available. Once you are discharged, your primary care physician will handle any further medical issues. Please note that NO REFILLS for any discharge medications will be authorized once you are discharged, as it is imperative that you return to your primary care physician (or establish a relationship with a primary care physician if you do not have one) for your aftercare needs so that they can reassess your need for medications and monitor your lab values.     Increase activity slowly    Complete by:  As directed             Medication List    TAKE these medications        amLODipine-benazepril 10-20 MG per capsule  Commonly known as:  LOTREL  Take 1 capsule by mouth daily.     amoxicillin-clavulanate 875-125 MG per tablet  Commonly known as:  AUGMENTIN  Take 1 tablet by mouth 2 (two) times daily.     aspirin EC 325 MG tablet  Take 1 tablet (325 mg total) by mouth 2 (two) times daily.     clopidogrel 75 MG tablet  Commonly known as:  PLAVIX  Take 75 mg by mouth daily.     fenofibrate 145 MG tablet  Commonly known as:  TRICOR  Take 145 mg by mouth daily.     oxyCODONE-acetaminophen 5-325 MG per tablet  Commonly known as:  ROXICET  Take 1  tablet by mouth every 4 (four) hours as needed.     SitaGLIPtin-MetFORMIN HCl 50-1000 MG Tb24  Take 1 tablet by mouth daily.          Diet and Activity recommendation: See Discharge Instructions above   Consults obtained - Ortho   Major procedures and Radiology Reports - PLEASE review detailed and final reports for all details, in brief -    Venous duplex right leg unremarkable.   TTE  - LVEF 60-65%, normal wall motion, mild LVH, diastolic dysfunction,indeterminate LV filling pressure, NO LV THROMBUS by contrast imaging, there is a calcified apical false tendon (not significant), top normal aortic root size, RV systolic function appears reduced somewhat.    Dg Chest 2 View  04/07/2014   CLINICAL DATA:  74 year old female with shortness of breath today. Healthcare associated pneumonia. Initial encounter.  EXAM: CHEST  2 VIEW  COMPARISON:  04/06/2014 and earlier.  FINDINGS: Mildly lower lung volumes overall but disproportionate lung volume loss persists at the right lung base, new since 03/21/2014. No right pleural effusion or consolidation is identified. Mild streaky right lung base opacity primarily on the frontal view. The left lung remains negative. No pneumothorax or pulmonary edema. Sequelae of median sternotomy. Stable cardiac size and mediastinal contours. Calcified atherosclerosis of the aorta. Visualized tracheal air column is within normal limits. No acute osseous abnormality identified.  IMPRESSION: Lower lung volumes than on 03/21/2014 but with disproportion volume loss at the right lung base. This is nonspecific but could reflect bronchopneumonia. No associated pleural effusion or consolidation.   Electronically Signed   By: Odessa Fleming M.D.   On: 04/07/2014 14:50   Dg Chest 2 View  03/21/2014   CLINICAL DATA:  Preoperative evaluation  EXAM: CHEST  2 VIEW  COMPARISON:  None.  FINDINGS: Cardiac shadow is mildly enlarged. Postsurgical changes are seen. Coronary stent was  noted. The lungs  are clear bilaterally. No acute bony abnormality is seen.  IMPRESSION: No acute abnormality noted.   Electronically Signed   By: Alcide Clever M.D.   On: 03/21/2014 14:49   Dg Chest Port 1 View  04/06/2014   CLINICAL DATA:  Fever. Post right knee arthroplasty 5 days prior 04/01/2014.  EXAM: PORTABLE CHEST - 1 VIEW  COMPARISON:  Chest radiograph 03/21/2014  FINDINGS: There is new elevation of right hemidiaphragm with opacity at the right lung base. The left lung is clear. Patient is post median sternotomy. Heart remains at the upper limits of normal in size. Pulmonary vasculature is normal. No pneumothorax.  IMPRESSION: Volume loss in the right hemithorax with elevation of right hemidiaphragm in opacity at the right lung base. This may reflect atelectasis related to mucous plugging versus pneumonia.   Electronically Signed   By: Rubye Oaks M.D.   On: 04/06/2014 06:03    Micro Results     Recent Results (from the past 240 hour(s))  Culture, blood (routine x 2)     Status: None (Preliminary result)   Collection Time: 04/06/14  3:43 AM  Result Value Ref Range Status   Specimen Description BLOOD LEFT ARM  Final   Special Requests BOTTLES DRAWN AEROBIC AND ANAEROBIC 10CC  Final   Culture   Final           BLOOD CULTURE RECEIVED NO GROWTH TO DATE CULTURE WILL BE HELD FOR 5 DAYS BEFORE ISSUING A FINAL NEGATIVE REPORT Note: Culture results may be compromised due to an excessive volume of blood received in culture bottles. Performed at [facility]    Report Status PENDING  Incomplete  Culture, blood (routine x 2)     Status: None (Preliminary result)   Collection Time: 04/06/14  6:58 AM  Result Value Ref Range Status   Specimen Description BLOOD RIGHT HAND  Final   Special Requests BOTTLES DRAWN AEROBIC AND ANAEROBIC 10CC  Final   Culture   Final           BLOOD CULTURE RECEIVED NO GROWTH TO DATE CULTURE WILL BE HELD FOR 5 DAYS BEFORE ISSUING A FINAL NEGATIVE  REPORT Note: Culture results may be compromised due to an excessive volume of blood received in culture bottles. Performed at [facility]    Report Status PENDING  Incomplete  MRSA PCR Screening     Status: None   Collection Time: 04/06/14 11:29 AM  Result Value Ref Range Status   MRSA by PCR NEGATIVE NEGATIVE Final    Comment:        The GeneXpert MRSA Assay (FDA approved for NASAL specimens only), is one component of a comprehensive MRSA colonization surveillance program. It is not intended to diagnose MRSA infection nor to guide or monitor treatment for MRSA infections.        Today   Subjective:   Tayloranne Lekas today has no headache,no chest abdominal pain,no new weakness tingling or numbness, feels much better wants to go home today.    Objective:   Blood pressure 136/74, pulse 74, temperature 98.4 F (36.9 C), temperature source Oral, resp. rate 18, height  (1.676 m), weight 78.9 kg (173 lb 15.1 oz), SpO2 96 %.   Intake/Output Summary (Last 24 hours) at 04/08/14 0859 Last data filed at 04/08/14 0500  Gross per 24 hour  Intake   1320 ml  Output      0 ml  Net   1320 ml    Exam Awake  Alert, Oriented x 3, No new F.N deficits, Normal affect [place].AT,PERRAL Supple Neck,No JVD, No cervical lymphadenopathy appriciated.  Symmetrical Chest wall movement, Good air movement bilaterally, CTAB RRR,No Gallops,Rubs or new Murmurs, No Parasternal Heave +ve B.Sounds, Abd Soft, Non tender, No organomegaly appriciated, No rebound -guarding or rigidity. No Cyanosis, Clubbing or edema, No new Rash or bruise, right knee postop site clean and dry  Data Review   CBC w Diff: Lab Results  Component Value Date   WBC 7.0 04/08/2014   HGB 10.5* 04/08/2014   HCT 32.1* 04/08/2014   PLT 338 04/08/2014   LYMPHOPCT 11* 04/06/2014   MONOPCT 9 04/06/2014   EOSPCT 1 04/06/2014   BASOPCT 0 04/06/2014    CMP: Lab Results  Component Value Date   NA 137 04/07/2014   K 3.7  04/07/2014   CL 101 04/07/2014   CO2 26 04/07/2014   BUN 9 04/07/2014   CREATININE 0.58 04/07/2014   PROT 6.3 04/06/2014   ALBUMIN 3.0* 04/06/2014   BILITOT 0.9 04/06/2014   ALKPHOS 53 04/06/2014   AST 26 04/06/2014   ALT 24 04/06/2014  .   Total Time in preparing paper work, data evaluation and todays exam - 35 minutes  Leroy Sea M.D on 04/08/2014 at 8:59 AM  [facility]   Office  [phone]

## 2014-04-08 NOTE — Progress Notes (Signed)
Physical Therapy Treatment Patient Details Name: Mary [name] MRN: [id number] DOB: 1940-12-22 Today's Date: 04/08/2014    History of Present Illness 74 y.o. female with recent R TKA. on 04/01/14 admitted now with fever and hypoxia.  Pt with significant PMHx of DM, CABG, back surgery (1992), L carpal tunnel release, and B eye surgery for glaucoma.     PT Comments    Pt was seen for evaluation of current status and is imminently leaving per family.  Her son was there to assist with pt's understanding and effort and to be instructed on HEP as well.  Pt has minor pain with stretchto extend R knee, and flexion is very mobile now compared to first visit.  Pt had her R foot on the ground in the chair with more normal flexion today.   Follow Up Recommendations  Home health PT     Equipment Recommendations  None recommended by PT    Recommendations for Other Services       Precautions / Restrictions Precautions Precautions: Knee Restrictions Weight Bearing Restrictions: Yes RLE Weight Bearing: Weight bearing as tolerated    Mobility  Bed Mobility               General bed mobility comments: up when PT entered  Transfers                 General transfer comment: declined leaving shortly  Ambulation/Gait             General Gait Details: did not walk but up with staff   Stairs            Wheelchair Mobility    Modified Rankin (Stroke Patients Only)       Balance                                    Cognition Arousal/Alertness: Awake/alert Behavior During Therapy: WFL for tasks assessed/performed Overall Cognitive Status: Within Functional Limits for tasks assessed                      Exercises Total Joint Exercises Ankle Circles/Pumps: AROM;Both;10 reps;Seated Quad Sets: AROM;Both;10 reps;Seated Gluteal Sets: AROM;Both;10 reps Hip ABduction/ADduction: AROM;Both;10 reps Long Arc Quad: Strengthening;Right;10 reps Knee  Flexion: Strengthening;Right;10 reps Goniometric ROM: -10 to 85 AAROM    General Comments        Pertinent Vitals/Pain Pain Assessment: Faces Faces Pain Scale: Hurts little more Pain Location: R knee Pain Intervention(s): Premedicated before session;Monitored during session;Limited activity within patient's tolerance    Home Living                      Prior Function            PT Goals (current goals can now be found in the care plan section) Acute Rehab PT Goals Patient Stated Goal: go home Progress towards PT goals: Progressing toward goals    Frequency  Min 6X/week    PT Plan Current plan remains appropriate    Co-evaluation             End of Session   Activity Tolerance: Patient tolerated treatment well Patient left: in chair;with family/visitor present;with call bell/phone within reach     Time: 1050-1111 PT Time Calculation (min) (ACUTE ONLY): 21 min  Charges:  $Therapeutic Exercise: 8-22 mins  G Codes:      Ivar DrapeStout, Shaliyah Taite E 04/08/2014, 12:10 PM   Samul Dadauth Austan Nicholl, PT MS Acute Rehab Dept. Number: [phone]

## 2014-04-08 NOTE — Progress Notes (Signed)
Subjective: Patient is on medicine service for pneumonia. She is also 7 days s/p right tkr. She has minimal pain in her right knee. She denies any calf pain. She states that her knee is doing well.   Activity level:  WBAT Diet tolerance:  eatign well Voiding:  ok Patient reports pain as mild.    Objective: Vital signs in last 24 hours: Temp:  [98.2 F (36.8 C)-99.1 F (37.3 C)] 98.4 F (36.9 C) (03/28 0542) Pulse Rate:  [74-81] 74 (03/28 0542) Resp:  [16-18] 18 (03/28 0542) BP: (118-136)/(57-83) 136/74 mmHg (03/28 0542) SpO2:  [95 %-98 %] 96 % (03/28 0542)  Labs:  Recent Labs  04/06/14 0340 04/07/14 0806  HGB 10.0* 9.6*    Recent Labs  04/06/14 0340 04/07/14 0806  WBC 8.0 6.4  RBC 3.51* 3.37*  HCT 30.3* 29.8*  PLT 258 271    Recent Labs  04/06/14 0340 04/07/14 0806  NA 131* 137  K 4.0 3.7  CL 92* 101  CO2 27 26  BUN 19 9  CREATININE 0.79 0.58  GLUCOSE 198* 148*  CALCIUM 8.6 8.3*   No results for input(s): LABPT, INR in the last 72 hours.  Physical Exam:  Neurologically intact ABD soft Neurovascular intact Sensation intact distally Intact pulses distally Dorsiflexion/Plantar flexion intact Incision: dressing C/D/I and no drainage No cellulitis present Compartment soft  Assessment/Plan: Patient is 7 days s/p right TKR with pneumonia.  She is doing great from an orthopaedic standpoint in regards to her knee with no evidence of infection or DVT. She will continue WBAT and can get up with some PT. She will continue on the medicine service for treatment of pneumonia. We greatly appreciate medical management. We will continue to follow. She will follow up with Dr. Turner Danielsowan as scheduled for her regular post op visit.  Macaria Bias, Ginger OrganNDREW PAUL 04/08/2014, 8:17 AM

## 2014-04-08 NOTE — Discharge Instructions (Signed)
Follow with Primary MD Mary [name],FRANCISCO, MD in 7 days   Get CBC, CMP, 2 view Chest X ray checked  by Primary MD next visit.    Activity: Weightbearing as tolerated on right knee, use walker as before. Keep right knee postop site clean and dry at all times. As tolerated with Full fall precautions use walker/cane & assistance as needed   Disposition Home     Diet: Heart Healthy low carbohydrate.  For Heart failure patients - Check your Weight same time everyday, if you gain over 2 pounds, or you develop in leg swelling, experience more shortness of breath or chest pain, call your Primary MD immediately. Follow Cardiac Low Salt Diet and 1.5 lit/day fluid restriction.   On your next visit with your primary care physician please Get Medicines reviewed and adjusted.   Please request your Prim.MD to go over all Hospital Tests and Procedure/Radiological results at the follow up, please get all Hospital records sent to your Prim MD by signing hospital release before you go home.   If you experience worsening of your admission symptoms, develop shortness of breath, life threatening emergency, suicidal or homicidal thoughts you must seek medical attention immediately by calling 911 or calling your MD immediately  if symptoms less severe.  You Must read complete instructions/literature along with all the possible adverse reactions/side effects for all the Medicines you take and that have been prescribed to you. Take any new Medicines after you have completely understood and accpet all the possible adverse reactions/side effects.   Do not drive, operating heavy machinery, perform activities at heights, swimming or participation in water activities or provide baby sitting services if your were admitted for syncope or siezures until you have seen by Primary MD or a Neurologist and advised to do so again.  Do not drive when taking Pain medications.    Do not take more than prescribed Pain, Sleep and  Anxiety Medications  Special Instructions: If you have smoked or chewed Tobacco  in the last 2 yrs please stop smoking, stop any regular Alcohol  and or any Recreational drug use.  Wear Seat belts while driving.   Please note  You were cared for by a hospitalist during your hospital stay. If you have any questions about your discharge medications or the care you received while you were in the hospital after you are discharged, you can call the unit and asked to speak with the hospitalist on call if the hospitalist that took care of you is not available. Once you are discharged, your primary care physician will handle any further medical issues. Please note that NO REFILLS for any discharge medications will be authorized once you are discharged, as it is imperative that you return to your primary care physician (or establish a relationship with a primary care physician if you do not have one) for your aftercare needs so that they can reassess your need for medications and monitor your lab values.

## 2014-04-08 NOTE — Progress Notes (Signed)
CARE MANAGEMENT NOTE 04/08/2014  Patient:  Mary [name],Mary [name]   Account Number:  [id number]  Date Initiated:  04/08/2014  Documentation initiated by:  [facility],Haiden Clucas  Subjective/Objective Assessment:   pneumonia  hx of recent knee arthroplasty     Action/Plan:   [facility]   Anticipated DC Date:  04/08/2014   Anticipated DC Plan:  HOME W HOME HEALTH SERVICES      DC Planning Services  CM consult      [facility]  Resumption Of Svcs/PTA Provider   Choice offered to / List presented to:  C-1 Patient        HH arranged  HH-2 PT      [name]  [facility]   Status of service:  Completed, signed off Medicare Important Message given?  NA - LOS <3 / Initial given by admissions (If response is "NO", the following Medicare IM given date fields will be blank) Date Medicare IM given:   Medicare IM given by:   Date Additional Medicare IM given:   Additional Medicare IM given by:    Discharge Disposition:  HOME W HOME HEALTH SERVICES  Per UR Regulation:  Reviewed for med. necessity/level of care/duration of stay  If discussed at Long Length of Stay Meetings, dates discussed:    Comments:  04/08/14 Spoke with patient, she stated she is agreeable to continuing therapy at home. Contacted Toniann [facility]ailWendy [facility] and resumed HHPT.Faxed order and d/c summary and received confirmation.

## 2014-04-08 NOTE — Progress Notes (Signed)
Patient discharge teaching given, including activity, diet, follow-up appoints, and medications. Patient verbalized understanding of all discharge instructions. IV access was d/c'd. Vitals are stable. Skin is intact except as charted in most recent assessments. Pt to be escorted out by NT, to be driven home by family. 

## 2014-04-09 DIAGNOSIS — E119 Type 2 diabetes mellitus without complications: Secondary | ICD-10-CM | POA: Diagnosis not present

## 2014-04-09 DIAGNOSIS — R262 Difficulty in walking, not elsewhere classified: Secondary | ICD-10-CM | POA: Diagnosis not present

## 2014-04-09 DIAGNOSIS — M6281 Muscle weakness (generalized): Secondary | ICD-10-CM | POA: Diagnosis not present

## 2014-04-09 DIAGNOSIS — I10 Essential (primary) hypertension: Secondary | ICD-10-CM | POA: Diagnosis not present

## 2014-04-09 DIAGNOSIS — M150 Primary generalized (osteo)arthritis: Secondary | ICD-10-CM | POA: Diagnosis not present

## 2014-04-10 DIAGNOSIS — M150 Primary generalized (osteo)arthritis: Secondary | ICD-10-CM | POA: Diagnosis not present

## 2014-04-10 DIAGNOSIS — E119 Type 2 diabetes mellitus without complications: Secondary | ICD-10-CM | POA: Diagnosis not present

## 2014-04-10 DIAGNOSIS — R262 Difficulty in walking, not elsewhere classified: Secondary | ICD-10-CM | POA: Diagnosis not present

## 2014-04-10 DIAGNOSIS — M6281 Muscle weakness (generalized): Secondary | ICD-10-CM | POA: Diagnosis not present

## 2014-04-10 DIAGNOSIS — I10 Essential (primary) hypertension: Secondary | ICD-10-CM | POA: Diagnosis not present

## 2014-04-11 DIAGNOSIS — E119 Type 2 diabetes mellitus without complications: Secondary | ICD-10-CM | POA: Diagnosis not present

## 2014-04-11 DIAGNOSIS — I10 Essential (primary) hypertension: Secondary | ICD-10-CM | POA: Diagnosis not present

## 2014-04-11 DIAGNOSIS — R262 Difficulty in walking, not elsewhere classified: Secondary | ICD-10-CM | POA: Diagnosis not present

## 2014-04-11 DIAGNOSIS — M150 Primary generalized (osteo)arthritis: Secondary | ICD-10-CM | POA: Diagnosis not present

## 2014-04-11 DIAGNOSIS — M6281 Muscle weakness (generalized): Secondary | ICD-10-CM | POA: Diagnosis not present

## 2014-04-12 DIAGNOSIS — E119 Type 2 diabetes mellitus without complications: Secondary | ICD-10-CM | POA: Diagnosis not present

## 2014-04-12 DIAGNOSIS — M150 Primary generalized (osteo)arthritis: Secondary | ICD-10-CM | POA: Diagnosis not present

## 2014-04-12 DIAGNOSIS — I10 Essential (primary) hypertension: Secondary | ICD-10-CM | POA: Diagnosis not present

## 2014-04-12 DIAGNOSIS — R262 Difficulty in walking, not elsewhere classified: Secondary | ICD-10-CM | POA: Diagnosis not present

## 2014-04-12 DIAGNOSIS — M6281 Muscle weakness (generalized): Secondary | ICD-10-CM | POA: Diagnosis not present

## 2014-04-12 LAB — CULTURE, BLOOD (ROUTINE X 2)
CULTURE: NO GROWTH
Culture: NO GROWTH

## 2014-04-13 DIAGNOSIS — E119 Type 2 diabetes mellitus without complications: Secondary | ICD-10-CM | POA: Diagnosis not present

## 2014-04-13 DIAGNOSIS — R262 Difficulty in walking, not elsewhere classified: Secondary | ICD-10-CM | POA: Diagnosis not present

## 2014-04-13 DIAGNOSIS — M6281 Muscle weakness (generalized): Secondary | ICD-10-CM | POA: Diagnosis not present

## 2014-04-13 DIAGNOSIS — M150 Primary generalized (osteo)arthritis: Secondary | ICD-10-CM | POA: Diagnosis not present

## 2014-04-13 DIAGNOSIS — I10 Essential (primary) hypertension: Secondary | ICD-10-CM | POA: Diagnosis not present

## 2014-04-15 DIAGNOSIS — Z6827 Body mass index (BMI) 27.0-27.9, adult: Secondary | ICD-10-CM | POA: Diagnosis not present

## 2014-04-15 DIAGNOSIS — N390 Urinary tract infection, site not specified: Secondary | ICD-10-CM | POA: Diagnosis not present

## 2014-04-16 DIAGNOSIS — Z471 Aftercare following joint replacement surgery: Secondary | ICD-10-CM | POA: Diagnosis not present

## 2014-04-16 DIAGNOSIS — Z96651 Presence of right artificial knee joint: Secondary | ICD-10-CM | POA: Diagnosis not present

## 2014-04-17 DIAGNOSIS — R262 Difficulty in walking, not elsewhere classified: Secondary | ICD-10-CM | POA: Diagnosis not present

## 2014-04-17 DIAGNOSIS — I10 Essential (primary) hypertension: Secondary | ICD-10-CM | POA: Diagnosis not present

## 2014-04-17 DIAGNOSIS — M6281 Muscle weakness (generalized): Secondary | ICD-10-CM | POA: Diagnosis not present

## 2014-04-17 DIAGNOSIS — E119 Type 2 diabetes mellitus without complications: Secondary | ICD-10-CM | POA: Diagnosis not present

## 2014-04-17 DIAGNOSIS — M150 Primary generalized (osteo)arthritis: Secondary | ICD-10-CM | POA: Diagnosis not present

## 2014-05-07 DIAGNOSIS — Z96651 Presence of right artificial knee joint: Secondary | ICD-10-CM | POA: Diagnosis not present

## 2014-05-07 DIAGNOSIS — Z471 Aftercare following joint replacement surgery: Secondary | ICD-10-CM | POA: Diagnosis not present

## 2014-09-18 DIAGNOSIS — I10 Essential (primary) hypertension: Secondary | ICD-10-CM

## 2014-09-18 DIAGNOSIS — E118 Type 2 diabetes mellitus with unspecified complications: Secondary | ICD-10-CM | POA: Diagnosis not present

## 2014-09-18 DIAGNOSIS — I2510 Atherosclerotic heart disease of native coronary artery without angina pectoris: Secondary | ICD-10-CM

## 2014-09-18 DIAGNOSIS — E785 Hyperlipidemia, unspecified: Secondary | ICD-10-CM

## 2014-09-18 HISTORY — DX: Essential (primary) hypertension: I10

## 2014-09-18 HISTORY — DX: Atherosclerotic heart disease of native coronary artery without angina pectoris: I25.10

## 2014-09-18 HISTORY — DX: Hyperlipidemia, unspecified: E78.5

## 2014-09-19 DIAGNOSIS — Z6827 Body mass index (BMI) 27.0-27.9, adult: Secondary | ICD-10-CM | POA: Diagnosis not present

## 2014-09-19 DIAGNOSIS — G8929 Other chronic pain: Secondary | ICD-10-CM | POA: Diagnosis not present

## 2014-09-19 DIAGNOSIS — K219 Gastro-esophageal reflux disease without esophagitis: Secondary | ICD-10-CM | POA: Diagnosis not present

## 2014-09-19 DIAGNOSIS — M25569 Pain in unspecified knee: Secondary | ICD-10-CM | POA: Diagnosis not present

## 2014-09-24 DIAGNOSIS — E118 Type 2 diabetes mellitus with unspecified complications: Secondary | ICD-10-CM | POA: Diagnosis not present

## 2014-09-24 DIAGNOSIS — I209 Angina pectoris, unspecified: Secondary | ICD-10-CM | POA: Diagnosis not present

## 2014-09-24 DIAGNOSIS — Z951 Presence of aortocoronary bypass graft: Secondary | ICD-10-CM

## 2014-09-24 DIAGNOSIS — I2510 Atherosclerotic heart disease of native coronary artery without angina pectoris: Secondary | ICD-10-CM | POA: Diagnosis not present

## 2014-09-24 DIAGNOSIS — E785 Hyperlipidemia, unspecified: Secondary | ICD-10-CM | POA: Diagnosis not present

## 2014-09-24 HISTORY — DX: Presence of aortocoronary bypass graft: Z95.1

## 2014-09-27 DIAGNOSIS — I6523 Occlusion and stenosis of bilateral carotid arteries: Secondary | ICD-10-CM | POA: Diagnosis not present

## 2014-09-27 DIAGNOSIS — I10 Essential (primary) hypertension: Secondary | ICD-10-CM | POA: Diagnosis not present

## 2014-09-27 DIAGNOSIS — D62 Acute posthemorrhagic anemia: Secondary | ICD-10-CM | POA: Diagnosis not present

## 2014-09-27 DIAGNOSIS — I499 Cardiac arrhythmia, unspecified: Secondary | ICD-10-CM | POA: Diagnosis not present

## 2014-09-27 DIAGNOSIS — J811 Chronic pulmonary edema: Secondary | ICD-10-CM | POA: Diagnosis not present

## 2014-09-27 DIAGNOSIS — E119 Type 2 diabetes mellitus without complications: Secondary | ICD-10-CM | POA: Diagnosis not present

## 2014-09-27 DIAGNOSIS — R05 Cough: Secondary | ICD-10-CM | POA: Diagnosis not present

## 2014-09-27 DIAGNOSIS — J9811 Atelectasis: Secondary | ICD-10-CM | POA: Diagnosis not present

## 2014-09-27 DIAGNOSIS — I2510 Atherosclerotic heart disease of native coronary artery without angina pectoris: Secondary | ICD-10-CM | POA: Diagnosis not present

## 2014-09-27 DIAGNOSIS — I088 Other rheumatic multiple valve diseases: Secondary | ICD-10-CM | POA: Diagnosis present

## 2014-09-27 DIAGNOSIS — I70209 Unspecified atherosclerosis of native arteries of extremities, unspecified extremity: Secondary | ICD-10-CM | POA: Diagnosis not present

## 2014-09-27 DIAGNOSIS — Z951 Presence of aortocoronary bypass graft: Secondary | ICD-10-CM | POA: Diagnosis not present

## 2014-09-27 DIAGNOSIS — I209 Angina pectoris, unspecified: Secondary | ICD-10-CM | POA: Diagnosis not present

## 2014-09-27 DIAGNOSIS — Z87891 Personal history of nicotine dependence: Secondary | ICD-10-CM | POA: Diagnosis not present

## 2014-09-27 DIAGNOSIS — I517 Cardiomegaly: Secondary | ICD-10-CM | POA: Diagnosis not present

## 2014-09-27 DIAGNOSIS — I25118 Atherosclerotic heart disease of native coronary artery with other forms of angina pectoris: Secondary | ICD-10-CM | POA: Diagnosis not present

## 2014-09-27 DIAGNOSIS — I25119 Atherosclerotic heart disease of native coronary artery with unspecified angina pectoris: Secondary | ICD-10-CM | POA: Diagnosis not present

## 2014-09-27 DIAGNOSIS — I493 Ventricular premature depolarization: Secondary | ICD-10-CM | POA: Diagnosis present

## 2014-09-27 DIAGNOSIS — J90 Pleural effusion, not elsewhere classified: Secondary | ICD-10-CM | POA: Diagnosis not present

## 2014-09-27 DIAGNOSIS — Z01818 Encounter for other preprocedural examination: Secondary | ICD-10-CM | POA: Diagnosis not present

## 2014-09-27 DIAGNOSIS — R079 Chest pain, unspecified: Secondary | ICD-10-CM | POA: Diagnosis not present

## 2014-09-27 DIAGNOSIS — I25708 Atherosclerosis of coronary artery bypass graft(s), unspecified, with other forms of angina pectoris: Secondary | ICD-10-CM | POA: Diagnosis not present

## 2014-09-27 DIAGNOSIS — E785 Hyperlipidemia, unspecified: Secondary | ICD-10-CM | POA: Diagnosis not present

## 2014-09-27 DIAGNOSIS — I25110 Atherosclerotic heart disease of native coronary artery with unstable angina pectoris: Secondary | ICD-10-CM | POA: Diagnosis not present

## 2014-09-27 DIAGNOSIS — E118 Type 2 diabetes mellitus with unspecified complications: Secondary | ICD-10-CM | POA: Diagnosis present

## 2014-09-27 DIAGNOSIS — Z01810 Encounter for preprocedural cardiovascular examination: Secondary | ICD-10-CM | POA: Diagnosis not present

## 2014-10-09 DIAGNOSIS — E118 Type 2 diabetes mellitus with unspecified complications: Secondary | ICD-10-CM | POA: Diagnosis not present

## 2014-10-09 DIAGNOSIS — I2510 Atherosclerotic heart disease of native coronary artery without angina pectoris: Secondary | ICD-10-CM | POA: Diagnosis not present

## 2014-10-09 DIAGNOSIS — I209 Angina pectoris, unspecified: Secondary | ICD-10-CM | POA: Diagnosis not present

## 2014-10-09 DIAGNOSIS — I10 Essential (primary) hypertension: Secondary | ICD-10-CM | POA: Diagnosis not present

## 2014-10-22 DIAGNOSIS — Z951 Presence of aortocoronary bypass graft: Secondary | ICD-10-CM | POA: Diagnosis not present

## 2014-10-22 DIAGNOSIS — R05 Cough: Secondary | ICD-10-CM | POA: Diagnosis not present

## 2014-10-22 DIAGNOSIS — Z6827 Body mass index (BMI) 27.0-27.9, adult: Secondary | ICD-10-CM | POA: Diagnosis not present

## 2014-10-29 DIAGNOSIS — L0390 Cellulitis, unspecified: Secondary | ICD-10-CM | POA: Diagnosis not present

## 2014-10-29 DIAGNOSIS — L0291 Cutaneous abscess, unspecified: Secondary | ICD-10-CM | POA: Diagnosis not present

## 2014-10-29 DIAGNOSIS — E1159 Type 2 diabetes mellitus with other circulatory complications: Secondary | ICD-10-CM | POA: Diagnosis not present

## 2014-10-29 DIAGNOSIS — Z6827 Body mass index (BMI) 27.0-27.9, adult: Secondary | ICD-10-CM | POA: Diagnosis not present

## 2014-11-07 DIAGNOSIS — R062 Wheezing: Secondary | ICD-10-CM | POA: Diagnosis not present

## 2014-11-07 DIAGNOSIS — Z6827 Body mass index (BMI) 27.0-27.9, adult: Secondary | ICD-10-CM | POA: Diagnosis not present

## 2014-11-07 DIAGNOSIS — L0291 Cutaneous abscess, unspecified: Secondary | ICD-10-CM | POA: Diagnosis not present

## 2014-11-07 DIAGNOSIS — L0390 Cellulitis, unspecified: Secondary | ICD-10-CM | POA: Diagnosis not present

## 2014-11-20 DIAGNOSIS — E088 Diabetes mellitus due to underlying condition with unspecified complications: Secondary | ICD-10-CM | POA: Diagnosis not present

## 2014-11-20 DIAGNOSIS — E785 Hyperlipidemia, unspecified: Secondary | ICD-10-CM | POA: Diagnosis not present

## 2014-11-20 DIAGNOSIS — I2584 Coronary atherosclerosis due to calcified coronary lesion: Secondary | ICD-10-CM | POA: Diagnosis not present

## 2014-11-20 DIAGNOSIS — I2510 Atherosclerotic heart disease of native coronary artery without angina pectoris: Secondary | ICD-10-CM | POA: Diagnosis not present

## 2014-11-20 DIAGNOSIS — I10 Essential (primary) hypertension: Secondary | ICD-10-CM | POA: Diagnosis not present

## 2014-12-02 DIAGNOSIS — E785 Hyperlipidemia, unspecified: Secondary | ICD-10-CM | POA: Diagnosis not present

## 2014-12-02 DIAGNOSIS — Z23 Encounter for immunization: Secondary | ICD-10-CM | POA: Diagnosis not present

## 2014-12-02 DIAGNOSIS — E1159 Type 2 diabetes mellitus with other circulatory complications: Secondary | ICD-10-CM | POA: Diagnosis not present

## 2014-12-02 DIAGNOSIS — I10 Essential (primary) hypertension: Secondary | ICD-10-CM | POA: Diagnosis not present

## 2015-02-17 DIAGNOSIS — M1712 Unilateral primary osteoarthritis, left knee: Secondary | ICD-10-CM | POA: Diagnosis not present

## 2015-02-17 DIAGNOSIS — Z6827 Body mass index (BMI) 27.0-27.9, adult: Secondary | ICD-10-CM | POA: Diagnosis not present

## 2015-02-17 DIAGNOSIS — H5711 Ocular pain, right eye: Secondary | ICD-10-CM | POA: Diagnosis not present

## 2015-02-25 DIAGNOSIS — Z139 Encounter for screening, unspecified: Secondary | ICD-10-CM | POA: Diagnosis not present

## 2015-02-25 DIAGNOSIS — Z0000 Encounter for general adult medical examination without abnormal findings: Secondary | ICD-10-CM | POA: Diagnosis not present

## 2015-02-25 DIAGNOSIS — Z1389 Encounter for screening for other disorder: Secondary | ICD-10-CM | POA: Diagnosis not present

## 2015-02-28 DIAGNOSIS — Z961 Presence of intraocular lens: Secondary | ICD-10-CM | POA: Diagnosis not present

## 2015-03-03 DIAGNOSIS — K529 Noninfective gastroenteritis and colitis, unspecified: Secondary | ICD-10-CM | POA: Diagnosis not present

## 2015-03-03 DIAGNOSIS — Z6827 Body mass index (BMI) 27.0-27.9, adult: Secondary | ICD-10-CM | POA: Diagnosis not present

## 2015-03-04 DIAGNOSIS — Z1231 Encounter for screening mammogram for malignant neoplasm of breast: Secondary | ICD-10-CM | POA: Diagnosis not present

## 2015-03-13 DIAGNOSIS — I2510 Atherosclerotic heart disease of native coronary artery without angina pectoris: Secondary | ICD-10-CM | POA: Diagnosis not present

## 2015-03-13 DIAGNOSIS — Z01818 Encounter for other preprocedural examination: Secondary | ICD-10-CM | POA: Diagnosis not present

## 2015-03-13 DIAGNOSIS — Z951 Presence of aortocoronary bypass graft: Secondary | ICD-10-CM | POA: Diagnosis not present

## 2015-03-13 DIAGNOSIS — E118 Type 2 diabetes mellitus with unspecified complications: Secondary | ICD-10-CM | POA: Diagnosis not present

## 2015-03-13 DIAGNOSIS — E785 Hyperlipidemia, unspecified: Secondary | ICD-10-CM | POA: Diagnosis not present

## 2015-03-13 DIAGNOSIS — I10 Essential (primary) hypertension: Secondary | ICD-10-CM | POA: Diagnosis not present

## 2015-03-18 DIAGNOSIS — I2510 Atherosclerotic heart disease of native coronary artery without angina pectoris: Secondary | ICD-10-CM | POA: Diagnosis not present

## 2015-03-18 DIAGNOSIS — I10 Essential (primary) hypertension: Secondary | ICD-10-CM | POA: Diagnosis not present

## 2015-03-18 DIAGNOSIS — E785 Hyperlipidemia, unspecified: Secondary | ICD-10-CM | POA: Diagnosis not present

## 2015-03-18 DIAGNOSIS — E118 Type 2 diabetes mellitus with unspecified complications: Secondary | ICD-10-CM | POA: Diagnosis not present

## 2015-03-18 DIAGNOSIS — Z01818 Encounter for other preprocedural examination: Secondary | ICD-10-CM | POA: Diagnosis not present

## 2015-03-18 DIAGNOSIS — Z951 Presence of aortocoronary bypass graft: Secondary | ICD-10-CM | POA: Diagnosis not present

## 2015-03-20 DIAGNOSIS — Z96651 Presence of right artificial knee joint: Secondary | ICD-10-CM | POA: Diagnosis not present

## 2015-03-20 DIAGNOSIS — M1712 Unilateral primary osteoarthritis, left knee: Secondary | ICD-10-CM | POA: Diagnosis not present

## 2015-04-03 DIAGNOSIS — I10 Essential (primary) hypertension: Secondary | ICD-10-CM | POA: Diagnosis not present

## 2015-04-03 DIAGNOSIS — E785 Hyperlipidemia, unspecified: Secondary | ICD-10-CM | POA: Diagnosis not present

## 2015-04-03 DIAGNOSIS — E1159 Type 2 diabetes mellitus with other circulatory complications: Secondary | ICD-10-CM | POA: Diagnosis not present

## 2015-04-15 DIAGNOSIS — E1159 Type 2 diabetes mellitus with other circulatory complications: Secondary | ICD-10-CM | POA: Diagnosis not present

## 2015-04-15 DIAGNOSIS — L209 Atopic dermatitis, unspecified: Secondary | ICD-10-CM | POA: Diagnosis not present

## 2015-04-15 DIAGNOSIS — I10 Essential (primary) hypertension: Secondary | ICD-10-CM | POA: Diagnosis not present

## 2015-04-15 DIAGNOSIS — E785 Hyperlipidemia, unspecified: Secondary | ICD-10-CM | POA: Diagnosis not present

## 2015-04-22 ENCOUNTER — Other Ambulatory Visit: Payer: Self-pay | Admitting: Orthopedic Surgery

## 2015-05-16 ENCOUNTER — Inpatient Hospital Stay (HOSPITAL_COMMUNITY)
Admission: RE | Admit: 2015-05-16 | Discharge: 2015-05-16 | Disposition: A | Discharge status: Home / Self Care | Payer: Medicare Other | Source: Ambulatory Visit

## 2015-05-16 ENCOUNTER — Other Ambulatory Visit (HOSPITAL_COMMUNITY): Payer: Self-pay | Admitting: *Deleted

## 2015-05-16 NOTE — Pre-Procedure Instructions (Deleted)
Mary [name]  05/16/2015      [facility] [address], [place] - [address] [place] [address] Phone: [phone] Fax: [phone]    Your procedure is scheduled on 05-28-2015   Wednesday   Report to [facility] Admitting at 11:30 A.M.   Call this number if you have problems the morning of surgery:  [phone]   How to Manage Your Diabetes Before and After Surgery  Why is it important to control my blood sugar before and after surgery? . Improving blood sugar levels before and after surgery helps healing and can limit problems. . A way of improving blood sugar control is eating a healthy diet by: o  Eating less sugar and carbohydrates o  Increasing activity/exercise o  Talking with your doctor about reaching your blood sugar goals . High blood sugars (greater than 180 mg/dL) can raise your risk of infections and slow your recovery, so you will need to focus on controlling your diabetes during the weeks before surgery. . Make sure that the doctor who takes care of your diabetes knows about your planned surgery including the date and location.  How do I manage my blood sugar before surgery? . Check your blood sugar at least 4 times a day, starting 2 days before surgery, to make sure that the level is not too high or low. o Check your blood sugar the morning of your surgery when you wake up and every 2 hours until you get to the Short Stay unit. . If your blood sugar is less than 70 mg/dL, you will need to treat for low blood sugar: o Do not take insulin. o Treat a low blood sugar (less than 70 mg/dL) with  cup of clear juice (cranberry or apple), 4 glucose tablets, OR glucose gel. o Recheck blood sugar in 15 minutes after treatment (to make sure it is greater than 70 mg/dL). If your blood sugar is not greater than 70 mg/dL on recheck, call [phone] for further instructions. . Report your blood sugar to the short stay nurse  when you get to Short Stay.  . If you are admitted to the hospital after surgery: o Your blood sugar will be checked by the staff and you will probably be given insulin after surgery (instead of oral diabetes medicines) to make sure you have good blood sugar levels. o The goal for blood sugar control after surgery is 80-180 mg/dL.              WHAT DO I DO ABOUT MY DIABETES MEDICATION?   Marland Kitchen Do not take oral diabetes medicines (pills) the morning of surgery.                   Reviewed and Endorsed by [facility] Patient Education Committee, August 2015   Remember:  Do not eat food or drink liquids after midnight.    Take these medicines the morning of surgery with A SIP OF WATER Amlodipine-benazepril(Lotrel),Fenofibrate(Tricor),pain medication as needed.                Do not wear jewelry, make-up or nail polish.  Do not wear lotions, powders, or perfumes.  You may not wear deodorant.  Do not shave 48 hours prior to surgery.   .  Do not bring valuables to the hospital.  [facility] is not responsible for any belongings or valuables.  Contacts, dentures or bridgework may not be worn into surgery.  Leave your suitcase in the car.  After surgery it may be brought to your room.  For patients admitted to the hospital, discharge time will be determined by your treatment team.  Patients discharged the day of surgery will not be allowed to drive home.   Special instructions:  See attached Sheet for instructions on [employer]   Please read over the following fact sheets that you were given. Pain Booklet, Coughing and Deep Breathing, Blood Transfusion Information and Surgical Site Infection Prevention

## 2015-05-27 ENCOUNTER — Encounter (HOSPITAL_COMMUNITY): Payer: Self-pay | Admitting: Vascular Surgery

## 2015-05-27 NOTE — Progress Notes (Signed)
Scheduler for PAT called patient and left messages for patient to call us back- last week and 05/26/15.  Scheduler had a spanish interpreter call and leave a message for patient, last week 5/10 or 5/11 with no answer or return call.  I called Dr Wadie Lessenowan's office and left a message for Raylene MiyamotoKathy Bloom that patient did not make her PAT appointment.

## 2015-05-27 NOTE — Progress Notes (Signed)
Anesthesia Chart Review: Patient is a 75 year old female currently on the OR schedule for left TKA with Dr. Turner Danielsowan on 05/28/15. Multiple attempts have been made by our schedulers (including with a Spanish interpretor) to get her in for a PAT visit. Patient has not returned calls. Dr. Wadie Lessenowan's office is aware and is attempting to contact patient and/or her family to determine if patient wants to proceed with surgery. (Update: Surgeon has cancelled the procedure for 05/28/15.)  History includes non-smoker, CAD s/p CABG X 3 in 1996 with occluded grafts 09/27/14 s/p redo CABG X 1 (SVG to LAD; RCA and OM too small and diseased for grafting) [facility] Arv[facility] MarketMills [facility] 10/02/14, HLD, DM2, arthritis, back surgery, glaucoma surgery, right TKA 04/01/14.   PCP is Dr. Charlott RakesFrancisco [place] in [place].   Cardiologist is Dr. Minette Brineaj Revenkar, last visit 03/13/15.           09/27/14 [facility] ([facility]REDO CABG; [facility] Conclusions Diagnostic Procedure Summary Multivessel CAD. Severe stenosis of the LM, LAD, Diagonal Branch, Circumflex, RCA All SVG''s are occluded. LIMA not seen well, not used? Normal overall LV systolic function. Diagnostic Procedure Recommendations CT surgery consultation for Possible CABG . Very difficult anatomy due to tortuosity, leg approach would be better for future procedures  09/27/14 Echo [facility]; Care Everywhere): Mild LVH. Normal LV size and systolic function with no appreciable segmental abnormality. EF estimated at 60-65%. Trace TR/MR. Mild dilation of the ascending aorta and arch, 4.0 cm.  09/27/14 Carotid U/S [facility]; Care Everywhere): Summary: 1. Mild carotid atherosclerosis with 1-39% stenosis bilaterally. 2. Normal antegrade vertebral flow bilaterally. 3. Normal multiphasic subclavian flow bilaterally.  Surgery is now off the OR schedule, so further review if she is rescheduled in the future.  Velna Ochsllison Lael Pilch, PA-C [facility] Short Stay  Center/Anesthesiology Phone [phone] 05/27/2015 1:30 PM

## 2015-05-28 ENCOUNTER — Inpatient Hospital Stay (HOSPITAL_COMMUNITY): Admission: RE | Admit: 2015-05-28 | Payer: Medicare Other | Source: Ambulatory Visit | Admitting: Orthopedic Surgery

## 2015-05-28 ENCOUNTER — Encounter (HOSPITAL_COMMUNITY): Admission: RE | Payer: Self-pay | Source: Ambulatory Visit

## 2015-05-28 SURGERY — ARTHROPLASTY, KNEE, TOTAL
Anesthesia: Spinal | Laterality: Left

## 2015-05-30 ENCOUNTER — Encounter (HOSPITAL_COMMUNITY): Payer: Self-pay | Admitting: General Practice

## 2015-05-30 NOTE — Progress Notes (Signed)
Spoke with pt son, Linna DarnerLuis Cordero, to make him aware of new arrival time of 7:30 A.M. son stated that pt would arrive at 7:00 A.M.

## 2015-05-30 NOTE — H&P (Signed)
TOTAL KNEE ADMISSION H&P  Patient is being admitted for left total knee arthroplasty.  Subjective:  Chief Complaint:left knee pain.  HPI: Mary [name], 75 y.o. female, has a history of pain and functional disability in the left knee due to arthritis and has failed non-surgical conservative treatments for greater than 12 weeks to includeNSAID's and/or analgesics, corticosteriod injections, viscosupplementation injections, flexibility and strengthening excercises, use of assistive devices and activity modification.  Onset of symptoms was gradual, starting 2 years ago with gradually worsening course since that time. The patient noted no past surgery on the left knee(s).  Patient currently rates pain in the left knee(s) at 10 out of 10 with activity. Patient has night pain, worsening of pain with activity and weight bearing, pain that interferes with activities of daily living, pain with passive range of motion and crepitus.  Patient has evidence of joint space narrowing by imaging studies.  There is no active infection.  Patient Active Problem List   Diagnosis Date Noted  . Hypoxia 04/06/2014  . Cellulitis 04/06/2014  . Diabetes (HCC) 04/06/2014  . S/P CABG x 3 04/06/2014  . Hyponatremia 04/06/2014  . Fever 04/06/2014  . Primary osteoarthritis of right knee 03/29/2014   Past Medical History  Diagnosis Date  . Hyperlipemia   . Diabetes mellitus without complication (HCC)     DM II  . Arthritis   . H/O transfusion   . Coronary artery disease     Past Surgical History  Procedure Laterality Date  . Coronary artery bypass graft  11/10/1994    x 3  . Back surgery  1992  . Tubal ligation    . Carpal tunnel release Left   . Cardiac catheterization    . Colonoscopy    . Eye surgery Bilateral     Glaucoma  . Total knee arthroplasty Right 04/01/2014    Procedure: RIGHT TOTAL KNEE ARTHROPLASTY;  Surgeon: Gean Birchwood, MD;  Location: [facility] OR;  Service: Orthopedics;  Laterality: Right;    No  prescriptions prior to admission   No Known Allergies  Social History  Substance Use Topics  . Smoking status: Former Smoker -- 0.50 packs/day for 5 years    Quit date: 04/05/1985  . Smokeless tobacco: Not on file  . Alcohol Use: No    History reviewed. No pertinent family history.   Review of Systems  Constitutional: Negative.   HENT: Negative.   Eyes: Negative.   Respiratory: Negative.   Cardiovascular:       HTN, BLOOD CLOTS  Gastrointestinal: Negative.   Genitourinary: Negative.   Musculoskeletal: Positive for joint pain.  Skin: Negative.   Neurological: Negative.   Endo/Heme/Allergies:       Blood sugar problem  Psychiatric/Behavioral: Negative.     Objective:  Physical Exam  Constitutional: She is oriented to person, place, and time. She appears well-developed and well-nourished.  HENT:  Head: Normocephalic and atraumatic.  Eyes: Pupils are equal, round, and reactive to light.  Neck: Normal range of motion. Neck supple.  Cardiovascular: Intact distal pulses.   Respiratory: Effort normal.  Musculoskeletal: She exhibits tenderness.  the patient's right knee has good strength good range of motion and no pain.  Patient's left knee does have reduced range of motion from approximately 5 to 110.  She has tenderness over the medial joint line.  No instability.  Obvious crepitus with range of motion.  No noticeable effusion.  Her calves are soft and nontender.  She is neurovascularly intact distally.  Neurological: She  is alert and oriented to person, place, and time.  Skin: Skin is warm and dry.  Psychiatric: She has a normal mood and affect. Her behavior is normal. Thought content normal.    Vital signs in last 24 hours:    Labs:   Estimated body mass index is 28.09 kg/(m^2) as calculated from the following:   Height as of 04/06/14: 5\' 6"  (1.676 m).   Weight as of 04/06/14: 78.9 kg (173 lb 15.1 oz).   Imaging Review Plain radiographs demonstrate bilateral AP  weightbearing, lateral sunrise views of the left knee are taken and reviewed in office today.  Patient does have end-stage arthritis near bone-on-bone of the medial compartment left knee  Assessment/Plan:  End stage arthritis, left knee   The patient history, physical examination, clinical judgment of the provider and imaging studies are consistent with end stage degenerative joint disease of the left knee(s) and total knee arthroplasty is deemed medically necessary. The treatment options including medical management, injection therapy arthroscopy and arthroplasty were discussed at length. The risks and benefits of total knee arthroplasty were presented and reviewed. The risks due to aseptic loosening, infection, stiffness, patella tracking problems, thromboembolic complications and other imponderables were discussed. The patient acknowledged the explanation, agreed to proceed with the plan and consent was signed. Patient is being admitted for inpatient treatment for surgery, pain control, PT, OT, prophylactic antibiotics, VTE prophylaxis, progressive ambulation and ADL's and discharge planning. The patient is planning to be discharged home with home health services

## 2015-05-30 NOTE — Progress Notes (Signed)
Anesthesia Follow-up: SAME DAY WORK-UP.  See my note from 05/27/15. Patient was initially scheduled for left TKA by Dr. Turner Danielsowan on 05/28/15, but procedure was canceled when patient could not be reached after multiple attempts. (Later found that we and Dr. Wadie Lessenowan's office only had one of her son's phone numbers who was now in [place]. Dr. Turner Danielsowan was only able to get in touch with her once another son called to follow-up.)  History, last cath, last echo, and last carotid duplex results outlined in my 05/27/15 note. Med list does not appear updated, but I did call and speak with patient's son Lissa HoardLouis who confirmed that patient last took Plavix on 05/29/15 and that her last dose of ASA was no later than 05/29/15.   Patient with history of redo CABG 09/27/14 (SVG to LAD; RCA and OM too small and diseased for grafting). Cardiologist Dr. Tomie Chinaevankar last saw patient on 03/13/15. Of note he signed a clearance letter dated 04/06/12 (but computer stamped 04/07/15) indicating that "In view of recent negative stress test patient is not at high risk of coronary events in aforementioned surgery. Meticulous hemodynamic monitoring will further reduce the risk of coronary events." I called Dr. Loretha Staplerevenakar's office to clarify timing of "recent" stress test since I didn't see one in Epic. Nursing staff did clarify that patient had not had a stress test since her redo CABG, but RN Wynona Caneshristine did clarify with Dr. Tomie Chinaevankar that in fact, patient was still okay for surgery. She also spoke with patient who denied CV symptoms. Reviewed with anesthesiologist Dr. Maple HudsonMoser. Further evaluation by her anesthesiologist on the day of surgery to ensure no acute changes and that labs are acceptable.   She is for labs on arrival.   I spoke with Olegario MessierKathy at Dr. Wadie Lessenowan's office and anesthesiologist Dr. Maple HudsonMoser about patient only being off Plavix for 4 days. She will not be a candidate for spinal anesthesia. Reportedly, Dr. Turner Danielsowan is okay if GA is planned. Further evaluation  on arrival.  Velna Ochsllison Ranyia Witting, PA-C [facility] Short Stay Center/Anesthesiology Phone [phone] 05/30/2015 4:00 PM

## 2015-05-30 NOTE — Progress Notes (Addendum)
Patient unable to speak English, pre-op instructions and history reviewed with son, Mary [name].  Patient denies chest pain and shortness of breath.  PCP - Dr. Yetta FlockHodges and cardiologist - Dr. Josiah Loboevenkar.  EKG - DOS CXR - DOS Echo - 03/2014 Stress test - 2016 Cardiac cath - 2016  Patient's son Mary Darner(Luis [name]) informs nurse that patient checks her blood sugar once a day and the her fasting is in the 90's.  Patient instructed to check blood sugar 4 times a day, 2 days prior to surgery and to check blood sugar every 2 hours the day of surgery.  Patient's son also informed that is blood sugar is less than 70 the DOS, that patient is to drink 1/2 cup of apple or cranberry juice and then recheck in 15 minutes  If blood sugar remains less than 70, she is to call short stay.  Patient's son verbalized understanding.    Nurse instructed patient's son that the only medication that she needed to take the DOS is oxycodone-acetaminophen (if needed); no diabetes medications the morning of surgery.

## 2015-06-01 MED ORDER — CEFAZOLIN SODIUM-DEXTROSE 2-4 GM/100ML-% IV SOLN
2.0000 g | INTRAVENOUS | Status: AC
  Administered 2015-06-02: 2 g via INTRAVENOUS
  Filled 2015-06-01: qty 100

## 2015-06-02 ENCOUNTER — Inpatient Hospital Stay (HOSPITAL_COMMUNITY)
Admission: RE | Admit: 2015-06-02 | Discharge: 2015-06-04 | DRG: 470 | Disposition: A | Discharge status: Home Health | Payer: Medicare Other | Source: Ambulatory Visit | Attending: Orthopedic Surgery | Admitting: Orthopedic Surgery

## 2015-06-02 ENCOUNTER — Inpatient Hospital Stay (HOSPITAL_COMMUNITY): Payer: Medicare Other

## 2015-06-02 ENCOUNTER — Inpatient Hospital Stay (HOSPITAL_COMMUNITY): Payer: Medicare Other | Admitting: Vascular Surgery

## 2015-06-02 ENCOUNTER — Encounter (HOSPITAL_COMMUNITY)
Admission: RE | Disposition: A | Discharge status: Home Health | Payer: Self-pay | Source: Ambulatory Visit | Attending: Orthopedic Surgery

## 2015-06-02 DIAGNOSIS — E785 Hyperlipidemia, unspecified: Secondary | ICD-10-CM | POA: Diagnosis present

## 2015-06-02 DIAGNOSIS — I2510 Atherosclerotic heart disease of native coronary artery without angina pectoris: Secondary | ICD-10-CM | POA: Diagnosis present

## 2015-06-02 DIAGNOSIS — D62 Acute posthemorrhagic anemia: Secondary | ICD-10-CM | POA: Diagnosis not present

## 2015-06-02 DIAGNOSIS — Z96651 Presence of right artificial knee joint: Secondary | ICD-10-CM | POA: Diagnosis present

## 2015-06-02 DIAGNOSIS — Z951 Presence of aortocoronary bypass graft: Secondary | ICD-10-CM | POA: Diagnosis not present

## 2015-06-02 DIAGNOSIS — M1710 Unilateral primary osteoarthritis, unspecified knee: Secondary | ICD-10-CM | POA: Diagnosis present

## 2015-06-02 DIAGNOSIS — M25562 Pain in left knee: Secondary | ICD-10-CM | POA: Diagnosis not present

## 2015-06-02 DIAGNOSIS — M1712 Unilateral primary osteoarthritis, left knee: Secondary | ICD-10-CM | POA: Diagnosis not present

## 2015-06-02 DIAGNOSIS — Z87891 Personal history of nicotine dependence: Secondary | ICD-10-CM | POA: Diagnosis not present

## 2015-06-02 DIAGNOSIS — I10 Essential (primary) hypertension: Secondary | ICD-10-CM | POA: Diagnosis present

## 2015-06-02 DIAGNOSIS — E119 Type 2 diabetes mellitus without complications: Secondary | ICD-10-CM | POA: Diagnosis present

## 2015-06-02 DIAGNOSIS — Z01818 Encounter for other preprocedural examination: Secondary | ICD-10-CM

## 2015-06-02 DIAGNOSIS — M179 Osteoarthritis of knee, unspecified: Secondary | ICD-10-CM | POA: Diagnosis not present

## 2015-06-02 DIAGNOSIS — G8918 Other acute postprocedural pain: Secondary | ICD-10-CM | POA: Diagnosis not present

## 2015-06-02 HISTORY — PX: TOTAL KNEE ARTHROPLASTY: SHX125

## 2015-06-02 HISTORY — DX: Atherosclerotic heart disease of native coronary artery without angina pectoris: I25.10

## 2015-06-02 HISTORY — DX: Unilateral primary osteoarthritis, unspecified knee: M17.10

## 2015-06-02 LAB — CBC WITH DIFFERENTIAL/PLATELET
Basophils Absolute: 0.1 10*3/uL (ref 0.0–0.1)
Basophils Relative: 1 %
EOS ABS: 0.1 10*3/uL (ref 0.0–0.7)
EOS PCT: 1 %
HCT: 39.3 % (ref 36.0–46.0)
HEMOGLOBIN: 12.4 g/dL (ref 12.0–15.0)
LYMPHS ABS: 1.8 10*3/uL (ref 0.7–4.0)
LYMPHS PCT: 25 %
MCH: 26.7 pg (ref 26.0–34.0)
MCHC: 31.6 g/dL (ref 30.0–36.0)
MCV: 84.5 fL (ref 78.0–100.0)
MONOS PCT: 5 %
Monocytes Absolute: 0.4 10*3/uL (ref 0.1–1.0)
Neutro Abs: 4.7 10*3/uL (ref 1.7–7.7)
Neutrophils Relative %: 68 %
PLATELETS: 235 10*3/uL (ref 150–400)
RBC: 4.65 MIL/uL (ref 3.87–5.11)
RDW: 12.5 % (ref 11.5–15.5)
WBC: 7 10*3/uL (ref 4.0–10.5)

## 2015-06-02 LAB — SURGICAL PCR SCREEN
MRSA, PCR: NEGATIVE
STAPHYLOCOCCUS AUREUS: NEGATIVE

## 2015-06-02 LAB — TYPE AND SCREEN
ABO/RH(D): O POS
ANTIBODY SCREEN: NEGATIVE

## 2015-06-02 LAB — BASIC METABOLIC PANEL
Anion gap: 8 (ref 5–15)
BUN: 16 mg/dL (ref 6–20)
CHLORIDE: 107 mmol/L (ref 101–111)
CO2: 25 mmol/L (ref 22–32)
CREATININE: 0.67 mg/dL (ref 0.44–1.00)
Calcium: 9.6 mg/dL (ref 8.9–10.3)
GFR calc Af Amer: >60 mL/min (ref >60)
GFR calc non Af Amer: >60 mL/min (ref >60)
Glucose, Bld: 137 mg/dL — ABNORMAL HIGH (ref 65–99)
POTASSIUM: 3.9 mmol/L (ref 3.5–5.1)
SODIUM: 140 mmol/L (ref 135–145)

## 2015-06-02 LAB — GLUCOSE, CAPILLARY
GLUCOSE-CAPILLARY: 129 mg/dL — AB (ref 65–99)
GLUCOSE-CAPILLARY: 184 mg/dL — AB (ref 65–99)
GLUCOSE-CAPILLARY: 217 mg/dL — AB (ref 65–99)
Glucose-Capillary: 133 mg/dL — ABNORMAL HIGH (ref 65–99)

## 2015-06-02 LAB — APTT: APTT: 29 s (ref 24–37)

## 2015-06-02 LAB — PROTIME-INR
INR: 1.2 (ref 0.00–1.49)
Prothrombin Time: 15.3 seconds — ABNORMAL HIGH (ref 11.6–15.2)

## 2015-06-02 SURGERY — ARTHROPLASTY, KNEE, TOTAL
Anesthesia: Spinal | Site: Knee | Laterality: Left

## 2015-06-02 MED ORDER — MENTHOL 3 MG MT LOZG: 1.0000 | LOZENGE | OROMUCOSAL | Status: DC | PRN

## 2015-06-02 MED ORDER — SODIUM CHLORIDE 0.9 % IR SOLN
Status: DC | PRN
  Administered 2015-06-02: 3000 mL

## 2015-06-02 MED ORDER — DOCUSATE SODIUM 100 MG PO CAPS
100.0000 mg | ORAL_CAPSULE | Freq: Two times a day (BID) | ORAL | Status: DC
  Administered 2015-06-02 – 2015-06-04 (×5): 100 mg via ORAL
  Filled 2015-06-02 (×5): qty 1

## 2015-06-02 MED ORDER — PROPOFOL 10 MG/ML IV BOLUS
INTRAVENOUS | Status: DC | PRN
  Administered 2015-06-02: 140 mg via INTRAVENOUS

## 2015-06-02 MED ORDER — BUPIVACAINE HCL 0.5 % IJ SOLN
INTRAMUSCULAR | Status: DC | PRN
  Administered 2015-06-02: 20 mL

## 2015-06-02 MED ORDER — SENNOSIDES-DOCUSATE SODIUM 8.6-50 MG PO TABS: 1.0000 | ORAL_TABLET | Freq: Every evening | ORAL | Status: DC | PRN

## 2015-06-02 MED ORDER — PHENYLEPHRINE HCL 10 MG/ML IJ SOLN
10.0000 mg | INTRAVENOUS | Status: DC | PRN
  Administered 2015-06-02: 50 ug/min via INTRAVENOUS

## 2015-06-02 MED ORDER — ACETAMINOPHEN 325 MG PO TABS
650.0000 mg | ORAL_TABLET | Freq: Four times a day (QID) | ORAL | Status: DC | PRN
  Administered 2015-06-03: 650 mg via ORAL
  Filled 2015-06-02: qty 2

## 2015-06-02 MED ORDER — CLOPIDOGREL BISULFATE 75 MG PO TABS
75.0000 mg | ORAL_TABLET | Freq: Every day | ORAL | Status: DC
  Administered 2015-06-03 – 2015-06-04 (×2): 75 mg via ORAL
  Filled 2015-06-02 (×2): qty 1

## 2015-06-02 MED ORDER — SITAGLIP PHOS-METFORMIN HCL ER 50-1000 MG PO TB24: 1.0000 | ORAL_TABLET | Freq: Every day | ORAL | Status: DC

## 2015-06-02 MED ORDER — KCL IN DEXTROSE-NACL 20-5-0.45 MEQ/L-%-% IV SOLN
INTRAVENOUS | Status: AC
  Filled 2015-06-02: qty 1000

## 2015-06-02 MED ORDER — HYDROMORPHONE HCL 1 MG/ML IJ SOLN
0.5000 mg | INTRAMUSCULAR | Status: DC | PRN
  Administered 2015-06-03 (×3): 1 mg via INTRAVENOUS
  Filled 2015-06-02 (×3): qty 1

## 2015-06-02 MED ORDER — FENTANYL CITRATE (PF) 100 MCG/2ML IJ SOLN
INTRAMUSCULAR | Status: AC
  Administered 2015-06-02: 25 ug via INTRAVENOUS
  Filled 2015-06-02: qty 2

## 2015-06-02 MED ORDER — FENTANYL CITRATE (PF) 100 MCG/2ML IJ SOLN
25.0000 ug | INTRAMUSCULAR | Status: DC | PRN
  Administered 2015-06-02 (×2): 50 ug via INTRAVENOUS
  Administered 2015-06-02 (×2): 25 ug via INTRAVENOUS

## 2015-06-02 MED ORDER — TIZANIDINE HCL 2 MG PO TABS: 2.0000 mg | ORAL_TABLET | Freq: Four times a day (QID) | ORAL | Status: DC | PRN

## 2015-06-02 MED ORDER — DEXTROSE-NACL 5-0.45 % IV SOLN: INTRAVENOUS | Status: DC

## 2015-06-02 MED ORDER — METHOCARBAMOL 1000 MG/10ML IJ SOLN
500.0000 mg | Freq: Four times a day (QID) | INTRAVENOUS | Status: DC | PRN
  Filled 2015-06-02: qty 5

## 2015-06-02 MED ORDER — DIPHENHYDRAMINE HCL 12.5 MG/5ML PO ELIX: 12.5000 mg | ORAL_SOLUTION | ORAL | Status: DC | PRN

## 2015-06-02 MED ORDER — LACTATED RINGERS IV SOLN
INTRAVENOUS | Status: DC | PRN
  Administered 2015-06-02 (×2): via INTRAVENOUS

## 2015-06-02 MED ORDER — BENAZEPRIL HCL 20 MG PO TABS
20.0000 mg | ORAL_TABLET | Freq: Every day | ORAL | Status: DC
  Administered 2015-06-03 – 2015-06-04 (×2): 20 mg via ORAL
  Filled 2015-06-02 (×2): qty 1

## 2015-06-02 MED ORDER — ONDANSETRON HCL 4 MG/2ML IJ SOLN
INTRAMUSCULAR | Status: DC | PRN
  Administered 2015-06-02: 4 mg via INTRAVENOUS

## 2015-06-02 MED ORDER — INSULIN ASPART 100 UNIT/ML ~~LOC~~ SOLN
0.0000 [IU] | Freq: Three times a day (TID) | SUBCUTANEOUS | Status: DC
  Administered 2015-06-02 – 2015-06-03 (×2): 3 [IU] via SUBCUTANEOUS
  Administered 2015-06-03 (×2): 5 [IU] via SUBCUTANEOUS
  Administered 2015-06-04 (×2): 3 [IU] via SUBCUTANEOUS

## 2015-06-02 MED ORDER — 0.9 % SODIUM CHLORIDE (POUR BTL) OPTIME
TOPICAL | Status: DC | PRN
  Administered 2015-06-02 (×2): 1000 mL

## 2015-06-02 MED ORDER — AMLODIPINE BESYLATE 10 MG PO TABS
10.0000 mg | ORAL_TABLET | Freq: Every day | ORAL | Status: DC
  Administered 2015-06-03 – 2015-06-04 (×2): 10 mg via ORAL
  Filled 2015-06-02 (×2): qty 1

## 2015-06-02 MED ORDER — PROPOFOL 10 MG/ML IV BOLUS
INTRAVENOUS | Status: AC
  Filled 2015-06-02: qty 20

## 2015-06-02 MED ORDER — SODIUM CHLORIDE 0.9 % IJ SOLN
INTRAMUSCULAR | Status: DC | PRN
  Administered 2015-06-02 (×2): 10 mL via INTRAVENOUS

## 2015-06-02 MED ORDER — DIPHENHYDRAMINE HCL 50 MG/ML IJ SOLN
25.0000 mg | Freq: Once | INTRAMUSCULAR | Status: AC
  Administered 2015-06-02: 25 mg via INTRAVENOUS

## 2015-06-02 MED ORDER — BUPIVACAINE LIPOSOME 1.3 % IJ SUSP
20.0000 mL | Freq: Once | INTRAMUSCULAR | Status: AC
  Administered 2015-06-02: 20 mL
  Filled 2015-06-02 (×2): qty 20

## 2015-06-02 MED ORDER — FLEET ENEMA 7-19 GM/118ML RE ENEM
1.0000 | ENEMA | Freq: Once | RECTAL | Status: DC | PRN
Start: 2015-06-02 — End: 2015-06-04

## 2015-06-02 MED ORDER — MIDAZOLAM HCL 2 MG/2ML IJ SOLN
INTRAMUSCULAR | Status: AC
  Filled 2015-06-02: qty 2

## 2015-06-02 MED ORDER — MIDAZOLAM HCL 5 MG/5ML IJ SOLN
INTRAMUSCULAR | Status: DC | PRN
  Administered 2015-06-02: 1 mg via INTRAVENOUS

## 2015-06-02 MED ORDER — BISACODYL 5 MG PO TBEC: 5.0000 mg | DELAYED_RELEASE_TABLET | Freq: Every day | ORAL | Status: DC | PRN

## 2015-06-02 MED ORDER — FENTANYL CITRATE (PF) 250 MCG/5ML IJ SOLN
INTRAMUSCULAR | Status: AC
  Filled 2015-06-02: qty 5

## 2015-06-02 MED ORDER — OXYCODONE HCL 5 MG PO TABS
5.0000 mg | ORAL_TABLET | ORAL | Status: DC | PRN
  Administered 2015-06-02 – 2015-06-03 (×2): 10 mg via ORAL
  Administered 2015-06-03: 5 mg via ORAL
  Administered 2015-06-03 (×2): 10 mg via ORAL
  Administered 2015-06-03: 5 mg via ORAL
  Administered 2015-06-04: 10 mg via ORAL
  Filled 2015-06-02: qty 2
  Filled 2015-06-02: qty 1
  Filled 2015-06-02 (×2): qty 2
  Filled 2015-06-02: qty 1
  Filled 2015-06-02 (×3): qty 2

## 2015-06-02 MED ORDER — FENTANYL CITRATE (PF) 100 MCG/2ML IJ SOLN
25.0000 ug | INTRAMUSCULAR | Status: DC | PRN
  Administered 2015-06-02: 50 ug via INTRAVENOUS

## 2015-06-02 MED ORDER — LACTATED RINGERS IV SOLN
INTRAVENOUS | Status: DC
  Administered 2015-06-02: 10:00:00 via INTRAVENOUS

## 2015-06-02 MED ORDER — ASPIRIN EC 325 MG PO TBEC
325.0000 mg | DELAYED_RELEASE_TABLET | Freq: Every day | ORAL | Status: DC
  Administered 2015-06-03 – 2015-06-04 (×2): 325 mg via ORAL
  Filled 2015-06-02 (×2): qty 1

## 2015-06-02 MED ORDER — CEFUROXIME SODIUM 1.5 G IJ SOLR
INTRAMUSCULAR | Status: DC | PRN
  Administered 2015-06-02: 1.5 g

## 2015-06-02 MED ORDER — AMLODIPINE BESY-BENAZEPRIL HCL 10-20 MG PO CAPS: 1.0000 | ORAL_CAPSULE | Freq: Every day | ORAL | Status: DC

## 2015-06-02 MED ORDER — METFORMIN HCL ER 500 MG PO TB24
1000.0000 mg | ORAL_TABLET | Freq: Every day | ORAL | Status: DC
  Administered 2015-06-03 – 2015-06-04 (×2): 1000 mg via ORAL
  Filled 2015-06-02 (×2): qty 2

## 2015-06-02 MED ORDER — TRANEXAMIC ACID 1000 MG/10ML IV SOLN
2000.0000 mg | Freq: Once | INTRAVENOUS | Status: AC
  Administered 2015-06-02: 2000 mg via TOPICAL
  Filled 2015-06-02: qty 20

## 2015-06-02 MED ORDER — ROSUVASTATIN CALCIUM 10 MG PO TABS
10.0000 mg | ORAL_TABLET | Freq: Every evening | ORAL | Status: DC
  Administered 2015-06-02 – 2015-06-03 (×2): 10 mg via ORAL
  Filled 2015-06-02 (×2): qty 1

## 2015-06-02 MED ORDER — LIDOCAINE HCL (CARDIAC) 20 MG/ML IV SOLN
INTRAVENOUS | Status: DC | PRN
  Administered 2015-06-02: 100 mg via INTRAVENOUS

## 2015-06-02 MED ORDER — PHENYLEPHRINE HCL 10 MG/ML IJ SOLN
INTRAMUSCULAR | Status: DC | PRN
  Administered 2015-06-02: 40 ug via INTRAVENOUS

## 2015-06-02 MED ORDER — CEFUROXIME SODIUM 1.5 G IJ SOLR
INTRAMUSCULAR | Status: AC
  Filled 2015-06-02: qty 1.5

## 2015-06-02 MED ORDER — ASPIRIN EC 325 MG PO TBEC: 325.0000 mg | DELAYED_RELEASE_TABLET | Freq: Two times a day (BID) | ORAL | Status: DC

## 2015-06-02 MED ORDER — ACETAMINOPHEN 650 MG RE SUPP: 650.0000 mg | Freq: Four times a day (QID) | RECTAL | Status: DC | PRN

## 2015-06-02 MED ORDER — METOCLOPRAMIDE HCL 5 MG PO TABS: 5.0000 mg | ORAL_TABLET | Freq: Three times a day (TID) | ORAL | Status: DC | PRN

## 2015-06-02 MED ORDER — FENTANYL CITRATE (PF) 250 MCG/5ML IJ SOLN
INTRAMUSCULAR | Status: DC | PRN
  Administered 2015-06-02: 50 ug via INTRAVENOUS
  Administered 2015-06-02 (×2): 25 ug via INTRAVENOUS
  Administered 2015-06-02: 100 ug via INTRAVENOUS
  Administered 2015-06-02 (×2): 25 ug via INTRAVENOUS

## 2015-06-02 MED ORDER — PHENOL 1.4 % MT LIQD: 1.0000 | OROMUCOSAL | Status: DC | PRN

## 2015-06-02 MED ORDER — DIPHENHYDRAMINE HCL 50 MG/ML IJ SOLN
INTRAMUSCULAR | Status: AC
  Filled 2015-06-02: qty 1

## 2015-06-02 MED ORDER — OXYCODONE-ACETAMINOPHEN 5-325 MG PO TABS: 1.0000 | ORAL_TABLET | ORAL | Status: DC | PRN

## 2015-06-02 MED ORDER — METHOCARBAMOL 500 MG PO TABS
500.0000 mg | ORAL_TABLET | Freq: Four times a day (QID) | ORAL | Status: DC | PRN
  Administered 2015-06-02 – 2015-06-04 (×6): 500 mg via ORAL
  Filled 2015-06-02 (×6): qty 1

## 2015-06-02 MED ORDER — METOCLOPRAMIDE HCL 5 MG/ML IJ SOLN: 5.0000 mg | Freq: Three times a day (TID) | INTRAMUSCULAR | Status: DC | PRN

## 2015-06-02 MED ORDER — ALUM & MAG HYDROXIDE-SIMETH 200-200-20 MG/5ML PO SUSP: 30.0000 mL | ORAL | Status: DC | PRN

## 2015-06-02 MED ORDER — ONDANSETRON HCL 4 MG/2ML IJ SOLN: 4.0000 mg | Freq: Four times a day (QID) | INTRAMUSCULAR | Status: DC | PRN

## 2015-06-02 MED ORDER — FENOFIBRATE 160 MG PO TABS
160.0000 mg | ORAL_TABLET | Freq: Every day | ORAL | Status: DC
  Administered 2015-06-02 – 2015-06-04 (×3): 160 mg via ORAL
  Filled 2015-06-02 (×3): qty 1

## 2015-06-02 MED ORDER — BUPIVACAINE HCL (PF) 0.5 % IJ SOLN
INTRAMUSCULAR | Status: AC
  Filled 2015-06-02: qty 30

## 2015-06-02 MED ORDER — KCL IN DEXTROSE-NACL 20-5-0.45 MEQ/L-%-% IV SOLN
INTRAVENOUS | Status: DC
  Administered 2015-06-02 (×2): via INTRAVENOUS
  Administered 2015-06-03: 1000 mL via INTRAVENOUS
  Filled 2015-06-02 (×4): qty 1000

## 2015-06-02 MED ORDER — MUPIROCIN 2 % EX OINT
TOPICAL_OINTMENT | CUTANEOUS | Status: AC
  Filled 2015-06-02: qty 22

## 2015-06-02 MED ORDER — CHLORHEXIDINE GLUCONATE 4 % EX LIQD: 60.0000 mL | Freq: Once | CUTANEOUS | Status: DC

## 2015-06-02 MED ORDER — ONDANSETRON HCL 4 MG PO TABS: 4.0000 mg | ORAL_TABLET | Freq: Four times a day (QID) | ORAL | Status: DC | PRN

## 2015-06-02 MED ORDER — LINAGLIPTIN 5 MG PO TABS
5.0000 mg | ORAL_TABLET | Freq: Every day | ORAL | Status: DC
  Administered 2015-06-03 – 2015-06-04 (×2): 5 mg via ORAL
  Filled 2015-06-02 (×2): qty 1

## 2015-06-02 SURGICAL SUPPLY — 55 items
BANDAGE ACE 6X5 VEL STRL LF (GAUZE/BANDAGES/DRESSINGS) ×2 IMPLANT
BANDAGE ESMARK 6X9 LF (GAUZE/BANDAGES/DRESSINGS) ×1 IMPLANT
BLADE SAG 18X100X1.27 (BLADE) ×2 IMPLANT
BLADE SAW SGTL 13X75X1.27 (BLADE) ×2 IMPLANT
BLADE SURG ROTATE 9660 (MISCELLANEOUS) IMPLANT
BNDG ELASTIC 6X10 VLCR STRL LF (GAUZE/BANDAGES/DRESSINGS) ×2 IMPLANT
BNDG ESMARK 6X9 LF (GAUZE/BANDAGES/DRESSINGS) ×2
BOWL SMART MIX CTS (DISPOSABLE) ×2 IMPLANT
CAPT KNEE TOTAL 3 ATTUNE ×2 IMPLANT
CEMENT HV SMART SET (Cement) ×4 IMPLANT
COVER SURGICAL LIGHT HANDLE (MISCELLANEOUS) ×2 IMPLANT
CUFF TOURNIQUET SINGLE 34IN LL (TOURNIQUET CUFF) ×2 IMPLANT
CUFF TOURNIQUET SINGLE 44IN (TOURNIQUET CUFF) IMPLANT
DRAPE EXTREMITY T 121X128X90 (DRAPE) ×2 IMPLANT
DRAPE U-SHAPE 47X51 STRL (DRAPES) ×2 IMPLANT
DRESSING AQUACEL AG SP 3.5X10 (GAUZE/BANDAGES/DRESSINGS) ×1 IMPLANT
DRSG AQUACEL AG ADV 3.5X10 (GAUZE/BANDAGES/DRESSINGS) ×2 IMPLANT
DRSG AQUACEL AG SP 3.5X10 (GAUZE/BANDAGES/DRESSINGS) ×2
DURAPREP 26ML APPLICATOR (WOUND CARE) ×4 IMPLANT
ELECT REM PT RETURN 9FT ADLT (ELECTROSURGICAL) ×2
ELECTRODE REM PT RTRN 9FT ADLT (ELECTROSURGICAL) ×1 IMPLANT
EVACUATOR 1/8 PVC DRAIN (DRAIN) IMPLANT
GLOVE BIO SURGEON STRL SZ7.5 (GLOVE) ×2 IMPLANT
GLOVE BIO SURGEON STRL SZ8.5 (GLOVE) ×2 IMPLANT
GLOVE BIOGEL PI IND STRL 8 (GLOVE) ×1 IMPLANT
GLOVE BIOGEL PI IND STRL 9 (GLOVE) ×1 IMPLANT
GLOVE BIOGEL PI INDICATOR 8 (GLOVE) ×1
GLOVE BIOGEL PI INDICATOR 9 (GLOVE) ×1
GOWN STRL REUS W/ TWL LRG LVL3 (GOWN DISPOSABLE) ×1 IMPLANT
GOWN STRL REUS W/ TWL XL LVL3 (GOWN DISPOSABLE) ×2 IMPLANT
GOWN STRL REUS W/TWL LRG LVL3 (GOWN DISPOSABLE) ×1
GOWN STRL REUS W/TWL XL LVL3 (GOWN DISPOSABLE) ×2
HANDPIECE INTERPULSE COAX TIP (DISPOSABLE) ×1
HOOD PEEL AWAY FACE SHEILD DIS (HOOD) ×4 IMPLANT
KIT BASIN OR (CUSTOM PROCEDURE TRAY) ×2 IMPLANT
KIT ROOM TURNOVER OR (KITS) ×2 IMPLANT
MANIFOLD NEPTUNE II (INSTRUMENTS) ×2 IMPLANT
NEEDLE SPNL 18GX3.5 QUINCKE PK (NEEDLE) IMPLANT
NS IRRIG 1000ML POUR BTL (IV SOLUTION) ×2 IMPLANT
PACK TOTAL JOINT (CUSTOM PROCEDURE TRAY) ×2 IMPLANT
PAD ARMBOARD 7.5X6 YLW CONV (MISCELLANEOUS) ×4 IMPLANT
SET HNDPC FAN SPRY TIP SCT (DISPOSABLE) ×1 IMPLANT
SUT VIC AB 0 CT1 27 (SUTURE) ×1
SUT VIC AB 0 CT1 27XBRD ANBCTR (SUTURE) ×1 IMPLANT
SUT VIC AB 1 CTX 36 (SUTURE) ×1
SUT VIC AB 1 CTX36XBRD ANBCTR (SUTURE) ×1 IMPLANT
SUT VIC AB 2-0 CT1 27 (SUTURE)
SUT VIC AB 2-0 CT1 TAPERPNT 27 (SUTURE) IMPLANT
SUT VIC AB 3-0 CT1 27 (SUTURE) ×1
SUT VIC AB 3-0 CT1 TAPERPNT 27 (SUTURE) ×1 IMPLANT
SYR 50ML LL SCALE MARK (SYRINGE) ×2 IMPLANT
TOWEL OR 17X24 6PK STRL BLUE (TOWEL DISPOSABLE) ×2 IMPLANT
TOWEL OR 17X26 10 PK STRL BLUE (TOWEL DISPOSABLE) ×2 IMPLANT
TRAY CATH 16FR W/PLASTIC CATH (SET/KITS/TRAYS/PACK) IMPLANT
WATER STERILE IRR 1000ML POUR (IV SOLUTION) ×6 IMPLANT

## 2015-06-02 NOTE — Transfer of Care (Signed)
Immediate Anesthesia Transfer of Care Note  Patient: Mary [name]  Procedure(s) Performed: Procedure(s): LEFT TOTAL KNEE ARTHROPLASTY (Left)  Patient Location: PACU  Anesthesia Type:GA combined with regional for post-op pain  Level of Consciousness: awake, alert  and oriented  Airway & Oxygen Therapy: Patient Spontanous Breathing and Patient connected to nasal cannula oxygen  Post-op Assessment: Report given to RN, Post -op Vital signs reviewed and stable and Patient moving all extremities X 4  Post vital signs: Reviewed and stable  Last Vitals:  Filed Vitals:   06/02/15 0751  BP: 141/80  Pulse: 61  Temp: 36.4 C  Resp: 20    Last Pain: There were no vitals filed for this visit.       Complications: No apparent anesthesia complications

## 2015-06-02 NOTE — Anesthesia Procedure Notes (Addendum)
Anesthesia Regional Block:  Adductor canal block  Pre-Anesthetic Checklist: ,, timeout performed, Correct Patient, Correct Site, Correct Laterality, Correct Procedure, Correct Position, risks and benefits discussed,, pre-op evaluation, at surgeon's request and post-op pain management  Laterality: Left  Prep: Dura Prep       Needles:  Injection technique: Single-shot  Needle Type: Echogenic Needle     Needle Length: 9cm 9 cm Needle Gauge: 24 and 24 G    Additional Needles:  Procedures: ultrasound guided (picture in chart) Adductor canal block Narrative:  Start time: 06/02/2015 9:33 AM Injection made incrementally with aspirations every 5 mL.  Performed by: Personally  Anesthesiologist: Cristela BlueJACKSON, KYLE  Additional Notes: 30cc .5% marcaine Dr Hart RochesterHollis in attendence No Sx   Procedure Name: LMA Insertion Date/Time: 06/02/2015 9:50 AM Performed by: Lanell MatarBAKER, Denorris Reust M Pre-anesthesia Checklist: Patient identified, Emergency Drugs available, Suction available and Patient being monitored Patient Re-evaluated:Patient Re-evaluated prior to inductionOxygen Delivery Method: Circle System Utilized Preoxygenation: Pre-oxygenation with 100% oxygen Intubation Type: IV induction Ventilation: Mask ventilation without difficulty LMA: LMA inserted LMA Size: 4.0 Number of attempts: 1 Placement Confirmation: positive ETCO2 Tube secured with: Tape Dental Injury: Teeth and Oropharynx as per pre-operative assessment

## 2015-06-02 NOTE — Progress Notes (Signed)
Orthopedic Tech Progress Note Patient Details:  Mary [name] [birth year] [id number] Ortho visit put on cpm at 1815. Patient ID: Mary [name], female   DOB: [birth year], 75 y.o.   MRN: [id number]   Jennye MoccasinHughes, Kyshon Tolliver Craig 06/02/2015, 6:15 PM

## 2015-06-02 NOTE — Discharge Instructions (Signed)

## 2015-06-02 NOTE — Anesthesia Preprocedure Evaluation (Addendum)
Anesthesia Evaluation  Patient identified by MRN, date of birth, ID band Patient awake    Reviewed: Allergy & Precautions, H&P , Patient's Chart, lab work & pertinent test results  Airway Mallampati: II  TM Distance: >3 FB Neck ROM: full    Dental no notable dental hx. (+) Edentulous Upper, Dental Advisory Given   Pulmonary former smoker,    Pulmonary exam normal breath sounds clear to auscultation       Cardiovascular Exercise Tolerance: Good  Rhythm:regular Rate:Normal     Neuro/Psych    GI/Hepatic   Endo/Other  diabetes  Renal/GU      Musculoskeletal   Abdominal   Peds  Hematology   Anesthesia Other Findings   Reproductive/Obstetrics                            Anesthesia Physical Anesthesia Plan  ASA: II  Anesthesia Plan: Spinal   Post-op Pain Management:    Induction:   Airway Management Planned:   Additional Equipment:   Intra-op Plan:   Post-operative Plan:   Informed Consent: I have reviewed the patients History and Physical, chart, labs and discussed the procedure including the risks, benefits and alternatives for the proposed anesthesia with the patient or authorized representative who has indicated his/her understanding and acceptance.   Dental Advisory Given  Plan Discussed with: CRNA  Anesthesia Plan Comments: (Lab work confirmed with CRNA in room. Platelets okay. Discussed spinal anesthetic, and patient consents to the procedure:  included risk of possible headache,backache, failed block, allergic reaction, and nerve injury. This patient was asked if she had any questions or concerns before the procedure started. )        Anesthesia Quick Evaluation

## 2015-06-02 NOTE — Progress Notes (Signed)
Orthopedic Tech Progress Note Patient Details:  Mary [name] 26-Oct-1940 [id number]  CPM Left Knee CPM Left Knee: On Left Knee Flexion (Degrees): 40 Left Knee Extension (Degrees): 0 Additional Comments: trapeze bar patient helper Viewed order from doctor's order list  Nikki DomCrawford, Leelan Rajewski 06/02/2015, 12:29 PM

## 2015-06-02 NOTE — Op Note (Signed)
PATIENT ID:      Eleen Litz  MRN:     [id number] DOB/AGE:    [birth year] / 75 y.o.       OPERATIVE REPORT    DATE OF PROCEDURE:  06/02/2015       PREOPERATIVE DIAGNOSIS:   LEFT KNEE OSTEOARTHRITIS      Estimated body mass index is 25.84 kg/(m^2) as calculated from the following:   Height as of this encounter:  (1.676 m).   Weight as of this encounter: 72.576 kg (160 lb).                                                        POSTOPERATIVE DIAGNOSIS:   LEFT KNEE OSTEOARTHRITIS                                                                      PROCEDURE:  Procedure(s): LEFT TOTAL KNEE ARTHROPLASTY Using DepuyAttune RP implants #6L Femur, #6Tibia, 5 mm Attune RP bearing, 38 Patella     SURGEON: Telford Archambeau J    ASSISTANT:   Eric K. [employer]   (Present and scrubbed throughout the case, critical for assistance with exposure, retraction, instrumentation, and closure.)         ANESTHESIA: GET, 20cc Exparel, 20cc 0.5% Marcaine  EBL: 300  FLUID REPLACEMENT: 1600 crystalloid  TOURNIQUET TIME:  Drains: None  Tranexamic Acid: 2gm topical   COMPLICATIONS:  None         INDICATIONS FOR PROCEDURE: The patient has  LEFT KNEE OSTEOARTHRITIS, Var deformities, XR shows bone on bone arthritis, lateral subluxation of tibia. Patient has failed all conservative measures including anti-inflammatory medicines, narcotics, attempts at  exercise and weight loss, cortisone injections and viscosupplementation.  Risks and benefits of surgery have been discussed, questions answered.   DESCRIPTION OF PROCEDURE: The patient identified by armband, received  IV antibiotics, in the holding area at [facility]. Patient taken to the operating room, appropriate anesthetic  monitors were attached, and GLMA anesthesia was  induced. Tourniquet  applied high to the operative thigh. Lateral post and foot positioner  applied to the table, the lower extremity was then prepped and draped  in usual  sterile fashion from the toes to the tourniquet. Time-out procedure was performed. We began the operation, with the knee flexed 120 degrees, by making the anterior midline incision starting at handbreadth above the patella going over the patella 1 cm medial to and 4 cm distal to the tibial tubercle. Small bleeders in the skin and the  subcutaneous tissue identified and cauterized. Transverse retinaculum was incised and reflected medially and a medial parapatellar arthrotomy was accomplished. the patella was everted and theprepatellar fat pad resected. The superficial medial collateral  ligament was then elevated from anterior to posterior along the proximal  flare of the tibia and anterior half of the menisci resected. The knee was hyperflexed exposing bone on bone arthritis. Peripheral and notch osteophytes as well as the cruciate ligaments were then resected. We continued to  work our way around posteriorly along the proximal tibia, and  externally  rotated the tibia subluxing it out from underneath the femur. A McHale  retractor was placed through the notch and a lateral Hohmann retractor  placed, and we then drilled through the proximal tibia in line with the  axis of the tibia followed by an intramedullary guide rod and 2-degree  posterior slope cutting guide. The tibial cutting guide, 3 degree posterior sloped, was pinned into place allowing resection of 6 mm of bone medially and 12 mm of bone laterally. Satisfied with the tibial resection, we then  entered the distal femur 2 mm anterior to the PCL origin with the  intramedullary guide rod and applied the distal femoral cutting guide  set at 9 mm, with 5 degrees of valgus. This was pinned along the  epicondylar axis. At this point, the distal femoral cut was accomplished without difficulty. We then sized for a #6L femoral component and pinned the guide in 3 degrees of external rotation. The chamfer cutting guide was pinned into place. The anterior,  posterior, and chamfer cuts were accomplished without difficulty followed by  the Attune RP box cutting guide and the box cut. We also removed posterior osteophytes from the posterior femoral condyles. At this  time, the knee was brought into full extension. We checked our  extension and flexion gaps and found them symmetric for a 5 mm bearing. Distracting in extension with a lamina spreader, the posterior horns of the menisci were removed, and Exparel, diluted to 60 cc, with 20cc NS, and 20cc 0.5% Marcaine,was injected into the capsule and synovium of the knee. The posterior patella cut was accomplished with the 9.5 mm Attune cutting guide, sized for a 38mm dome, and the fixation pegs drilled.The knee  was then once again hyperflexed exposing the proximal tibia. We sized for a # 6 tibial base plate, applied the smokestack and the conical reamer followed by the the Delta fin keel punch. We then hammered into place the Attune RP trial femoral component, drilled the lugs, inserted a  5 mm trial bearing, trial patellar button, and took the knee through range of motion from 0-130 degrees. No thumb pressure was required for patellar Tracking. At this point, the limb was wrapped with an Esmarch bandage and the tourniquet inflated to 350 mmHg. All trial components were removed, mating surfaces irrigated with pulse lavage, and dried with suction and sponges. A double batch of DePuy HV cement with 1500 mg of Zinacef was mixed and applied to all bony metallic mating surfaces except for the posterior condyles of the femur itself. In order, we  hammered into place the tibial tray and removed excess cement, the femoral component and removed excess cement. The final Attune RP bearing  was inserted, and the knee brought to full extension with compression.  The patellar button was clamped into place, and excess cement  removed. While the cement cured the wound was irrigated out with normal saline solution pulse lavage.  Ligament stability and patellar tracking were checked and found to be excellent. The parapatellar arthrotomy was closed with  running #1 Vicryl suture. The subcutaneous tissue with 0 and 2-0 undyed  Vicryl suture, and the skin with running 3-0 SQ vicryl. A dressing of Xeroform,  4 x 4, dressing sponges, Webril, and Ace wrap applied. The patient  awakened, and taken to recovery room without difficulty.   Jaykub Mackins J 06/02/2015, 11:02 AM

## 2015-06-02 NOTE — Interval H&P Note (Signed)
History and Physical Interval Note:  06/02/2015 7:47 AM  Mary [name]  has presented today for surgery, with the diagnosis of LEFT KNEE OSTEOARTHRITIS  The various methods of treatment have been discussed with the patient and family. After consideration of risks, benefits and other options for treatment, the patient has consented to  Procedure(s): LEFT TOTAL KNEE ARTHROPLASTY (Left) as a surgical intervention .  The patient's history has been reviewed, patient examined, no change in status, stable for surgery.  I have reviewed the patient's chart and labs.  Questions were answered to the patient's satisfaction.     Nestor LewandowskyOWAN,Novalie Leamy J

## 2015-06-03 ENCOUNTER — Encounter (HOSPITAL_COMMUNITY): Payer: Self-pay | Admitting: Orthopedic Surgery

## 2015-06-03 LAB — HEMOGLOBIN A1C
HEMOGLOBIN A1C: 7.4 % — AB (ref 4.8–5.6)
HEMOGLOBIN A1C: 7.2 % — AB (ref 4.8–5.6)
MEAN PLASMA GLUCOSE: 166 mg/dL
MEAN PLASMA GLUCOSE: 160 mg/dL

## 2015-06-03 LAB — BASIC METABOLIC PANEL
ANION GAP: 10 (ref 5–15)
BUN: 5 mg/dL — AB (ref 6–20)
CO2: 28 mmol/L (ref 22–32)
Calcium: 9 mg/dL (ref 8.9–10.3)
Chloride: 96 mmol/L — ABNORMAL LOW (ref 101–111)
Creatinine, Ser: 0.56 mg/dL (ref 0.44–1.00)
GFR calc Af Amer: >60 mL/min (ref >60)
Glucose, Bld: 198 mg/dL — ABNORMAL HIGH (ref 65–99)
POTASSIUM: 3.9 mmol/L (ref 3.5–5.1)
SODIUM: 134 mmol/L — AB (ref 135–145)

## 2015-06-03 LAB — GLUCOSE, CAPILLARY
GLUCOSE-CAPILLARY: 207 mg/dL — AB (ref 65–99)
Glucose-Capillary: 231 mg/dL — ABNORMAL HIGH (ref 65–99)
Glucose-Capillary: 245 mg/dL — ABNORMAL HIGH (ref 65–99)
Glucose-Capillary: 171 mg/dL — ABNORMAL HIGH (ref 65–99)

## 2015-06-03 LAB — CBC
HCT: 35 % — ABNORMAL LOW (ref 36.0–46.0)
Hemoglobin: 11.2 g/dL — ABNORMAL LOW (ref 12.0–15.0)
MCH: 27.2 pg (ref 26.0–34.0)
MCHC: 32 g/dL (ref 30.0–36.0)
MCV: 85 fL (ref 78.0–100.0)
PLATELETS: 256 10*3/uL (ref 150–400)
RBC: 4.12 MIL/uL (ref 3.87–5.11)
RDW: 12.5 % (ref 11.5–15.5)
WBC: 12.2 10*3/uL — AB (ref 4.0–10.5)

## 2015-06-03 NOTE — Progress Notes (Signed)
Patient ID: Mary [name], female   DOB: 04-30-1940, 75 y.o.   MRN: [id number] PATIENT ID: Mary [name]  MRN: [id number]  DOB/AGE:  04-30-1940 / 75 y.o.  1 Day Post-Op Procedure(s) (LRB): LEFT TOTAL KNEE ARTHROPLASTY (Left)    PROGRESS NOTE Subjective: Patient is alert, oriented, no Nausea, no Vomiting, yes passing gas. Taking PO well. Denies SOB, Chest or Calf Pain. Using Incentive Spirometer, PAS in place. Ambulate WBAT today, CPM 0-40 Patient reports pain as 7/10 .    Objective: Vital signs in last 24 hours: Filed Vitals:   06/02/15 1519 06/02/15 1947 06/03/15 0002 06/03/15 0410  BP: 108/60 156/79 127/59 136/69  Pulse: 67 83 77 90  Temp: 97.7 F (36.5 C) 98.2 F (36.8 C) 98.1 F (36.7 C) 99 F (37.2 C)  TempSrc: Oral Oral Oral Oral  Resp: 16 16 16 18   Height:      Weight:      SpO2: 95% 97% 93% 91%      Intake/Output from previous day: I/O last 3 completed shifts: In: 3441.3 [I.V.:3341.3; IV Piggyback:100] Out: 450 [Urine:250; Blood:200]   Intake/Output this shift:     LABORATORY DATA:  Recent Labs  06/02/15 0849  06/02/15 1753 06/02/15 2110 06/03/15 0333 06/03/15 0645  WBC 7.0  --   --   --  12.2*  --   HGB 12.4  --   --   --  11.2*  --   HCT 39.3  --   --   --  35.0*  --   PLT 235  --   --   --  256  --   NA 140  --   --   --  134*  --   K 3.9  --   --   --  3.9  --   CL 107  --   --   --  96*  --   CO2 25  --   --   --  28  --   BUN 16  --   --   --  5*  --   CREATININE 0.67  --   --   --  0.56  --   GLUCOSE 137*  --   --   --  198*  --   GLUCAP  --   < > 184* 217*  --  231*  INR 1.20  --   --   --   --   --   CALCIUM 9.6  --   --   --  9.0  --   < > = values in this interval not displayed.  Examination: Neurologically intact ABD soft Neurovascular intact Sensation intact distally Intact pulses distally Dorsiflexion/Plantar flexion intact Incision: dressing C/D/I No cellulitis present Compartment soft}  Assessment:   1 Day Post-Op Procedure(s)  (LRB): LEFT TOTAL KNEE ARTHROPLASTY (Left) ADDITIONAL DIAGNOSIS: Expected Acute Blood Loss Anemia, Diabetes and Hypertension, history of coronary artery bypass surgery stable  Plan: PT/OT WBAT, CPM 5/hrs day until ROM 0-90 degrees, then D/C CPM DVT Prophylaxis:  SCDx72hrs, ASA 325 mg BID x 2 weeks DISCHARGE PLAN: Home DISCHARGE NEEDS: HHPT, CPM, Walker and 3-in-1 comode seat     Kordae Buonocore J 06/03/2015, 7:15 AM

## 2015-06-03 NOTE — Evaluation (Signed)
Physical Therapy Evaluation Patient Details Name: Mary [name] MRN: [id number] DOB: May 21, 1940 Today's Date: 06/03/2015   History of Present Illness  Pt is a 75 y.o. female now s/p Lt TKA. PMH: diabetes, CAD, CABG, back surgery, Rt TKA.   Clinical Impression  Pt is s/p TKA resulting in the deficits listed below (see PT Problem List).  Pt will benefit from skilled PT to increase their independence and safety with mobility to allow discharge to home with family support.      Follow Up Recommendations Home health PT;Supervision for mobility/OOB    Equipment Recommendations  None recommended by PT    Recommendations for Other Services       Precautions / Restrictions Precautions Precautions: Knee;Fall Precaution Booklet Issued: Yes (comment) Precaution Comments: HEP, knee extension precautions Restrictions Weight Bearing Restrictions: Yes LLE Weight Bearing: Weight bearing as tolerated      Mobility  Bed Mobility Overal bed mobility: Needs Assistance Bed Mobility: Supine to Sit     Supine to sit: Min assist;HOB elevated (use of rail, assist with LLE provided. )        Transfers Overall transfer level: Needs assistance Equipment used: Rolling walker (2 wheeled) Transfers: Sit to/from Stand Sit to Stand: Min assist         General transfer comment: cues for hand position  Ambulation/Gait Ambulation/Gait assistance: Min guard Ambulation Distance (Feet): 25 Feet Assistive device: Rolling walker (2 wheeled) Gait Pattern/deviations: Step-to pattern;Decreased weight shift to left;Decreased stance time - left Gait velocity: decreased   General Gait Details: encouraging weightbearing through LLE  Stairs            Wheelchair Mobility    Modified Rankin (Stroke Patients Only)       Balance Overall balance assessment: Needs assistance Sitting-balance support: No upper extremity supported Sitting balance-Leahy Scale: Fair     Standing balance support:  Bilateral upper extremity supported Standing balance-Leahy Scale: Poor Standing balance comment: using rw                             Pertinent Vitals/Pain Pain Assessment: 0-10 Pain Score: 8  Pain Location: Lt knee Pain Descriptors / Indicators: Aching Pain Intervention(s): Limited activity within patient's tolerance;Monitored during session;RN gave pain meds during session    Home Living Family/patient expects to be discharged to:: Private residence Living Arrangements: Alone Available Help at Discharge: Family;Available 24 hours/day Type of Home: Apartment Home Access: Stairs to enter Entrance Stairs-Rails: Left Entrance Stairs-Number of Steps: 10 Home Layout: One level Home Equipment: Walker - 2 wheels;Bedside commode Additional Comments: daughter will be staying with pt at D/C.    Prior Function Level of Independence: Independent               Hand Dominance        Extremity/Trunk Assessment   Upper Extremity Assessment: Overall WFL for tasks assessed           Lower Extremity Assessment: LLE deficits/detail   LLE Deficits / Details: unable to perform SLR     Communication   Communication: Prefers language other than [place];Other (comment) (prefers Spanish but understands most [place]. )  Cognition Arousal/Alertness: Awake/alert Behavior During Therapy: WFL for tasks assessed/performed Overall Cognitive Status: Within Functional Limits for tasks assessed                      General Comments      Exercises Total Joint Exercises Heel Slides: AAROM;Left;10  reps Goniometric ROM: knee flexion 60 degrees      Assessment/Plan    PT Assessment Patient needs continued PT services  PT Diagnosis Difficulty walking   PT Problem List Decreased strength;Decreased range of motion;Decreased activity tolerance;Decreased balance;Decreased mobility;Decreased coordination  PT Treatment Interventions DME instruction;Gait training;Stair  training;Functional mobility training;Therapeutic activities;Therapeutic exercise;Balance training;Patient/family education   PT Goals (Current goals can be found in the Care Plan section) Acute Rehab PT Goals Patient Stated Goal: go home and get back to being independent PT Goal Formulation: With patient Time For Goal Achievement: 06/17/15 Potential to Achieve Goals: Good    Frequency 7X/week   Barriers to discharge        Co-evaluation               End of Session Equipment Utilized During Treatment: Gait belt Activity Tolerance: Patient tolerated treatment well Patient left: in chair;with call bell/phone within reach;with family/visitor present (in knee extension) Nurse Communication: Mobility status         Time: [id number] PT Time Calculation (min) (ACUTE ONLY): 34 min   Charges:   PT Evaluation $PT Eval Moderate Complexity: 1 Procedure PT Treatments $Gait Training: 8-22 mins   PT G Codes:        Christiane HaBenjamin J. Gianelle Mccaul, PT, CSCS Pager [phone] Office [phone]  06/03/2015, 9:17 AM

## 2015-06-03 NOTE — Progress Notes (Signed)
Occupational Therapy Evaluation Patient Details Name: Mary [name] MRN: [id number] DOB: 01-Jan-1941 Today's Date: 06/03/2015    History of Present Illness Pt is a 75 y.o. female now s/p Lt TKA. PMH: diabetes, CAD, CABG, back surgery, Rt TKA.    Clinical Impression   PTA, pt was independent with ADLs and mobility. Pt currently requires min assist for LB ADLs and min guard assist for transfers. Pt's daughter is independent in assisting pt with dressing and bathing tasks and will be staying with pt 24/7. Pt will benefit from continued acute OT to increase independence and safety with ADLs and mobility to allow for safe discharge home. No OT follow up or DME recommended at this time.    Follow Up Recommendations  No OT follow up;Supervision/Assistance - 24 hour    Equipment Recommendations  None recommended by OT    Recommendations for Other Services       Precautions / Restrictions Precautions Precautions: Knee;Fall Precaution Booklet Issued: No Precaution Comments: Reviewed not placing pillow, blanket, ice pack or other object under knee. Pt with blanket under knee while in 0 degree bone foam on OT arrival Restrictions Weight Bearing Restrictions: Yes LLE Weight Bearing: Weight bearing as tolerated      Mobility Bed Mobility Overal bed mobility: Needs Assistance Bed Mobility: Supine to Sit     Supine to sit: Min assist;HOB elevated     General bed mobility comments: Min assist to move LLE off bed. HOB elevated, use of bedrails.  Transfers Overall transfer level: Needs assistance Equipment used: Rolling walker (2 wheeled) Transfers: Sit to/from Stand Sit to Stand: Min guard         General transfer comment: Min guard assist for safety and balance. Verbal cues for safe hand placement on seated surfaces.    Balance Overall balance assessment: Needs assistance Sitting-balance support: No upper extremity supported;Feet supported Sitting balance-Leahy Scale: Good      Standing balance support: Bilateral upper extremity supported;During functional activity Standing balance-Leahy Scale: Poor Standing balance comment: using rw                            ADL Overall ADL's : Needs assistance/impaired     Grooming: Wash/dry hands;Min guard;Standing;Cueing for sequencing Grooming Details (indicate cue type and reason): Cues to bring body closer to sink to avoid reaching outside BoS and increasing fall risk Upper Body Bathing: Set up;Sitting   Lower Body Bathing: Minimal assistance;Sit to/from stand;With caregiver independent assisting   Upper Body Dressing : Set up;Sitting   Lower Body Dressing: Minimal assistance;With caregiver independent assisting;Sit to/from stand   Toilet Transfer: Min guard;Cueing for safety;Ambulation;BSC;RW Toilet Transfer Details (indicate cue type and reason): BSC over toilet, cues to feel BSC on back of legs before reaching back to sit Toileting- Clothing Manipulation and Hygiene: Min guard;Sit to/from stand       Functional mobility during ADLs: Min guard;Rolling walker General ADL Comments: Daughter independent in assisting pt with ADLs.     Vision Vision Assessment?: No apparent visual deficits   Perception     Praxis      Pertinent Vitals/Pain Pain Assessment: Faces Pain Score: 8  Faces Pain Scale: Hurts little more Pain Location: L knee Pain Descriptors / Indicators: Aching Pain Intervention(s): Limited activity within patient's tolerance;Monitored during session;Premedicated before session;Repositioned     Hand Dominance Right   Extremity/Trunk Assessment Upper Extremity Assessment Upper Extremity Assessment: Overall WFL for tasks assessed   Lower Extremity Assessment  Lower Extremity Assessment: LLE deficits/detail LLE Deficits / Details: decreased ROM and strength as expected post op   Cervical / Trunk Assessment Cervical / Trunk Assessment: Normal   Communication  Communication Communication: Prefers language other than English (Spanish - does not understand much [place])   Cognition Arousal/Alertness: Awake/alert Behavior During Therapy: WFL for tasks assessed/performed Overall Cognitive Status: Within Functional Limits for tasks assessed       Memory: Decreased recall of precautions             General Comments       Exercises Exercises: Total Joint     Shoulder Instructions      Home Living Family/patient expects to be discharged to:: Private residence Living Arrangements: Alone Available Help at Discharge: Family;Available 24 hours/day Type of Home: Apartment Home Access: Stairs to enter Entrance Stairs-Number of Steps: 10 Entrance Stairs-Rails: Left Home Layout: One level     Bathroom Shower/Tub: Walk-in shower;Door   [employer] Toilet: Standard     Home Equipment: Environmental consultant - 2 wheels;Bedside commode;Grab bars - tub/shower;Hand held shower head;Shower seat   Additional Comments: daughter will be staying with pt at D/C.      Prior Functioning/Environment Level of Independence: Independent             OT Diagnosis: Acute pain   OT Problem List: Decreased range of motion;Decreased strength;Impaired balance (sitting and/or standing);Decreased activity tolerance;Decreased safety awareness;Decreased knowledge of use of DME or AE;Decreased knowledge of precautions;Pain   OT Treatment/Interventions: Self-care/ADL training;Therapeutic exercise;Energy conservation;DME and/or AE instruction;Therapeutic activities;Balance training;Patient/family education    OT Goals(Current goals can be found in the care plan section) Acute Rehab OT Goals Patient Stated Goal: go home and get back to being independent OT Goal Formulation: With patient Time For Goal Achievement: 06/17/15 Potential to Achieve Goals: Good ADL Goals Pt Will Transfer to Toilet: with modified independence;ambulating;bedside commode (over toilet) Pt Will Perform  Toileting - Clothing Manipulation and hygiene: with modified independence;sitting/lateral leans;sit to/from stand Pt Will Perform Tub/Shower Transfer: Shower transfer;with supervision;ambulating;rolling walker;shower seat  OT Frequency: Min 2X/week   Barriers to D/C:            Co-evaluation              End of Session Equipment Utilized During Treatment: Gait belt;Rolling walker CPM Left Knee CPM Left Knee: Off Nurse Communication: Mobility status  Activity Tolerance: Patient tolerated treatment well Patient left: in chair;with call bell/phone within reach;with family/visitor present   Time: 1610-9604 OT Time Calculation (min): 18 min Charges:  OT General Charges $OT Visit: 1 Procedure OT Evaluation $OT Eval Moderate Complexity: 1 Procedure G-Codes:    Nils Pyle, OTR/L Pager: [phone] 06/03/2015, 12:53 PM

## 2015-06-03 NOTE — Progress Notes (Signed)
Patient seen on am rounds. [facility]. She will be cared for at home by her son. She has all needed equipment. Her post op follow up appointment with Dr. Turner Danielsowan is set for 06/17/15 @ 915a and [place] [facility] [place].   Shauna Hughenee Angiulli, RN BPCI CM  [facility].  [phone]

## 2015-06-03 NOTE — Progress Notes (Signed)
Physical Therapy Treatment Patient Details Name: Mary [name] MRN: [id number] DOB: 1940/11/09 Today's Date: 06/03/2015    History of Present Illness Pt is a 75 y.o. female now s/p Lt TKA. PMH: diabetes, CAD, CABG, back surgery, Rt TKA.     PT Comments    Pt able to increase her ambulation distance to 70 feet with rw and min guard during second PT session. Cadence is slow but no loss of balance. Daughter present and assisting with communication during session. PT to continue to follow and progress mobility in anticipation of D/C to home with family support.   Follow Up Recommendations  Home health PT;Supervision for mobility/OOB     Equipment Recommendations  None recommended by PT    Recommendations for Other Services       Precautions / Restrictions Precautions Precautions: Knee;Fall Restrictions Weight Bearing Restrictions: Yes LLE Weight Bearing: Weight bearing as tolerated    Mobility  Bed Mobility Overal bed mobility: Needs Assistance Bed Mobility: Sit to Supine     Supine to sit: Min assist     General bed mobility comments: assist provided with LLE  Transfers Overall transfer level: Needs assistance Equipment used: Rolling walker (2 wheeled) Transfers: Sit to/from Stand Sit to Stand: Min guard         General transfer comment: cues needed for hand position with both sitting and standing   Ambulation/Gait Ambulation/Gait assistance: Min guard Ambulation Distance (Feet): 70 Feet Assistive device: Rolling walker (2 wheeled) Gait Pattern/deviations: Step-to pattern;Decreased stance time - left;Decreased weight shift to left Gait velocity: decreased   General Gait Details: encouraging weight through LLE, cues for keeping rw close.   Stairs            Wheelchair Mobility    Modified Rankin (Stroke Patients Only)       Balance Overall balance assessment: Needs assistance Sitting-balance support: No upper extremity supported Sitting  balance-Leahy Scale: Good     Standing balance support: Bilateral upper extremity supported Standing balance-Leahy Scale: Poor Standing balance comment: using rw                    Cognition Arousal/Alertness: Awake/alert Behavior During Therapy: WFL for tasks assessed/performed Overall Cognitive Status: Within Functional Limits for tasks assessed                      Exercises Total Joint Exercises Ankle Circles/Pumps: AROM;Both;10 reps Quad Sets: Strengthening;Left;10 reps Heel Slides: AAROM;Left;10 reps    General Comments General comments (skin integrity, edema, etc.): Pt relying on daughter to assist with communication.       Pertinent Vitals/Pain Pain Assessment: Faces Faces Pain Scale: Hurts a little bit Pain Location: Lt knee Pain Descriptors / Indicators: Grimacing Pain Intervention(s): Monitored during session    Home Living                      Prior Function            PT Goals (current goals can now be found in the care plan section) Acute Rehab PT Goals PT Goal Formulation: With patient Time For Goal Achievement: 06/17/15 Potential to Achieve Goals: Good Progress towards PT goals: Progressing toward goals    Frequency  7X/week    PT Plan Current plan remains appropriate    Co-evaluation             End of Session Equipment Utilized During Treatment: Gait belt Activity Tolerance: Patient tolerated treatment well Patient  left: in bed;with call bell/phone within reach;with family/visitor present;with SCD's reapplied;Other (comment) (in bone foam)     Time: [id number] PT Time Calculation (min) (ACUTE ONLY): 27 min  Charges:  $Gait Training: 8-22 mins $Therapeutic Exercise: 8-22 mins                    G Codes:      Christiane HaBenjamin J. Elisa Sorlie, PT, CSCS Pager [phone] Office [phone]  06/03/2015, 4:00 PM

## 2015-06-04 LAB — CBC
HCT: 30.6 % — ABNORMAL LOW (ref 36.0–46.0)
Hemoglobin: 9.8 g/dL — ABNORMAL LOW (ref 12.0–15.0)
MCH: 27.5 pg (ref 26.0–34.0)
MCHC: 32 g/dL (ref 30.0–36.0)
MCV: 85.7 fL (ref 78.0–100.0)
PLATELETS: 202 10*3/uL (ref 150–400)
RBC: 3.57 MIL/uL — ABNORMAL LOW (ref 3.87–5.11)
RDW: 12.9 % (ref 11.5–15.5)
WBC: 11 10*3/uL — AB (ref 4.0–10.5)

## 2015-06-04 LAB — GLUCOSE, CAPILLARY
GLUCOSE-CAPILLARY: 166 mg/dL — AB (ref 65–99)
GLUCOSE-CAPILLARY: 167 mg/dL — AB (ref 65–99)

## 2015-06-04 NOTE — Progress Notes (Signed)
Occupational Therapy Treatment and Discharge Patient Details Name: Mary [name] MRN: [id number] DOB: Jul 23, 1940 Today's Date: 06/04/2015    History of present illness Pt is a 75 y.o. female now s/p Lt TKA. PMH: diabetes, CAD, CABG, back surgery, Rt TKA.    OT comments  Pt able to meet all goals adequately for d/c from acute OT services. Pt currently overall supervision for safety with toilet and walk in shower transfers. Daughter will be assisting 24/7 upon return home. All education complete; pt and daughter with no further questions for OT at this time. No further acute OT needs identified; signing off at this time. Please re-consult if needs change.   Follow Up Recommendations  No OT follow up;Supervision/Assistance - 24 hour    Equipment Recommendations  None recommended by OT    Recommendations for Other Services      Precautions / Restrictions Precautions Precautions: Knee;Fall Restrictions Weight Bearing Restrictions: Yes LLE Weight Bearing: Weight bearing as tolerated       Mobility Bed Mobility Overal bed mobility: Needs Assistance Bed Mobility: Supine to Sit     Supine to sit: Min assist     General bed mobility comments: Min A for LLE to EOB.  Transfers Overall transfer level: Needs assistance Equipment used: Rolling walker (2 wheeled) Transfers: Sit to/from Stand Sit to Stand: Min guard         General transfer comment: Good hand placement and technique.    Balance Overall balance assessment: Needs assistance Sitting-balance support: No upper extremity supported;Feet supported Sitting balance-Leahy Scale: Good     Standing balance support: Bilateral upper extremity supported;During functional activity Standing balance-Leahy Scale: Poor Standing balance comment: RW for support                   ADL Overall ADL's : Needs assistance/impaired                         Toilet Transfer: Supervision/safety;Ambulation;BSC;RW (BSC over  toilet)   Toileting- Clothing Manipulation and Hygiene: Supervision/safety;Sit to/from stand   Tub/ Shower Transfer: Supervision/safety;Ambulation;3 in 1;Rolling walker;Walk-in Lobbyist Details (indicate cue type and reason): Educated pt on walk in shower transfer technique; pt able to return demo with supervision for safety.  Functional mobility during ADLs: Supervision/safety;Rolling walker General ADL Comments: Educated on use of 3 in 1 as a seat in shower.       Vision                     Perception     Praxis      Cognition   Behavior During Therapy: WFL for tasks assessed/performed Overall Cognitive Status: Within Functional Limits for tasks assessed                       Extremity/Trunk Assessment               Exercises   Shoulder Instructions       General Comments      Pertinent Vitals/ Pain       Pain Assessment: Faces Pain Score: 3  Faces Pain Scale: Hurts a little bit Pain Location: L knee Pain Descriptors / Indicators: Sore Pain Intervention(s): Monitored during session;Repositioned  Home Living  Prior Functioning/Environment              Frequency       Progress Toward Goals  OT Goals(current goals can now be found in the care plan section)  Progress towards OT goals: Goals met/education completed, patient discharged from OT  Acute Rehab OT Goals Patient Stated Goal: go home today OT Goal Formulation: All assessment and education complete, DC therapy  Plan Discharge plan remains appropriate;All goals met and education completed, patient discharged from OT services    Co-evaluation                 End of Session Equipment Utilized During Treatment: Rolling walker   Activity Tolerance Patient tolerated treatment well   Patient Left in chair;with call bell/phone within reach;with family/visitor present   Nurse Communication           Time: 7915-0569 OT Time Calculation (min): 11 min  Charges: OT General Charges $OT Visit: 1 Procedure OT Treatments $Self Care/Home Management : 8-22 mins  Binnie Kand M.S., OTR/L Pager: [phone]  06/04/2015, 10:56 AM

## 2015-06-04 NOTE — Care Management Note (Signed)
Case Management Note  Patient Details  Name: Mary [name] MRN: [id number] Date of Birth: [birth year]  Subjective/Objective:    75 yr old female s/p left total knee arthroplasty.                Action/Plan:  Case manager spoke with patient at the bedside, concerning home health and DME needs. Choice was offered for home health and DME needs. [facility]. CM will fax orders, H & P and op notes to Lupita LeashDonna at [facility]. Patient states she has rolling walker and 3in1. Will have family support at discharge.   Expected Discharge Date:   06/04/15               Expected Discharge Plan:  Home w Home Health Services  In-House Referral:     Discharge planning Services  CM Consult  Post Acute Care Choice:    Choice offered to:  Patient  DME Arranged:  N/A (patient has rolling walker and 3in1) DME Agency:     HH Arranged:  PT HH Agency[facility]  [facility]  Status of Service:  Completed, signed off  Medicare Important Message Given:  Yes Date Medicare IM Given:    Medicare IM give by:    Date Additional Medicare IM Given:    Additional Medicare Important Message give by:     If discussed at Long Length of Stay Meetings, dates discussed:    Additional Comments:  Durenda GuthrieBrady, Armonii Sieh Naomi, RN 06/04/2015, 1:12 PM

## 2015-06-04 NOTE — Progress Notes (Signed)
Physical Therapy Treatment Patient Details Name: Mary [name] MRN: [id number] DOB: [birth year] Today's Date: 06/04/2015    History of Present Illness Pt is a 75 y.o. female now s/p Lt TKA. PMH: diabetes, CAD, CABG, back surgery, Rt TKA.     PT Comments    Session focused on stair training. By end of session, pt and daughter report feeling confident with ability to ambulate up/down her stairs to enter he home. Both deny any questions or concerns.   Follow Up Recommendations  Home health PT;Supervision for mobility/OOB     Equipment Recommendations  None recommended by PT    Recommendations for Other Services       Precautions / Restrictions Precautions Precautions: Knee;Fall Restrictions Weight Bearing Restrictions: Yes LLE Weight Bearing: Weight bearing as tolerated    Mobility  Bed Mobility            General bed mobility comments: up in chair upon arrival  Transfers Overall transfer level: Needs assistance Equipment used: Rolling walker (2 wheeled) Transfers: Sit to/from Stand Sit to Stand: Min guard         General transfer comment: no cues needed for hand position  Ambulation/Gait                 Stairs Stairs: Yes Stairs assistance: Min assist Stair Management: One rail Left;Step to pattern;Sideways Number of Stairs: 8 (family reports having 8 steps to enter home) General stair comments: Daughter observing stairs sequence. Pt/daughter deny any questions or concerns.   Wheelchair Mobility    Modified Rankin (Stroke Patients Only)       Balance Overall balance assessment: Needs assistance Sitting-balance support: No upper extremity supported Sitting balance-Leahy Scale: Good     Standing balance support: Bilateral upper extremity supported Standing balance-Leahy Scale: Poor Standing balance comment: using rw                    Cognition Arousal/Alertness: Awake/alert Behavior During Therapy: WFL for tasks  assessed/performed Overall Cognitive Status: Within Functional Limits for tasks assessed                      Exercises      General Comments        Pertinent Vitals/Pain Pain Assessment: Faces Faces Pain Scale: Hurts a little bit Pain Location: Lt knee  Pain Descriptors / Indicators: Grimacing Pain Intervention(s): Monitored during session;Limited activity within patient's tolerance    Home Living                      Prior Function            PT Goals (current goals can now be found in the care plan section) Acute Rehab PT Goals Patient Stated Goal: go home today PT Goal Formulation: With patient Time For Goal Achievement: 06/17/15 Potential to Achieve Goals: Good Progress towards PT goals: Progressing toward goals    Frequency  7X/week    PT Plan Current plan remains appropriate    Co-evaluation             End of Session Equipment Utilized During Treatment: Gait belt Activity Tolerance: Patient tolerated treatment well Patient left: in chair;with call bell/phone within reach;with family/visitor present (in knee extension)     Time: 9147-8295 PT Time Calculation (min) (ACUTE ONLY): 17 min  Charges:  $Gait Training: 8-22 mins  G Codes:      Christiane HaBenjamin J. Reyan Helle, PT, CSCS Pager [phone] Office [phone]  06/04/2015, 1:59 PM

## 2015-06-04 NOTE — Discharge Summary (Signed)
Patient ID: Mary [name] MRN: [id number] DOB/AGE: 1940-02-04 75 y.o.  Admit date: 06/02/2015 Discharge date: 06/04/2015  Admission Diagnoses:  Active Problems:   Primary osteoarthritis of knee   Discharge Diagnoses:  Same  Past Medical History  Diagnosis Date  . Hyperlipemia   . Diabetes mellitus without complication (HCC)     DM II  . Arthritis   . H/O transfusion   . Coronary artery disease     Surgeries: Procedure(s): LEFT TOTAL KNEE ARTHROPLASTY on 06/02/2015   Consultants:    Discharged Condition: Improved  Hospital Course: Mary Blendlsa Feeley is an 75 y.o. female who was admitted 06/02/2015 for operative treatment of<principal problem not specified>. Patient has severe unremitting pain that affects sleep, daily activities, and work/hobbies. After pre-op clearance the patient was taken to the operating room on 06/02/2015 and underwent  Procedure(s): LEFT TOTAL KNEE ARTHROPLASTY.    Patient was given perioperative antibiotics: Anti-infectives    Start     Dose/Rate Route Frequency Ordered Stop   06/02/15 1022  cefUROXime (ZINACEF) injection  Status:  Discontinued       As needed 06/02/15 1022 06/02/15 1130   06/02/15 0930  ceFAZolin (ANCEF) IVPB 2g/100 mL premix     2 g 200 mL/hr over 30 Minutes Intravenous To ShortStay Surgical 06/01/15 1355 06/02/15 0956       Patient was given sequential compression devices, early ambulation, and chemoprophylaxis to prevent DVT.  Patient benefited maximally from hospital stay and there were no complications.    Recent vital signs: Patient Vitals for the past 24 hrs:  BP Temp Temp src Pulse Resp SpO2  06/04/15 0602 132/66 mmHg 98.1 F (36.7 C) Oral 91 - 90 %  06/03/15 2330 - 100.1 F (37.8 C) Oral - - -  06/03/15 2254 - - - - - 97 %  06/03/15 2228 (!) 128/55 mmHg (!) 101.8 F (38.8 C) - (!) 108 - (!) 88 %  06/03/15 1333 137/73 mmHg 99.5 F (37.5 C) - 100 16 92 %     Recent laboratory studies:  Recent Labs  06/02/15 0849  06/03/15 0333 06/04/15 0253  WBC 7.0 12.2* 11.0*  HGB 12.4 11.2* 9.8*  HCT 39.3 35.0* 30.6*  PLT 235 256 202  NA 140 134*  --   K 3.9 3.9  --   CL 107 96*  --   CO2 25 28  --   BUN 16 5*  --   CREATININE 0.67 0.56  --   GLUCOSE 137* 198*  --   INR 1.20  --   --   CALCIUM 9.6 9.0  --      Discharge Medications:     Medication List    TAKE these medications        acetaminophen 325 MG tablet  Commonly known as:  TYLENOL  Take 650 mg by mouth every 6 (six) hours as needed.     amLODipine-benazepril 10-20 MG capsule  Commonly known as:  LOTREL  Take 1 capsule by mouth daily.     aspirin EC 325 MG tablet  Take 1 tablet (325 mg total) by mouth 2 (two) times daily.     clopidogrel 75 MG tablet  Commonly known as:  PLAVIX  Take 75 mg by mouth daily.     fenofibrate 145 MG tablet  Commonly known as:  TRICOR  Take 145 mg by mouth daily.     Fish Oil 1000 MG Caps  Take 1,000 mg by mouth daily.     oxyCODONE-acetaminophen  5-325 MG tablet  Commonly known as:  ROXICET  Take 1 tablet by mouth every 4 (four) hours as needed.     rosuvastatin 10 MG tablet  Commonly known as:  CRESTOR  Take 10 mg by mouth every evening.     SitaGLIPtin-MetFORMIN HCl 50-1000 MG Tb24  Take 1 tablet by mouth daily.     tiZANidine 2 MG tablet  Commonly known as:  ZANAFLEX  Take 1 tablet (2 mg total) by mouth every 6 (six) hours as needed for muscle spasms.        Diagnostic Studies: Dg Chest 2 View  06/02/2015  CLINICAL DATA:  Preoperative for knee surgery EXAM: CHEST  2 VIEW COMPARISON:  10/22/2014 chest radiograph. FINDINGS: Sternotomy wires appear aligned and intact. CABG clips overlie the mediastinum Stable cardiomediastinal silhouette with top-normal heart size. No pneumothorax. No pleural effusion. Lungs appear clear, with no acute consolidative airspace disease and no pulmonary edema. IMPRESSION: No active cardiopulmonary disease. Electronically Signed   By: Delbert Phenix M.D.   On:  06/02/2015 09:01    Disposition: 01-Home or Self Care      Discharge Instructions    CPM    Complete by:  As directed   Continuous passive motion machine (CPM):      Use the CPM from 0 to 60  for 5 hours per day.      You may increase by 10 degrees per day.  You may break it up into 2 or 3 sessions per day.      Use CPM for 2 weeks or until you are told to stop.     Call MD / Call 911    Complete by:  As directed   If you experience chest pain or shortness of breath, CALL 911 and be transported to the hospital emergency room.  If you develope a fever above 101 F, pus (white drainage) or increased drainage or redness at the wound, or calf pain, call your surgeon's office.     Constipation Prevention    Complete by:  As directed   Drink plenty of fluids.  Prune juice may be helpful.  You may use a stool softener, such as Colace (over the counter) 100 mg twice a day.  Use MiraLax (over the counter) for constipation as needed.     Diet - low sodium heart healthy    Complete by:  As directed      Driving restrictions    Complete by:  As directed   No driving for 2 weeks     Increase activity slowly as tolerated    Complete by:  As directed      Patient may shower    Complete by:  As directed   You may shower without a dressing once there is no drainage.  Do not wash over the wound.  If drainage remains, cover wound with plastic wrap and then shower.           Follow-up Information    Follow up with Nestor Lewandowsky, MD In 2 weeks.   Specialty:  Orthopedic Surgery   Contact information:   [address] [place] [address] [phone]        Signed: Henry Russel 06/04/2015, 7:51 AM

## 2015-06-04 NOTE — Progress Notes (Signed)
PATIENT ID: Mary [name]  MRN: [id number]  DOB/AGE:  [birth year] / 75 y.o.  2 Days Post-Op Procedure(s) (LRB): LEFT TOTAL KNEE ARTHROPLASTY (Left)    PROGRESS NOTE Subjective: Patient is alert, oriented, no Nausea, no Vomiting, yes passing gas. Taking PO well. Denies SOB, Chest or Calf Pain. Using Incentive Spirometer, PAS in place. Ambulate WBAT with pt walking 70 ft with therapy, CPM 0-40 Patient reports pain as 3-4/10 .    Objective: Vital signs in last 24 hours: Filed Vitals:   06/03/15 2228 06/03/15 2254 06/03/15 2330 06/04/15 0602  BP: 128/55   132/66  Pulse: 108   91  Temp: 101.8 F (38.8 C)  100.1 F (37.8 C) 98.1 F (36.7 C)  TempSrc:   Oral Oral  Resp:      Height:      Weight:      SpO2: 88% 97%  90%      Intake/Output from previous day: I/O last 3 completed shifts: In: 3809.6 [P.O.:720; I.V.:3089.6] Out: -    Intake/Output this shift:     LABORATORY DATA:  Recent Labs  06/02/15 0849  06/03/15 0333  06/03/15 1623 06/03/15 2230 06/04/15 0253 06/04/15 0606  WBC 7.0  --  12.2*  --   --   --  11.0*  --   HGB 12.4  --  11.2*  --   --   --  9.8*  --   HCT 39.3  --  35.0*  --   --   --  30.6*  --   PLT 235  --  256  --   --   --  202  --   NA 140  --  134*  --   --   --   --   --   K 3.9  --  3.9  --   --   --   --   --   CL 107  --  96*  --   --   --   --   --   CO2 25  --  28  --   --   --   --   --   BUN 16  --  5*  --   --   --   --   --   CREATININE 0.67  --  0.56  --   --   --   --   --   GLUCOSE 137*  --  198*  --   --   --   --   --   GLUCAP  --   < >  --   < > 171* 207*  --  166*  INR 1.20  --   --   --   --   --   --   --   CALCIUM 9.6  --  9.0  --   --   --   --   --   < > = values in this interval not displayed.  Examination: Neurologically intact Neurovascular intact Sensation intact distally Intact pulses distally Dorsiflexion/Plantar flexion intact Incision: dressing C/D/I No cellulitis present Compartment soft}  Assessment:   2  Days Post-Op Procedure(s) (LRB): LEFT TOTAL KNEE ARTHROPLASTY (Left) ADDITIONAL DIAGNOSIS: Expected Acute Blood Loss Anemia, Diabetes and Hypertension, history of coronary artery bypass surgery stable   Plan: PT/OT WBAT, CPM 5/hrs day until ROM 0-90 degrees, then D/C CPM DVT Prophylaxis:  SCDx72hrs, ASA 325 mg BID x 2 weeks DISCHARGE PLAN: Home, when she passes  her therapy goals. DISCHARGE NEEDS: HHPT, CPM, Walker and 3-in-1 comode seat     Min Collymore R 06/04/2015, 7:48 AM

## 2015-06-04 NOTE — Progress Notes (Signed)
Physical Therapy Treatment Patient Details Name: Mary [name] MRN: [id number] DOB: [birth year] Today's Date: 06/04/2015    History of Present Illness Pt is a 75 y.o. female now s/p Lt TKA. PMH: diabetes, CAD, CABG, back surgery, Rt TKA.     PT Comments    Pt making progress with PT regarding mobility, ambulating 100 ft with rw. Reviewing HEP with pt and daughter. Will f/u to perform stairs at next session.   Follow Up Recommendations  Home health PT;Supervision for mobility/OOB     Equipment Recommendations  None recommended by PT    Recommendations for Other Services       Precautions / Restrictions Precautions Precautions: Knee;Fall Restrictions Weight Bearing Restrictions: Yes LLE Weight Bearing: Weight bearing as tolerated    Mobility  Bed Mobility Overal bed mobility: Needs Assistance Bed Mobility: Supine to Sit     Supine to sit: Min assist     General bed mobility comments: min assist with LLE  Transfers Overall transfer level: Needs assistance Equipment used: Rolling walker (2 wheeled) Transfers: Sit to/from Stand Sit to Stand: Min guard         General transfer comment: no cues needed for hand position. Assist needed to control desent into chair. Encouraging knee flexion.  Ambulation/Gait Ambulation/Gait assistance: Min guard Ambulation Distance (Feet): 100 Feet Assistive device: Rolling walker (2 wheeled) Gait Pattern/deviations: Step-to pattern;Step-through pattern Gait velocity: decreased   General Gait Details: Encouraging even strides and even weight shift onto LLE.    Stairs            Wheelchair Mobility    Modified Rankin (Stroke Patients Only)       Balance Overall balance assessment: Needs assistance Sitting-balance support: No upper extremity supported Sitting balance-Leahy Scale: Good     Standing balance support: Bilateral upper extremity supported Standing balance-Leahy Scale: Poor Standing balance comment: using  rw for support                    Cognition Arousal/Alertness: Awake/alert Behavior During Therapy: WFL for tasks assessed/performed Overall Cognitive Status: Within Functional Limits for tasks assessed                      Exercises Total Joint Exercises Ankle Circles/Pumps: AROM;Both;10 reps Quad Sets: Strengthening;Left;10 reps Towel Squeeze: Strengthening;Both;10 reps Short Arc Quad: Strengthening;Left;10 reps (min assist) Heel Slides: AAROM;Left;10 reps Hip ABduction/ADduction: Strengthening;Left;10 reps Straight Leg Raises: Strengthening;Left;10 reps (mod assist) Goniometric ROM: 78 degrees flexion    General Comments        Pertinent Vitals/Pain Pain Assessment: 0-10 Pain Score: 3  Pain Location: Lt knee  Pain Descriptors / Indicators: Sore Pain Intervention(s): Limited activity within patient's tolerance;Monitored during session    Home Living                      Prior Function            PT Goals (current goals can now be found in the care plan section) Acute Rehab PT Goals Patient Stated Goal: go home today PT Goal Formulation: With patient Time For Goal Achievement: 06/17/15 Potential to Achieve Goals: Good Progress towards PT goals: Progressing toward goals    Frequency  7X/week    PT Plan Current plan remains appropriate    Co-evaluation             End of Session Equipment Utilized During Treatment: Gait belt Activity Tolerance: Patient tolerated treatment well Patient left: in chair;with  call bell/phone within reach;with family/visitor present (in knee extension)     Time: 1610-9604 PT Time Calculation (min) (ACUTE ONLY): 29 min  Charges:  $Gait Training: 8-22 mins $Therapeutic Exercise: 8-22 mins                    G Codes:      Christiane Ha, PT, CSCS Pager [phone] Office [phone]  06/04/2015, 9:00 AM

## 2015-06-04 NOTE — Care Management Important Message (Signed)
Important Message  Patient Details  Name: Mary [name] MRN: [id number] Date of Birth: [birth year]   Medicare Important Message Given:  Yes    Bernadette HoitShoffner, Tyjuan Demetro Coleman 06/04/2015, 11:27 AM

## 2015-06-05 DIAGNOSIS — E119 Type 2 diabetes mellitus without complications: Secondary | ICD-10-CM | POA: Diagnosis not present

## 2015-06-05 DIAGNOSIS — Z79891 Long term (current) use of opiate analgesic: Secondary | ICD-10-CM | POA: Diagnosis not present

## 2015-06-05 DIAGNOSIS — Z7982 Long term (current) use of aspirin: Secondary | ICD-10-CM | POA: Diagnosis not present

## 2015-06-05 DIAGNOSIS — Z471 Aftercare following joint replacement surgery: Secondary | ICD-10-CM | POA: Diagnosis not present

## 2015-06-05 DIAGNOSIS — Z96652 Presence of left artificial knee joint: Secondary | ICD-10-CM | POA: Diagnosis not present

## 2015-06-05 NOTE — Anesthesia Postprocedure Evaluation (Signed)
Anesthesia Post Note  Patient: Mary [name]  Procedure(s) Performed: Procedure(s) (LRB): LEFT TOTAL KNEE ARTHROPLASTY (Left)  Patient location during evaluation: PACU Anesthesia Type: Spinal Level of consciousness: awake Pain management: satisfactory to patient Vital Signs Assessment: post-procedure vital signs reviewed and stable Respiratory status: spontaneous breathing Cardiovascular status: blood pressure returned to baseline Postop Assessment: no headache and spinal receding Anesthetic complications: no     Last Vitals:  Filed Vitals:   06/03/15 2330 06/04/15 0602  BP:  132/66  Pulse:  91  Temp: 37.8 C 36.7 C  Resp:      Last Pain:  Filed Vitals:   06/04/15 0714  PainSc: 8    Pain Goal: Patients Stated Pain Goal: 3 (06/04/15 0709)               Cristela BlueJACKSON,Lemoine Goyne EDWARD

## 2015-06-06 DIAGNOSIS — Z96652 Presence of left artificial knee joint: Secondary | ICD-10-CM | POA: Diagnosis not present

## 2015-06-06 DIAGNOSIS — Z79891 Long term (current) use of opiate analgesic: Secondary | ICD-10-CM | POA: Diagnosis not present

## 2015-06-06 DIAGNOSIS — Z7982 Long term (current) use of aspirin: Secondary | ICD-10-CM | POA: Diagnosis not present

## 2015-06-06 DIAGNOSIS — Z471 Aftercare following joint replacement surgery: Secondary | ICD-10-CM | POA: Diagnosis not present

## 2015-06-06 DIAGNOSIS — E119 Type 2 diabetes mellitus without complications: Secondary | ICD-10-CM | POA: Diagnosis not present

## 2015-06-07 DIAGNOSIS — E119 Type 2 diabetes mellitus without complications: Secondary | ICD-10-CM | POA: Diagnosis not present

## 2015-06-07 DIAGNOSIS — Z471 Aftercare following joint replacement surgery: Secondary | ICD-10-CM | POA: Diagnosis not present

## 2015-06-07 DIAGNOSIS — Z96652 Presence of left artificial knee joint: Secondary | ICD-10-CM | POA: Diagnosis not present

## 2015-06-07 DIAGNOSIS — Z79891 Long term (current) use of opiate analgesic: Secondary | ICD-10-CM | POA: Diagnosis not present

## 2015-06-07 DIAGNOSIS — Z7982 Long term (current) use of aspirin: Secondary | ICD-10-CM | POA: Diagnosis not present

## 2015-06-09 DIAGNOSIS — E119 Type 2 diabetes mellitus without complications: Secondary | ICD-10-CM | POA: Diagnosis not present

## 2015-06-09 DIAGNOSIS — Z7982 Long term (current) use of aspirin: Secondary | ICD-10-CM | POA: Diagnosis not present

## 2015-06-09 DIAGNOSIS — Z471 Aftercare following joint replacement surgery: Secondary | ICD-10-CM | POA: Diagnosis not present

## 2015-06-09 DIAGNOSIS — Z96652 Presence of left artificial knee joint: Secondary | ICD-10-CM | POA: Diagnosis not present

## 2015-06-09 DIAGNOSIS — Z79891 Long term (current) use of opiate analgesic: Secondary | ICD-10-CM | POA: Diagnosis not present

## 2015-06-11 DIAGNOSIS — Z96652 Presence of left artificial knee joint: Secondary | ICD-10-CM | POA: Diagnosis not present

## 2015-06-11 DIAGNOSIS — Z471 Aftercare following joint replacement surgery: Secondary | ICD-10-CM | POA: Diagnosis not present

## 2015-06-11 DIAGNOSIS — Z7982 Long term (current) use of aspirin: Secondary | ICD-10-CM | POA: Diagnosis not present

## 2015-06-11 DIAGNOSIS — E119 Type 2 diabetes mellitus without complications: Secondary | ICD-10-CM | POA: Diagnosis not present

## 2015-06-11 DIAGNOSIS — Z79891 Long term (current) use of opiate analgesic: Secondary | ICD-10-CM | POA: Diagnosis not present

## 2015-06-12 DIAGNOSIS — I10 Essential (primary) hypertension: Secondary | ICD-10-CM | POA: Diagnosis not present

## 2015-06-12 DIAGNOSIS — Z96659 Presence of unspecified artificial knee joint: Secondary | ICD-10-CM | POA: Diagnosis not present

## 2015-06-12 DIAGNOSIS — Z6826 Body mass index (BMI) 26.0-26.9, adult: Secondary | ICD-10-CM | POA: Diagnosis not present

## 2015-06-13 DIAGNOSIS — E119 Type 2 diabetes mellitus without complications: Secondary | ICD-10-CM | POA: Diagnosis not present

## 2015-06-13 DIAGNOSIS — Z79891 Long term (current) use of opiate analgesic: Secondary | ICD-10-CM | POA: Diagnosis not present

## 2015-06-13 DIAGNOSIS — Z7982 Long term (current) use of aspirin: Secondary | ICD-10-CM | POA: Diagnosis not present

## 2015-06-13 DIAGNOSIS — Z96652 Presence of left artificial knee joint: Secondary | ICD-10-CM | POA: Diagnosis not present

## 2015-06-13 DIAGNOSIS — Z471 Aftercare following joint replacement surgery: Secondary | ICD-10-CM | POA: Diagnosis not present

## 2015-06-17 DIAGNOSIS — M1712 Unilateral primary osteoarthritis, left knee: Secondary | ICD-10-CM | POA: Diagnosis not present

## 2015-06-24 DIAGNOSIS — Z7984 Long term (current) use of oral hypoglycemic drugs: Secondary | ICD-10-CM | POA: Diagnosis not present

## 2015-06-24 DIAGNOSIS — E785 Hyperlipidemia, unspecified: Secondary | ICD-10-CM | POA: Diagnosis not present

## 2015-06-24 DIAGNOSIS — Z6826 Body mass index (BMI) 26.0-26.9, adult: Secondary | ICD-10-CM | POA: Diagnosis not present

## 2015-06-24 DIAGNOSIS — Z7982 Long term (current) use of aspirin: Secondary | ICD-10-CM | POA: Diagnosis not present

## 2015-06-24 DIAGNOSIS — Z79899 Other long term (current) drug therapy: Secondary | ICD-10-CM | POA: Diagnosis not present

## 2015-06-24 DIAGNOSIS — I200 Unstable angina: Secondary | ICD-10-CM | POA: Diagnosis not present

## 2015-06-24 DIAGNOSIS — Z96652 Presence of left artificial knee joint: Secondary | ICD-10-CM | POA: Diagnosis present

## 2015-06-24 DIAGNOSIS — I110 Hypertensive heart disease with heart failure: Secondary | ICD-10-CM | POA: Diagnosis not present

## 2015-06-24 DIAGNOSIS — I10 Essential (primary) hypertension: Secondary | ICD-10-CM | POA: Diagnosis not present

## 2015-06-24 DIAGNOSIS — I119 Hypertensive heart disease without heart failure: Secondary | ICD-10-CM | POA: Diagnosis not present

## 2015-06-24 DIAGNOSIS — E784 Other hyperlipidemia: Secondary | ICD-10-CM | POA: Diagnosis not present

## 2015-06-24 DIAGNOSIS — I509 Heart failure, unspecified: Secondary | ICD-10-CM | POA: Diagnosis not present

## 2015-06-24 DIAGNOSIS — R0789 Other chest pain: Secondary | ICD-10-CM | POA: Diagnosis not present

## 2015-06-24 DIAGNOSIS — E119 Type 2 diabetes mellitus without complications: Secondary | ICD-10-CM | POA: Diagnosis not present

## 2015-06-24 DIAGNOSIS — Z951 Presence of aortocoronary bypass graft: Secondary | ICD-10-CM | POA: Diagnosis not present

## 2015-06-24 DIAGNOSIS — R0602 Shortness of breath: Secondary | ICD-10-CM | POA: Diagnosis not present

## 2015-06-24 DIAGNOSIS — R071 Chest pain on breathing: Secondary | ICD-10-CM | POA: Diagnosis not present

## 2015-06-24 DIAGNOSIS — R079 Chest pain, unspecified: Secondary | ICD-10-CM | POA: Diagnosis not present

## 2015-06-24 DIAGNOSIS — Z87891 Personal history of nicotine dependence: Secondary | ICD-10-CM | POA: Diagnosis not present

## 2015-06-24 DIAGNOSIS — I25110 Atherosclerotic heart disease of native coronary artery with unstable angina pectoris: Secondary | ICD-10-CM | POA: Diagnosis not present

## 2015-06-24 DIAGNOSIS — I2510 Atherosclerotic heart disease of native coronary artery without angina pectoris: Secondary | ICD-10-CM | POA: Diagnosis not present

## 2015-07-16 DIAGNOSIS — I10 Essential (primary) hypertension: Secondary | ICD-10-CM | POA: Diagnosis not present

## 2015-07-16 DIAGNOSIS — E1159 Type 2 diabetes mellitus with other circulatory complications: Secondary | ICD-10-CM | POA: Diagnosis not present

## 2015-07-16 DIAGNOSIS — Z96652 Presence of left artificial knee joint: Secondary | ICD-10-CM | POA: Diagnosis not present

## 2015-07-16 DIAGNOSIS — I25119 Atherosclerotic heart disease of native coronary artery with unspecified angina pectoris: Secondary | ICD-10-CM | POA: Diagnosis not present

## 2015-07-28 DIAGNOSIS — E118 Type 2 diabetes mellitus with unspecified complications: Secondary | ICD-10-CM | POA: Diagnosis not present

## 2015-07-28 DIAGNOSIS — Z96652 Presence of left artificial knee joint: Secondary | ICD-10-CM | POA: Diagnosis not present

## 2015-07-28 DIAGNOSIS — Z6827 Body mass index (BMI) 27.0-27.9, adult: Secondary | ICD-10-CM | POA: Diagnosis not present

## 2015-07-28 DIAGNOSIS — Z951 Presence of aortocoronary bypass graft: Secondary | ICD-10-CM | POA: Diagnosis not present

## 2015-07-28 DIAGNOSIS — E785 Hyperlipidemia, unspecified: Secondary | ICD-10-CM | POA: Diagnosis not present

## 2015-07-28 DIAGNOSIS — I2510 Atherosclerotic heart disease of native coronary artery without angina pectoris: Secondary | ICD-10-CM | POA: Diagnosis not present

## 2015-07-28 DIAGNOSIS — I10 Essential (primary) hypertension: Secondary | ICD-10-CM | POA: Diagnosis not present

## 2015-07-28 DIAGNOSIS — E1159 Type 2 diabetes mellitus with other circulatory complications: Secondary | ICD-10-CM | POA: Diagnosis not present

## 2016-03-01 DIAGNOSIS — E1159 Type 2 diabetes mellitus with other circulatory complications: Secondary | ICD-10-CM | POA: Diagnosis not present

## 2016-03-01 DIAGNOSIS — Z1389 Encounter for screening for other disorder: Secondary | ICD-10-CM | POA: Diagnosis not present

## 2016-03-01 DIAGNOSIS — Z139 Encounter for screening, unspecified: Secondary | ICD-10-CM | POA: Diagnosis not present

## 2016-03-01 DIAGNOSIS — E785 Hyperlipidemia, unspecified: Secondary | ICD-10-CM | POA: Diagnosis not present

## 2016-03-01 DIAGNOSIS — I10 Essential (primary) hypertension: Secondary | ICD-10-CM | POA: Diagnosis not present

## 2016-03-01 DIAGNOSIS — Z0000 Encounter for general adult medical examination without abnormal findings: Secondary | ICD-10-CM | POA: Diagnosis not present

## 2016-03-03 DIAGNOSIS — Z951 Presence of aortocoronary bypass graft: Secondary | ICD-10-CM | POA: Diagnosis not present

## 2016-03-03 DIAGNOSIS — I10 Essential (primary) hypertension: Secondary | ICD-10-CM | POA: Diagnosis not present

## 2016-03-03 DIAGNOSIS — I2510 Atherosclerotic heart disease of native coronary artery without angina pectoris: Secondary | ICD-10-CM | POA: Diagnosis not present

## 2016-03-03 DIAGNOSIS — E785 Hyperlipidemia, unspecified: Secondary | ICD-10-CM | POA: Diagnosis not present

## 2016-03-03 DIAGNOSIS — E118 Type 2 diabetes mellitus with unspecified complications: Secondary | ICD-10-CM | POA: Diagnosis not present

## 2016-03-09 DIAGNOSIS — Z1231 Encounter for screening mammogram for malignant neoplasm of breast: Secondary | ICD-10-CM | POA: Diagnosis not present

## 2016-03-12 DIAGNOSIS — Z6826 Body mass index (BMI) 26.0-26.9, adult: Secondary | ICD-10-CM | POA: Diagnosis not present

## 2016-03-12 DIAGNOSIS — I10 Essential (primary) hypertension: Secondary | ICD-10-CM | POA: Diagnosis not present

## 2016-03-15 DIAGNOSIS — E1159 Type 2 diabetes mellitus with other circulatory complications: Secondary | ICD-10-CM | POA: Diagnosis not present

## 2016-03-15 DIAGNOSIS — I208 Other forms of angina pectoris: Secondary | ICD-10-CM | POA: Diagnosis not present

## 2016-03-15 DIAGNOSIS — E785 Hyperlipidemia, unspecified: Secondary | ICD-10-CM | POA: Diagnosis not present

## 2016-03-15 DIAGNOSIS — I10 Essential (primary) hypertension: Secondary | ICD-10-CM | POA: Diagnosis not present

## 2016-03-22 DIAGNOSIS — R079 Chest pain, unspecified: Secondary | ICD-10-CM | POA: Diagnosis not present

## 2016-03-23 DIAGNOSIS — R079 Chest pain, unspecified: Secondary | ICD-10-CM | POA: Diagnosis not present

## 2016-03-30 DIAGNOSIS — R079 Chest pain, unspecified: Secondary | ICD-10-CM | POA: Diagnosis not present

## 2016-03-31 DIAGNOSIS — I10 Essential (primary) hypertension: Secondary | ICD-10-CM | POA: Diagnosis not present

## 2016-03-31 DIAGNOSIS — Z951 Presence of aortocoronary bypass graft: Secondary | ICD-10-CM | POA: Diagnosis not present

## 2016-03-31 DIAGNOSIS — E785 Hyperlipidemia, unspecified: Secondary | ICD-10-CM | POA: Diagnosis not present

## 2016-03-31 DIAGNOSIS — E118 Type 2 diabetes mellitus with unspecified complications: Secondary | ICD-10-CM | POA: Diagnosis not present

## 2016-03-31 DIAGNOSIS — I2510 Atherosclerotic heart disease of native coronary artery without angina pectoris: Secondary | ICD-10-CM | POA: Diagnosis not present

## 2016-03-31 DIAGNOSIS — R9439 Abnormal result of other cardiovascular function study: Secondary | ICD-10-CM | POA: Diagnosis not present

## 2016-04-02 DIAGNOSIS — E785 Hyperlipidemia, unspecified: Secondary | ICD-10-CM | POA: Diagnosis not present

## 2016-04-02 DIAGNOSIS — I10 Essential (primary) hypertension: Secondary | ICD-10-CM | POA: Diagnosis not present

## 2016-04-02 DIAGNOSIS — Z951 Presence of aortocoronary bypass graft: Secondary | ICD-10-CM | POA: Diagnosis not present

## 2016-04-02 DIAGNOSIS — R9439 Abnormal result of other cardiovascular function study: Secondary | ICD-10-CM | POA: Diagnosis not present

## 2016-04-02 DIAGNOSIS — E118 Type 2 diabetes mellitus with unspecified complications: Secondary | ICD-10-CM | POA: Diagnosis not present

## 2016-04-08 DIAGNOSIS — I77819 Aortic ectasia, unspecified site: Secondary | ICD-10-CM | POA: Diagnosis not present

## 2016-04-08 DIAGNOSIS — E119 Type 2 diabetes mellitus without complications: Secondary | ICD-10-CM | POA: Diagnosis not present

## 2016-04-08 DIAGNOSIS — I700 Atherosclerosis of aorta: Secondary | ICD-10-CM | POA: Diagnosis not present

## 2016-04-08 DIAGNOSIS — I25119 Atherosclerotic heart disease of native coronary artery with unspecified angina pectoris: Secondary | ICD-10-CM | POA: Diagnosis not present

## 2016-04-08 DIAGNOSIS — I25118 Atherosclerotic heart disease of native coronary artery with other forms of angina pectoris: Secondary | ICD-10-CM | POA: Diagnosis not present

## 2016-04-08 DIAGNOSIS — I517 Cardiomegaly: Secondary | ICD-10-CM | POA: Diagnosis not present

## 2016-04-08 DIAGNOSIS — I340 Nonrheumatic mitral (valve) insufficiency: Secondary | ICD-10-CM | POA: Diagnosis not present

## 2016-04-08 DIAGNOSIS — R9439 Abnormal result of other cardiovascular function study: Secondary | ICD-10-CM | POA: Diagnosis not present

## 2016-04-08 DIAGNOSIS — Z79899 Other long term (current) drug therapy: Secondary | ICD-10-CM | POA: Diagnosis not present

## 2016-04-08 DIAGNOSIS — I10 Essential (primary) hypertension: Secondary | ICD-10-CM | POA: Diagnosis not present

## 2016-04-08 DIAGNOSIS — Z951 Presence of aortocoronary bypass graft: Secondary | ICD-10-CM | POA: Diagnosis not present

## 2016-04-08 DIAGNOSIS — Z7902 Long term (current) use of antithrombotics/antiplatelets: Secondary | ICD-10-CM | POA: Diagnosis not present

## 2016-04-09 DIAGNOSIS — E119 Type 2 diabetes mellitus without complications: Secondary | ICD-10-CM | POA: Diagnosis not present

## 2016-04-09 DIAGNOSIS — Z951 Presence of aortocoronary bypass graft: Secondary | ICD-10-CM | POA: Diagnosis not present

## 2016-04-09 DIAGNOSIS — I2510 Atherosclerotic heart disease of native coronary artery without angina pectoris: Secondary | ICD-10-CM | POA: Diagnosis not present

## 2016-04-09 DIAGNOSIS — Z7902 Long term (current) use of antithrombotics/antiplatelets: Secondary | ICD-10-CM | POA: Diagnosis not present

## 2016-04-09 DIAGNOSIS — R9439 Abnormal result of other cardiovascular function study: Secondary | ICD-10-CM | POA: Diagnosis not present

## 2016-04-09 DIAGNOSIS — I10 Essential (primary) hypertension: Secondary | ICD-10-CM | POA: Diagnosis not present

## 2016-04-09 DIAGNOSIS — I25119 Atherosclerotic heart disease of native coronary artery with unspecified angina pectoris: Secondary | ICD-10-CM | POA: Diagnosis not present

## 2016-04-15 DIAGNOSIS — M25511 Pain in right shoulder: Secondary | ICD-10-CM | POA: Diagnosis not present

## 2016-04-16 DIAGNOSIS — M6281 Muscle weakness (generalized): Secondary | ICD-10-CM | POA: Diagnosis not present

## 2016-04-16 DIAGNOSIS — M25511 Pain in right shoulder: Secondary | ICD-10-CM | POA: Diagnosis not present

## 2016-04-21 DIAGNOSIS — M6281 Muscle weakness (generalized): Secondary | ICD-10-CM | POA: Diagnosis not present

## 2016-04-21 DIAGNOSIS — M25511 Pain in right shoulder: Secondary | ICD-10-CM | POA: Diagnosis not present

## 2016-04-23 DIAGNOSIS — E785 Hyperlipidemia, unspecified: Secondary | ICD-10-CM | POA: Diagnosis not present

## 2016-04-23 DIAGNOSIS — I10 Essential (primary) hypertension: Secondary | ICD-10-CM | POA: Diagnosis not present

## 2016-04-23 DIAGNOSIS — I2510 Atherosclerotic heart disease of native coronary artery without angina pectoris: Secondary | ICD-10-CM | POA: Diagnosis not present

## 2016-04-23 DIAGNOSIS — E118 Type 2 diabetes mellitus with unspecified complications: Secondary | ICD-10-CM | POA: Diagnosis not present

## 2016-04-23 DIAGNOSIS — Z951 Presence of aortocoronary bypass graft: Secondary | ICD-10-CM | POA: Diagnosis not present

## 2016-04-27 DIAGNOSIS — M25511 Pain in right shoulder: Secondary | ICD-10-CM | POA: Diagnosis not present

## 2016-04-27 DIAGNOSIS — Z955 Presence of coronary angioplasty implant and graft: Secondary | ICD-10-CM | POA: Diagnosis not present

## 2016-04-27 DIAGNOSIS — M6281 Muscle weakness (generalized): Secondary | ICD-10-CM | POA: Diagnosis not present

## 2016-04-28 DIAGNOSIS — Z955 Presence of coronary angioplasty implant and graft: Secondary | ICD-10-CM | POA: Diagnosis not present

## 2016-04-30 DIAGNOSIS — Z955 Presence of coronary angioplasty implant and graft: Secondary | ICD-10-CM | POA: Diagnosis not present

## 2016-05-03 DIAGNOSIS — Z955 Presence of coronary angioplasty implant and graft: Secondary | ICD-10-CM | POA: Diagnosis not present

## 2016-05-05 DIAGNOSIS — Z955 Presence of coronary angioplasty implant and graft: Secondary | ICD-10-CM | POA: Diagnosis not present

## 2016-05-07 DIAGNOSIS — Z955 Presence of coronary angioplasty implant and graft: Secondary | ICD-10-CM | POA: Diagnosis not present

## 2016-05-10 DIAGNOSIS — Z955 Presence of coronary angioplasty implant and graft: Secondary | ICD-10-CM | POA: Diagnosis not present

## 2016-05-12 DIAGNOSIS — Z955 Presence of coronary angioplasty implant and graft: Secondary | ICD-10-CM | POA: Diagnosis not present

## 2016-05-14 DIAGNOSIS — Z951 Presence of aortocoronary bypass graft: Secondary | ICD-10-CM | POA: Diagnosis not present

## 2016-05-14 DIAGNOSIS — I209 Angina pectoris, unspecified: Secondary | ICD-10-CM | POA: Diagnosis not present

## 2016-05-14 DIAGNOSIS — I10 Essential (primary) hypertension: Secondary | ICD-10-CM | POA: Diagnosis not present

## 2016-05-14 DIAGNOSIS — E785 Hyperlipidemia, unspecified: Secondary | ICD-10-CM | POA: Diagnosis not present

## 2016-05-14 DIAGNOSIS — Z955 Presence of coronary angioplasty implant and graft: Secondary | ICD-10-CM | POA: Diagnosis not present

## 2016-05-14 DIAGNOSIS — E118 Type 2 diabetes mellitus with unspecified complications: Secondary | ICD-10-CM | POA: Diagnosis not present

## 2016-08-02 ENCOUNTER — Other Ambulatory Visit: Payer: Self-pay

## 2016-10-12 IMAGING — CR DG CHEST 2V
2 series · 2 of 2 positions shown · non-contrast
Comparison: None.

CLINICAL DATA: Preoperative evaluation

EXAM:
CHEST  2 VIEW

[w chest pa]
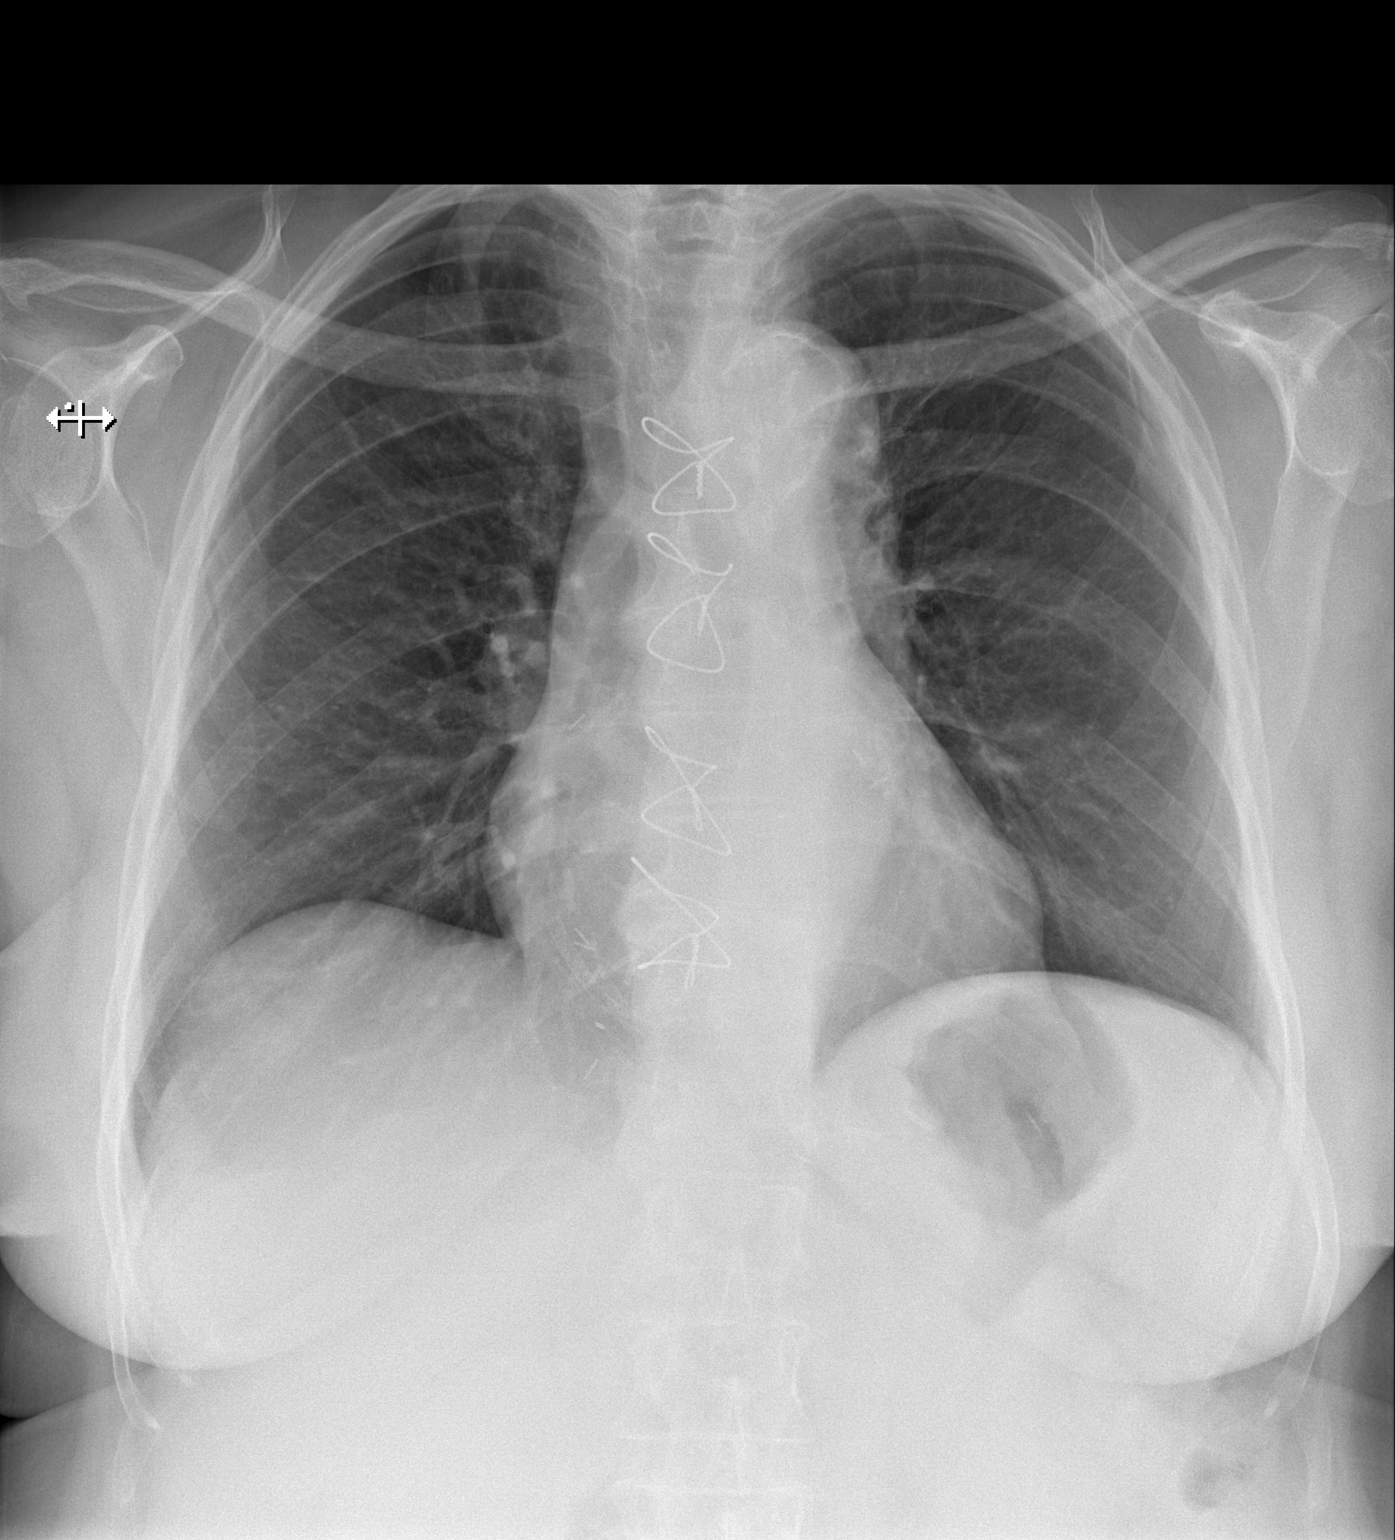

[w chest lat]
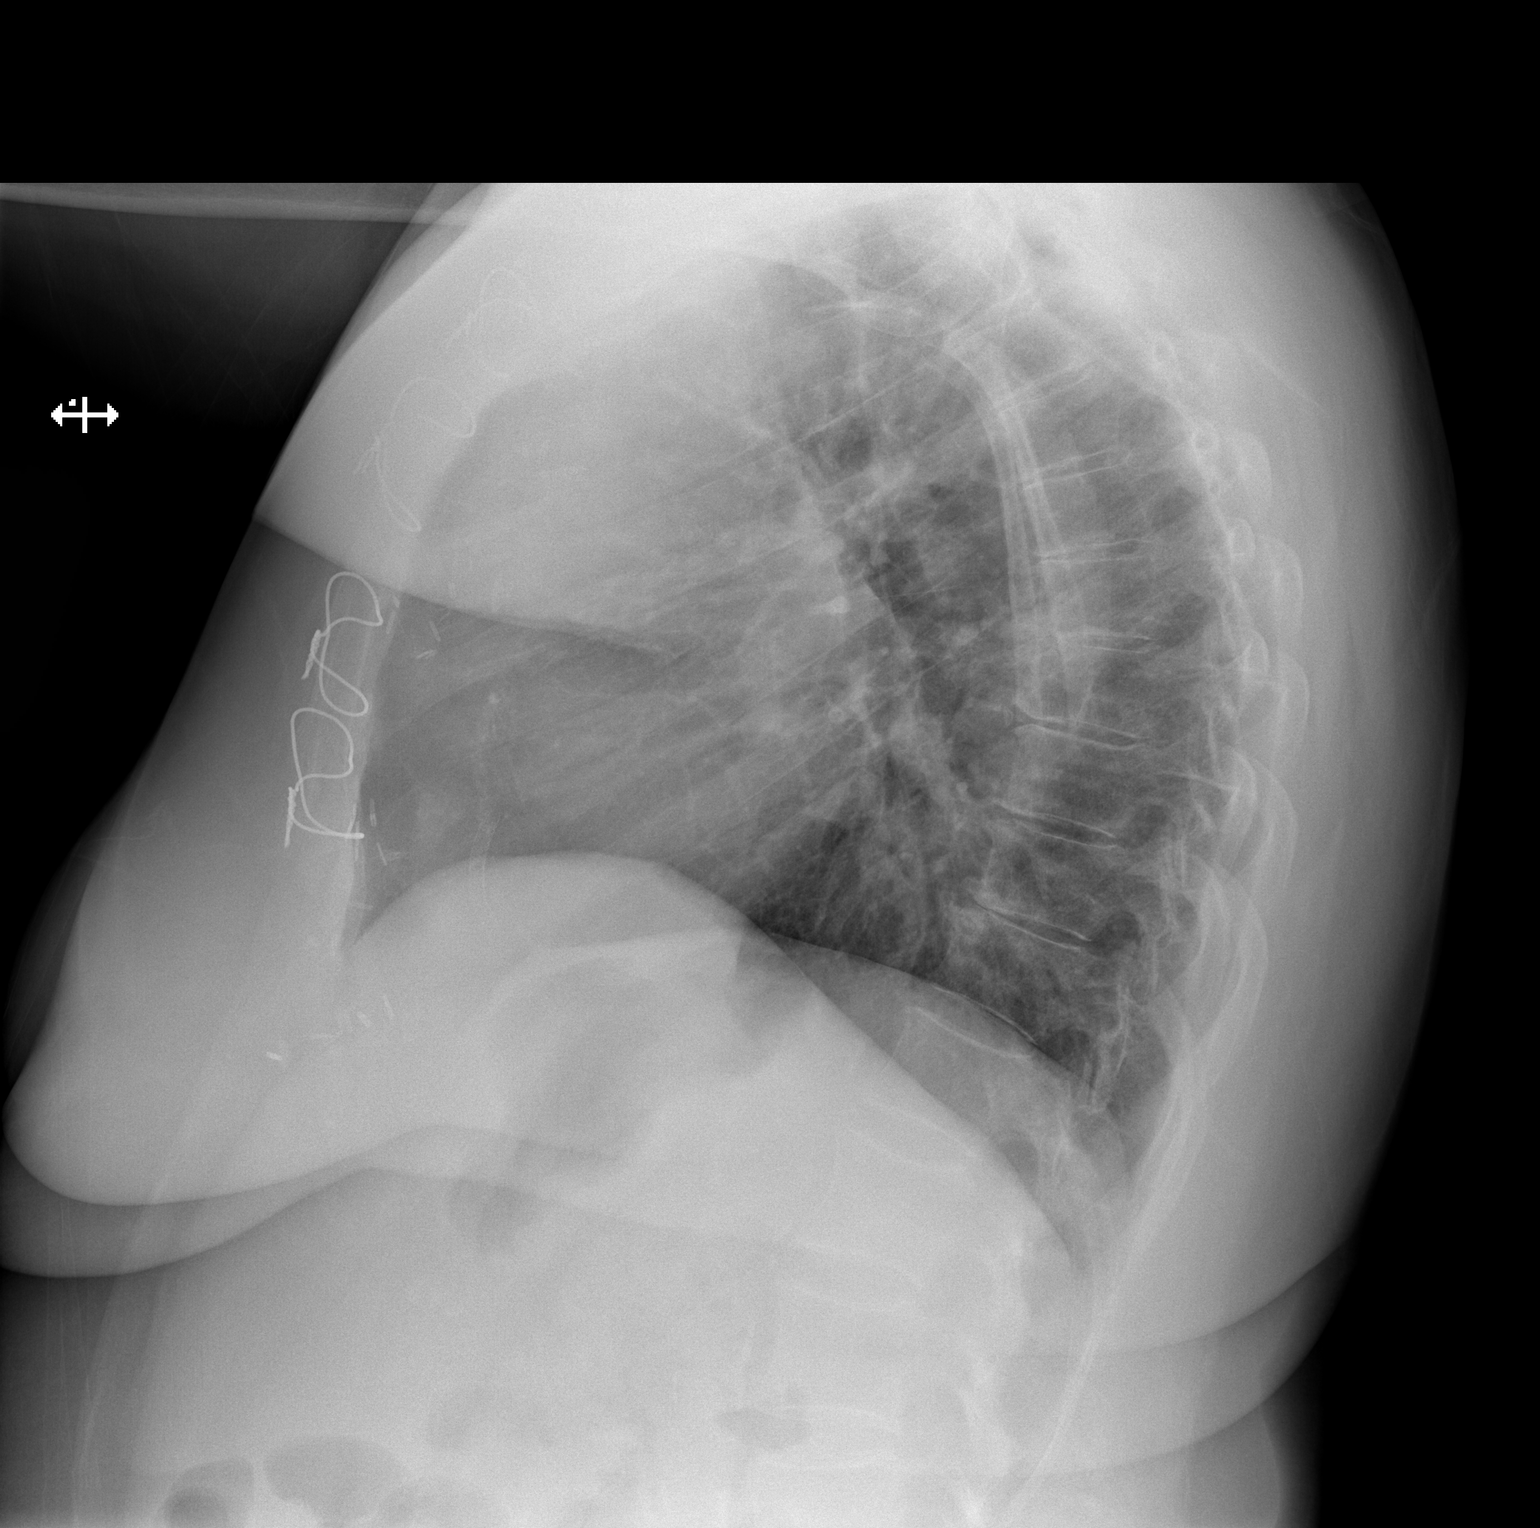

[2 of 2 positions shown; findings below may reference images not displayed]

FINDINGS: Cardiac shadow is mildly enlarged. Postsurgical changes are seen.
Coronary stent was noted. The lungs are clear bilaterally. No acute
bony abnormality is seen.
IMPRESSION: No acute abnormality noted.

## 2016-10-28 IMAGING — CR DG CHEST 1V PORT
1 series · 1 of 1 positions shown · non-contrast
Comparison: Chest radiograph 03/21/2014

CLINICAL DATA: Fever. Post right knee arthroplasty 5 days prior
04/01/2014.

EXAM:
PORTABLE CHEST - 1 VIEW

[AP]
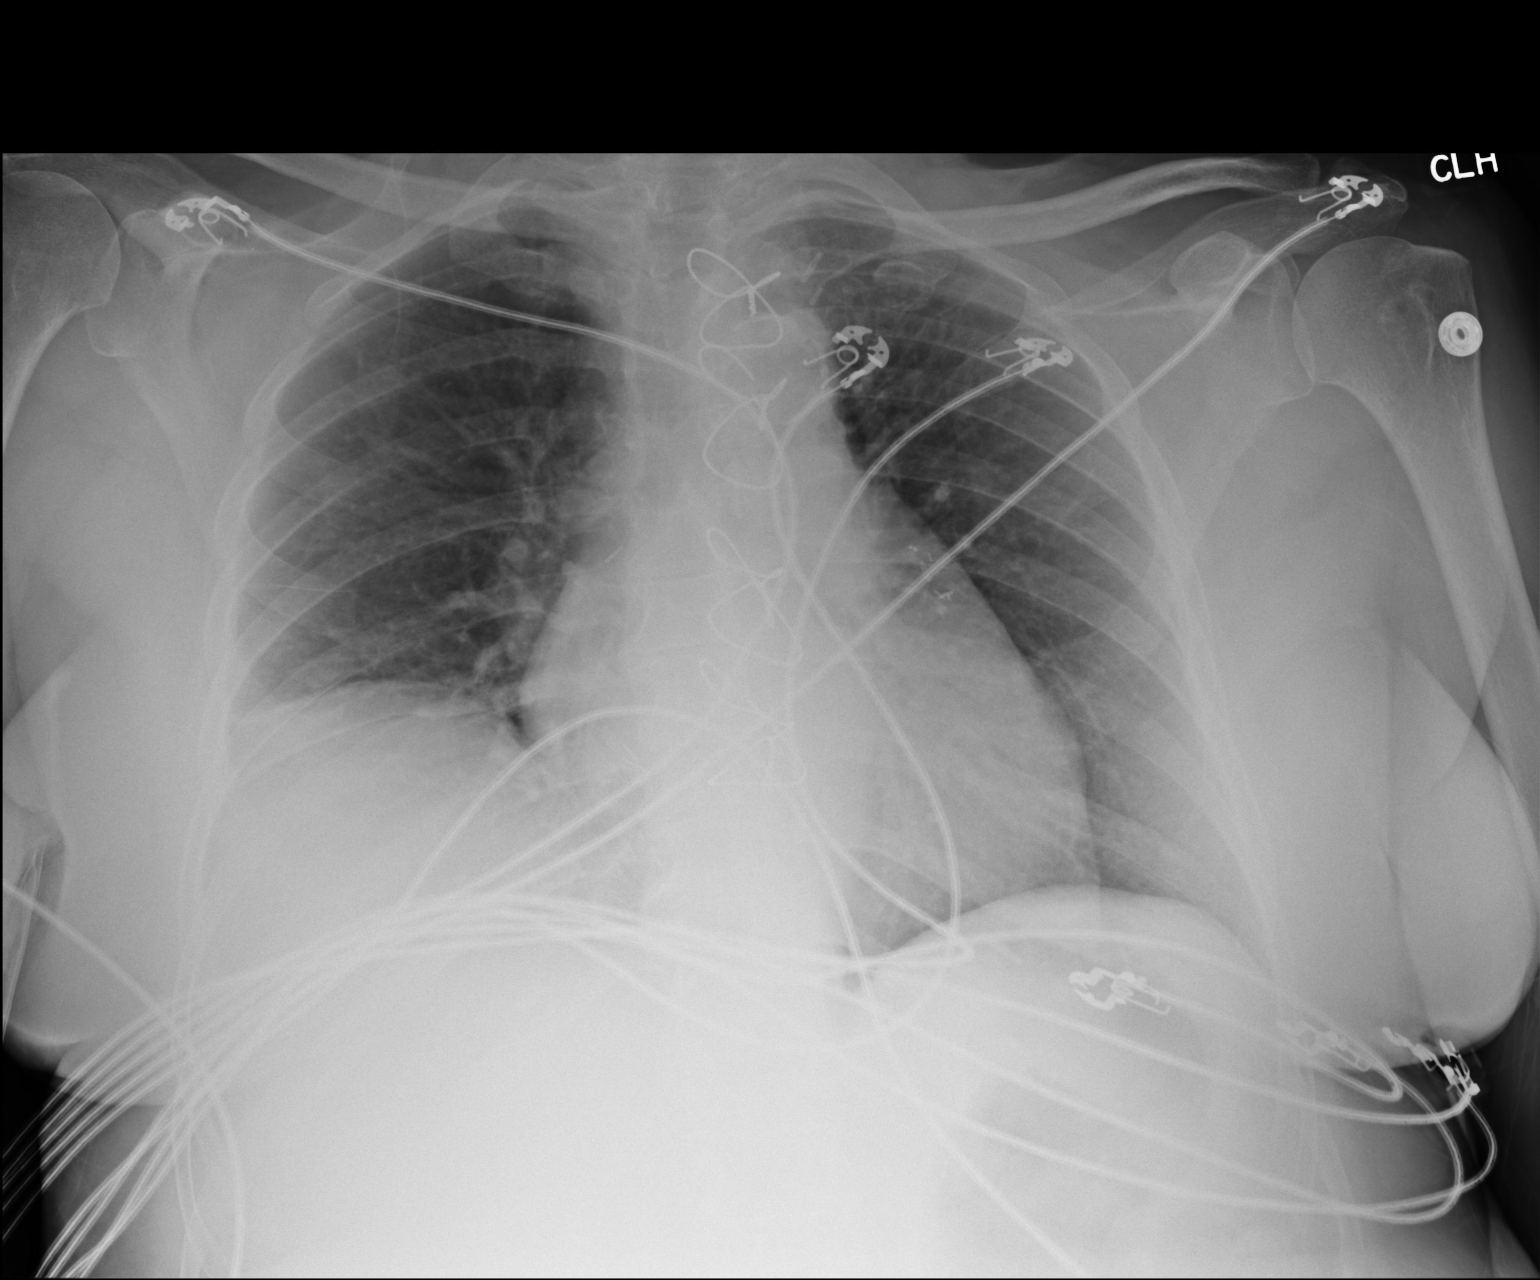

[1 of 1 positions shown; findings below may reference images not displayed]

FINDINGS: There is new elevation of right hemidiaphragm with opacity at the
right lung base. The left lung is clear. Patient is post median
sternotomy. Heart remains at the upper limits of normal in size.
Pulmonary vasculature is normal. No pneumothorax.
IMPRESSION: Volume loss in the right hemithorax with elevation of right
hemidiaphragm in opacity at the right lung base. This may reflect
atelectasis related to mucous plugging versus pneumonia.

## 2016-10-29 IMAGING — CR DG CHEST 2V
2 series · 2 of 2 positions shown · non-contrast
Comparison: 04/06/2014 and earlier.

CLINICAL DATA: 73-year-old female with shortness of breath today.
Healthcare associated pneumonia. Initial encounter.

EXAM:
CHEST  2 VIEW

[w chest pa]
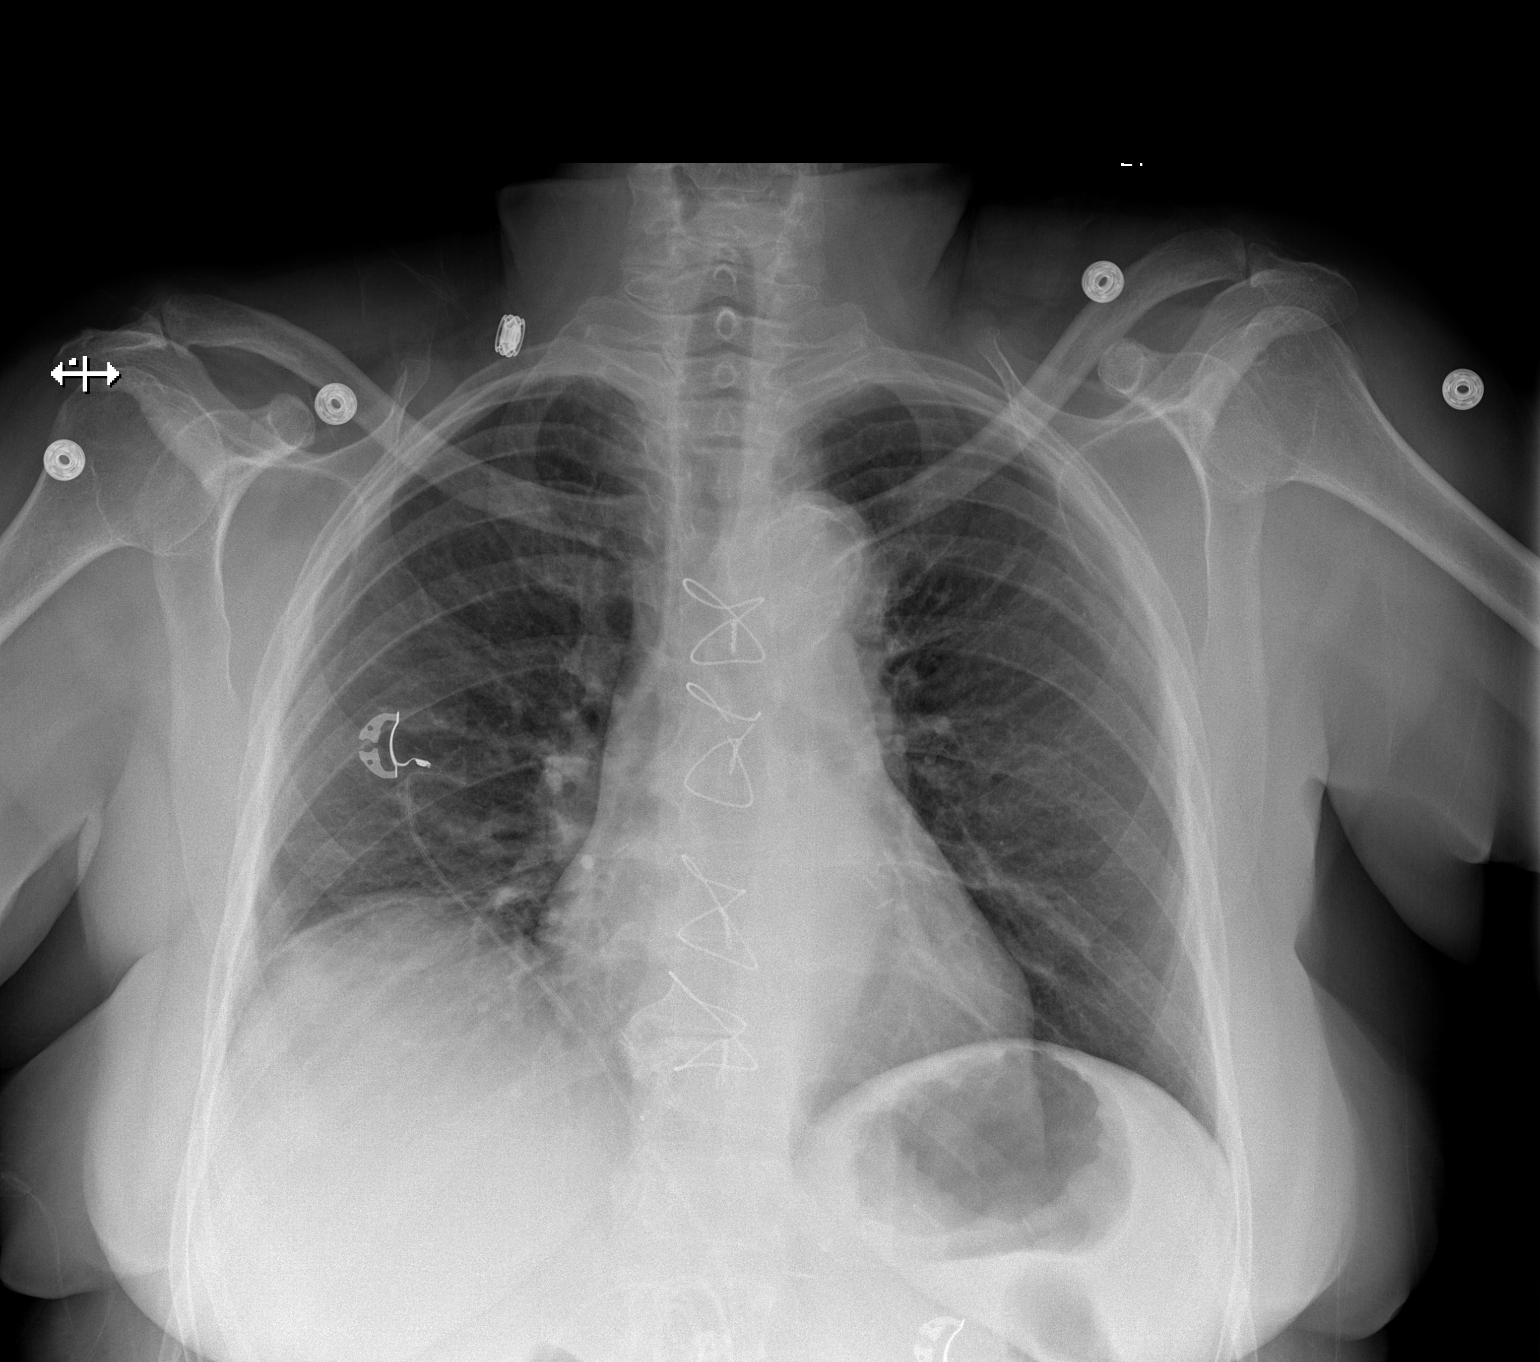

[w chest lat]
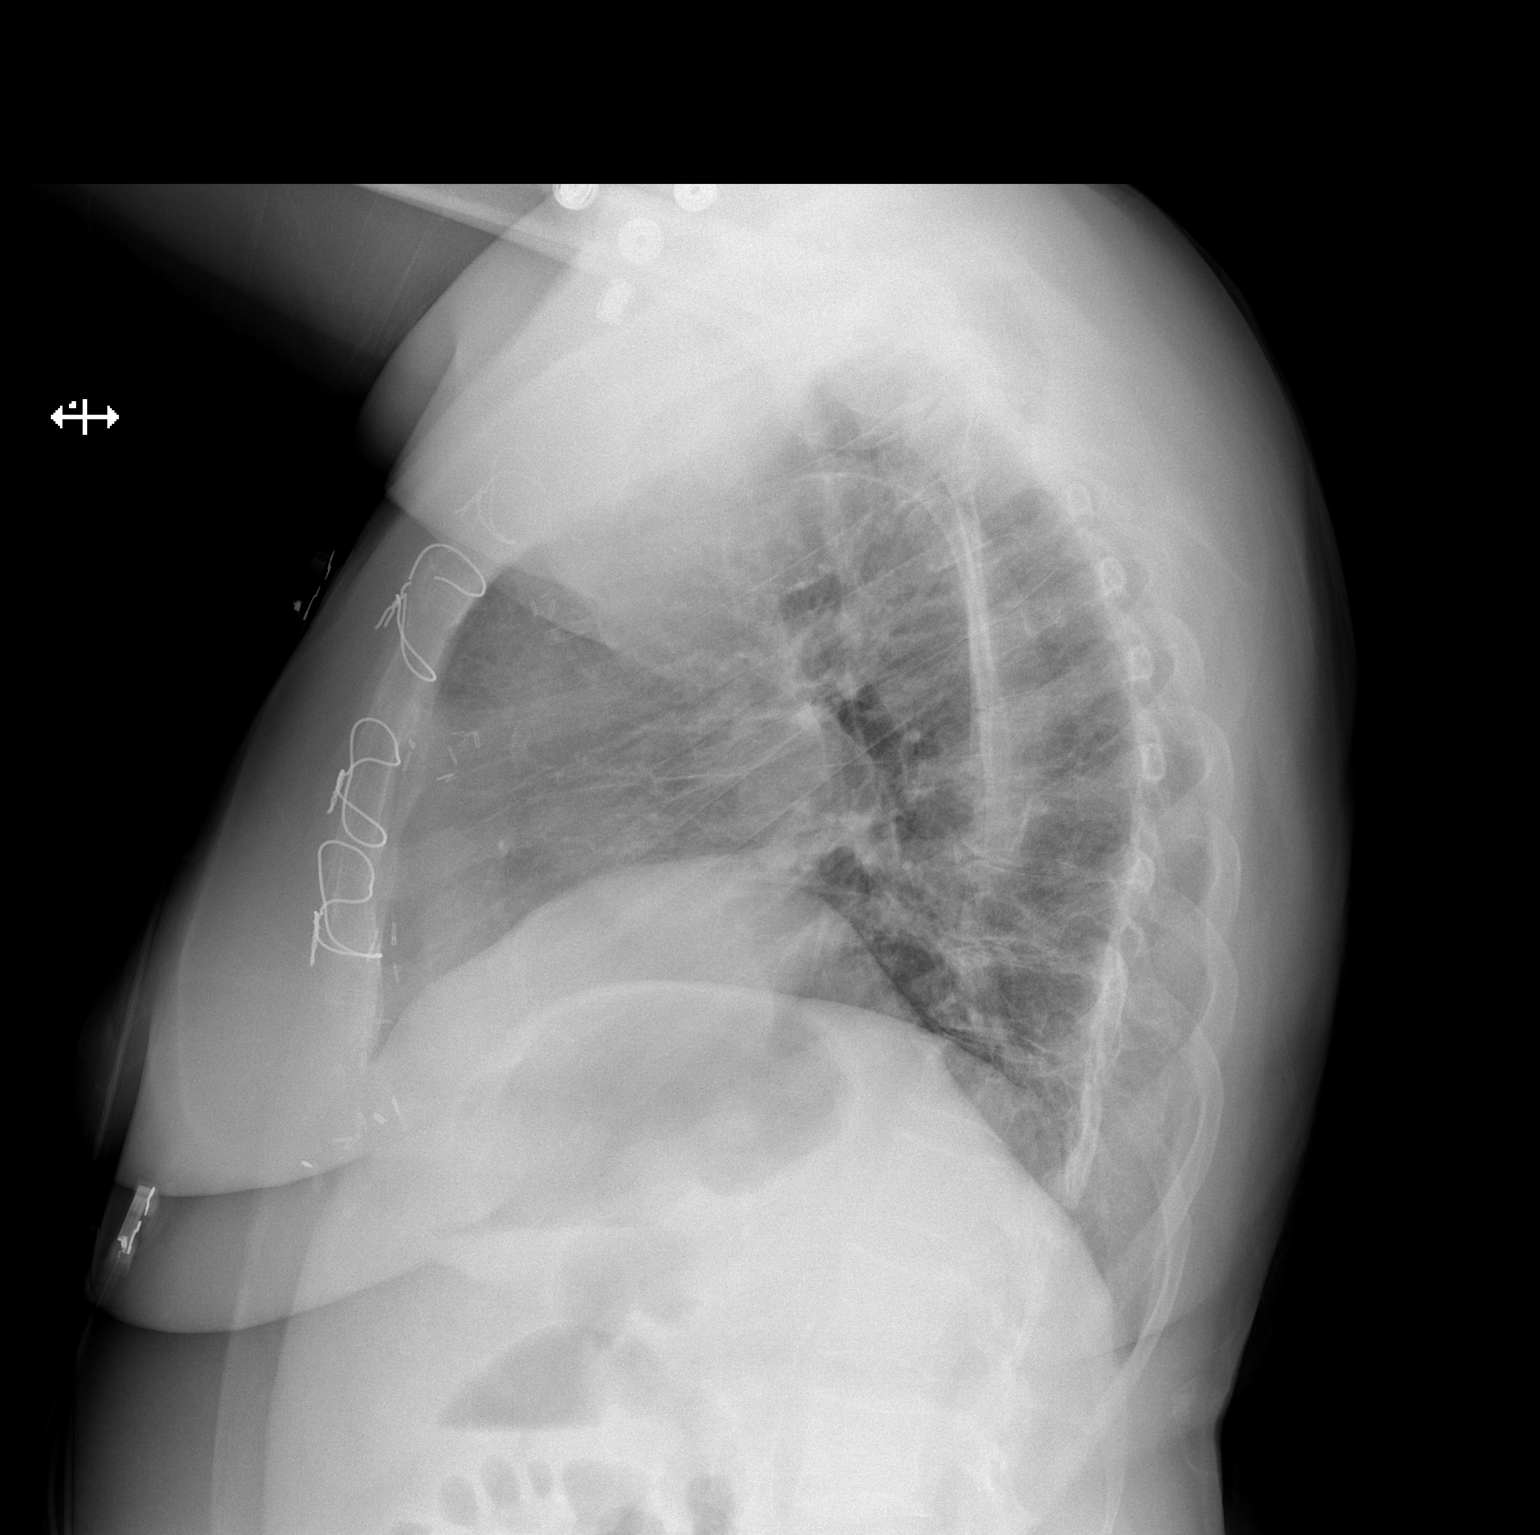

[2 of 2 positions shown; findings below may reference images not displayed]

FINDINGS: Mildly lower lung volumes overall but disproportionate lung volume
loss persists at the right lung base, new since 03/21/2014. No right
pleural effusion or consolidation is identified. Mild streaky right
lung base opacity primarily on the frontal view. The left lung
remains negative. No pneumothorax or pulmonary edema. Sequelae of
median sternotomy. Stable cardiac size and mediastinal contours.
Calcified atherosclerosis of the aorta. Visualized tracheal air
column is within normal limits. No acute osseous abnormality
identified.
IMPRESSION: Lower lung volumes than on 03/21/2014 but with disproportion volume
loss at the right lung base. This is nonspecific but could reflect
bronchopneumonia. No associated pleural effusion or consolidation.

## 2016-11-01 DIAGNOSIS — E1159 Type 2 diabetes mellitus with other circulatory complications: Secondary | ICD-10-CM | POA: Diagnosis not present

## 2016-11-01 DIAGNOSIS — I10 Essential (primary) hypertension: Secondary | ICD-10-CM | POA: Diagnosis not present

## 2016-11-01 DIAGNOSIS — Z23 Encounter for immunization: Secondary | ICD-10-CM | POA: Diagnosis not present

## 2016-11-01 DIAGNOSIS — E785 Hyperlipidemia, unspecified: Secondary | ICD-10-CM | POA: Diagnosis not present

## 2016-11-04 ENCOUNTER — Telehealth: Payer: Self-pay

## 2016-11-04 NOTE — Telephone Encounter (Signed)
[facility] in [place] sent med refill request for metoprolol to be refilled. Pt needs appt to see Dr. Reine Justevanakr 1st. Pt hasn't been seen since the transition. LM to call back so that she can get scheduled.

## 2016-11-04 NOTE — Telephone Encounter (Signed)
Pt's granddaughter call back to schedule pt to see RRR  11/02 for routine f/u . Pt has enough medication to last until then. I will hold on sending rx refill until visit just in case there are any changes that needs to be made.

## 2016-11-08 DIAGNOSIS — E1159 Type 2 diabetes mellitus with other circulatory complications: Secondary | ICD-10-CM | POA: Diagnosis not present

## 2016-11-08 DIAGNOSIS — E785 Hyperlipidemia, unspecified: Secondary | ICD-10-CM | POA: Diagnosis not present

## 2016-11-08 DIAGNOSIS — I10 Essential (primary) hypertension: Secondary | ICD-10-CM | POA: Diagnosis not present

## 2016-11-08 DIAGNOSIS — E1129 Type 2 diabetes mellitus with other diabetic kidney complication: Secondary | ICD-10-CM | POA: Diagnosis not present

## 2016-11-12 ENCOUNTER — Encounter: Payer: Self-pay | Admitting: Cardiology

## 2016-11-12 ENCOUNTER — Ambulatory Visit (INDEPENDENT_AMBULATORY_CARE_PROVIDER_SITE_OTHER): Payer: Medicare Other | Admitting: Cardiology

## 2016-11-12 VITALS — BP 170/80 | HR 64 | Ht 66.0 in | Wt 169.8 lb

## 2016-11-12 DIAGNOSIS — E785 Hyperlipidemia, unspecified: Secondary | ICD-10-CM | POA: Diagnosis not present

## 2016-11-12 DIAGNOSIS — Z951 Presence of aortocoronary bypass graft: Secondary | ICD-10-CM | POA: Diagnosis not present

## 2016-11-12 DIAGNOSIS — I10 Essential (primary) hypertension: Secondary | ICD-10-CM | POA: Diagnosis not present

## 2016-11-12 DIAGNOSIS — I2510 Atherosclerotic heart disease of native coronary artery without angina pectoris: Secondary | ICD-10-CM

## 2016-11-12 NOTE — Progress Notes (Signed)
Cardiology Office Note:    Date:  11/12/2016   ID:  Mary [name], DOB 01/31/1940, MRN [id number]  PCP:  Charlott Rakes, MD  Cardiologist:  Garwin Brothers, MD   Referring MD: Charlott Rakes, MD    ASSESSMENT:    1. CAD in native artery   2. Essential (primary) hypertension   3. Dyslipidemia   4. H/O coronary artery bypass surgery    PLAN:    In order of problems listed above:  1. Secondary prevention stressed with the patient.  Importance of compliance with diet and medications stressed.  Importance of regular exercise stressed.  Blood work is followed by her primary care physician. 2. Her blood pressure is elevated in our institution to keep a track of her blood pressures twice a day for a week and fax those reports to me and he agrees. 3. Patient will be seen in follow-up appointment in 6 months or earlier if the patient has any concerns.    Medication Adjustments/Labs and Tests Ordered: Current medicines are reviewed at length with the patient today.  Concerns regarding medicines are outlined above.  No orders of the defined types were placed in this encounter.  No orders of the defined types were placed in this encounter.    History of Present Illness:    Mary [name] is a 76 y.o. female who is being seen today for the evaluation of coronary artery disease at the request of Charlott Rakes, MD.  Patient is a pleasant 76 year old female.  She has past medical history of coronary artery disease post bypass surgery.  I have taken care of her in my previous practice.  She underwent coronary stenting at [facility].  She was referred that as this was a difficult procedure.  The anatomy was complex.  Fortunately she has done well subsequently she went out of the country and has done very well and has enjoyed staying out of the country.  She walks on a regular basis he denies any chest pain orthopnea or PND.  She tells me that her blood pressure was stable at the time  and it appears that she may have an element of whitecoat hypertension.  She recently saw her primary care physician and got blood work done.  Past Medical History:  Diagnosis Date  . Arthritis   . Coronary artery disease   . Diabetes mellitus without complication (HCC)    DM II  . H/O transfusion   . Hyperlipemia     Past Surgical History:  Procedure Laterality Date  . BACK SURGERY  1992  . CARDIAC CATHETERIZATION    . CARPAL TUNNEL RELEASE Left   . COLONOSCOPY    . CORONARY ARTERY BYPASS GRAFT  11/10/1994   x 3  . EYE SURGERY Bilateral    Glaucoma  . TOTAL KNEE ARTHROPLASTY Right 04/01/2014   Procedure: RIGHT TOTAL KNEE ARTHROPLASTY;  Surgeon: Gean Birchwood, MD;  Location: [facility] OR;  Service: Orthopedics;  Laterality: Right;  . TOTAL KNEE ARTHROPLASTY Left 06/02/2015   Procedure: LEFT TOTAL KNEE ARTHROPLASTY;  Surgeon: Gean Birchwood, MD;  Location: [facility] OR;  Service: Orthopedics;  Laterality: Left;  . TUBAL LIGATION      Current Medications: Current Meds  Medication Sig  . acetaminophen (TYLENOL) 325 MG tablet Take 650 mg by mouth every 6 (six) hours as needed.  . benazepril (LOTENSIN) 20 MG tablet Take 20 mg by mouth daily.  . clopidogrel (PLAVIX) 75 MG tablet Take 75 mg by mouth daily.  Marland Kitchen  esomeprazole (NEXIUM) 40 MG capsule Take 40 mg by mouth daily.  . fenofibrate (TRICOR) 145 MG tablet Take 145 mg by mouth daily.  . isosorbide mononitrate (IMDUR) 60 MG 24 hr tablet Take 60 mg by mouth.  . metoprolol tartrate (LOPRESSOR) 25 MG tablet Take 25 mg by mouth 2 (two) times daily.  . Omega-3 Fatty Acids (FISH OIL) 1000 MG CAPS Take 1,000 mg by mouth daily.  Marland Kitchen. oxyCODONE-acetaminophen (ROXICET) 5-325 MG tablet Take 1 tablet by mouth every 4 (four) hours as needed.  . ranolazine (RANEXA) 1000 MG SR tablet Take 1,000 mg by mouth 2 (two) times daily.   . rosuvastatin (CRESTOR) 10 MG tablet Take 10 mg by mouth.  . sitaGLIPtin-metformin (JANUMET) 50-1000 MG tablet Take 1 tablet by mouth 2 (two)  times daily with a meal.   . [DISCONTINUED] aspirin EC 325 MG tablet Take 1 tablet (325 mg total) by mouth 2 (two) times daily.     Allergies:   Patient has no known allergies.   Social History   Social History  . Marital status: Single    Spouse name: N/A  . Number of children: N/A  . Years of education: N/A   Social History Main Topics  . Smoking status: Former Smoker    Packs/day: 0.50    Years: 5.00    Quit date: 04/05/1985  . Smokeless tobacco: Never Used  . Alcohol use No  . Drug use: No  . Sexual activity: No   Other Topics Concern  . None   Social History Narrative  . None     Family History: The patient's family history is not on file.  ROS:   Please see the history of present illness.    All other systems reviewed and are negative.  EKGs/Labs/Other Studies Reviewed:    The following studies were reviewed today: I discussed my findings with the patient extensively.  I reviewed previous records we will try to obtain the report of blood work done at primary care physician.   Recent Labs: No results found for requested labs within last 8760 hours.  Recent Lipid Panel No results found for: CHOL, TRIG, HDL, CHOLHDL, VLDL, LDLCALC, LDLDIRECT  Physical Exam:    VS:  BP (!) 170/80   Pulse 64   Ht 5\' 6"  (1.676 m)   Wt 169 lb 12.8 oz (77 kg)   SpO2 97%   BMI 27.41 kg/m     Wt Readings from Last 3 Encounters:  11/12/16 169 lb 12.8 oz (77 kg)  06/02/15 160 lb (72.6 kg)  04/06/14 173 lb 15.1 oz (78.9 kg)     GEN: Patient is in no acute distress HEENT: Normal NECK: No JVD; No carotid bruits LYMPHATICS: No lymphadenopathy CARDIAC: S1 S2 regular, 2/6 systolic murmur at the apex. RESPIRATORY:  Clear to auscultation without rales, wheezing or rhonchi  ABDOMEN: Soft, non-tender, non-distended MUSCULOSKELETAL:  No edema; No deformity  SKIN: Warm and dry NEUROLOGIC:  Alert and oriented x 3 PSYCHIATRIC:  Normal affect    Signed, Garwin Brothersajan R Rebeckah Masih, MD    11/12/2016 4:44 PM    [facility]

## 2016-11-15 ENCOUNTER — Telehealth: Payer: Self-pay

## 2016-11-15 NOTE — Telephone Encounter (Signed)
Request for labs per Revankar from PCP

## 2016-11-22 ENCOUNTER — Telehealth: Payer: Self-pay

## 2016-11-22 ENCOUNTER — Other Ambulatory Visit: Payer: Self-pay

## 2016-11-22 DIAGNOSIS — E785 Hyperlipidemia, unspecified: Secondary | ICD-10-CM

## 2016-11-22 MED ORDER — ROSUVASTATIN CALCIUM 20 MG PO TABS: 20.0000 mg | ORAL_TABLET | Freq: Every evening | ORAL | 1 refills | Status: DC

## 2016-11-22 MED ORDER — METOPROLOL TARTRATE 25 MG PO TABS: 25.0000 mg | ORAL_TABLET | Freq: Two times a day (BID) | ORAL | 3 refills | Status: DC

## 2016-11-22 NOTE — Addendum Note (Signed)
Addended by: Craige CottaANDERSON, Railey Glad S on: 11/22/2016 10:26 AM   Modules accepted: Orders

## 2016-11-22 NOTE — Telephone Encounter (Signed)
Spoke with son (okay per DPR) informed him of med change to crestor as well as refill sent for metoprolol. Informed son that the patient needs to be exercising and decreasing her intake of carbohydrates. Also requested that the patient go in 2 months for liver/lipid panel at [facility] in [place]. The son was understanding of all the information and did not have any further questions or concerns.

## 2017-02-14 DIAGNOSIS — Z9181 History of falling: Secondary | ICD-10-CM | POA: Diagnosis not present

## 2017-02-14 DIAGNOSIS — E785 Hyperlipidemia, unspecified: Secondary | ICD-10-CM | POA: Diagnosis not present

## 2017-02-14 DIAGNOSIS — Z139 Encounter for screening, unspecified: Secondary | ICD-10-CM | POA: Diagnosis not present

## 2017-02-14 DIAGNOSIS — Z1331 Encounter for screening for depression: Secondary | ICD-10-CM | POA: Diagnosis not present

## 2017-02-14 DIAGNOSIS — I10 Essential (primary) hypertension: Secondary | ICD-10-CM | POA: Diagnosis not present

## 2017-02-14 DIAGNOSIS — Z0000 Encounter for general adult medical examination without abnormal findings: Secondary | ICD-10-CM | POA: Diagnosis not present

## 2017-02-21 DIAGNOSIS — I10 Essential (primary) hypertension: Secondary | ICD-10-CM | POA: Diagnosis not present

## 2017-02-21 DIAGNOSIS — E1159 Type 2 diabetes mellitus with other circulatory complications: Secondary | ICD-10-CM | POA: Diagnosis not present

## 2017-02-21 DIAGNOSIS — E785 Hyperlipidemia, unspecified: Secondary | ICD-10-CM | POA: Diagnosis not present

## 2017-03-02 DIAGNOSIS — E1159 Type 2 diabetes mellitus with other circulatory complications: Secondary | ICD-10-CM | POA: Diagnosis not present

## 2017-03-02 DIAGNOSIS — I2510 Atherosclerotic heart disease of native coronary artery without angina pectoris: Secondary | ICD-10-CM | POA: Diagnosis not present

## 2017-03-02 DIAGNOSIS — E785 Hyperlipidemia, unspecified: Secondary | ICD-10-CM | POA: Diagnosis not present

## 2017-03-02 DIAGNOSIS — Z789 Other specified health status: Secondary | ICD-10-CM | POA: Diagnosis not present

## 2017-03-02 DIAGNOSIS — I10 Essential (primary) hypertension: Secondary | ICD-10-CM | POA: Diagnosis not present

## 2017-11-23 DIAGNOSIS — E1159 Type 2 diabetes mellitus with other circulatory complications: Secondary | ICD-10-CM | POA: Diagnosis not present

## 2017-11-23 DIAGNOSIS — I10 Essential (primary) hypertension: Secondary | ICD-10-CM | POA: Diagnosis not present

## 2017-11-23 DIAGNOSIS — E785 Hyperlipidemia, unspecified: Secondary | ICD-10-CM | POA: Diagnosis not present

## 2017-11-24 ENCOUNTER — Other Ambulatory Visit: Payer: Self-pay | Admitting: Cardiology

## 2017-11-28 ENCOUNTER — Other Ambulatory Visit: Payer: Self-pay

## 2017-11-30 DIAGNOSIS — I2510 Atherosclerotic heart disease of native coronary artery without angina pectoris: Secondary | ICD-10-CM | POA: Diagnosis not present

## 2017-11-30 DIAGNOSIS — I10 Essential (primary) hypertension: Secondary | ICD-10-CM | POA: Diagnosis not present

## 2017-11-30 DIAGNOSIS — E1159 Type 2 diabetes mellitus with other circulatory complications: Secondary | ICD-10-CM | POA: Diagnosis not present

## 2017-11-30 DIAGNOSIS — E785 Hyperlipidemia, unspecified: Secondary | ICD-10-CM | POA: Diagnosis not present

## 2017-12-23 ENCOUNTER — Other Ambulatory Visit: Payer: Self-pay | Admitting: Cardiology

## 2017-12-23 DIAGNOSIS — J209 Acute bronchitis, unspecified: Secondary | ICD-10-CM | POA: Diagnosis not present

## 2017-12-23 DIAGNOSIS — R05 Cough: Secondary | ICD-10-CM | POA: Diagnosis not present

## 2018-01-05 ENCOUNTER — Telehealth: Payer: Self-pay | Admitting: Cardiology

## 2018-01-05 NOTE — Telephone Encounter (Signed)
° ° ° °  1. Which medications need to be refilled? (please list name of each medication and dose if known) metoprolol 25mg   2. Which pharmacy/location (including street and city if local pharmacy) is medication to be sent to? [facility] in [place]  3. Do they need a 30 day or 90 day supply? 60

## 2018-01-05 NOTE — Telephone Encounter (Signed)
Patient needs appointment for refills. She is scheduled for tomorrow.

## 2018-01-06 ENCOUNTER — Ambulatory Visit (INDEPENDENT_AMBULATORY_CARE_PROVIDER_SITE_OTHER): Payer: Medicare Other | Admitting: Cardiology

## 2018-01-06 ENCOUNTER — Encounter: Payer: Self-pay | Admitting: Cardiology

## 2018-01-06 VITALS — BP 140/68 | HR 69 | Ht 66.0 in | Wt 165.0 lb

## 2018-01-06 DIAGNOSIS — Z951 Presence of aortocoronary bypass graft: Secondary | ICD-10-CM | POA: Diagnosis not present

## 2018-01-06 DIAGNOSIS — E088 Diabetes mellitus due to underlying condition with unspecified complications: Secondary | ICD-10-CM | POA: Diagnosis not present

## 2018-01-06 DIAGNOSIS — I10 Essential (primary) hypertension: Secondary | ICD-10-CM | POA: Diagnosis not present

## 2018-01-06 DIAGNOSIS — I2510 Atherosclerotic heart disease of native coronary artery without angina pectoris: Secondary | ICD-10-CM | POA: Diagnosis not present

## 2018-01-06 MED ORDER — METOPROLOL SUCCINATE ER 50 MG PO TB24: 50.0000 mg | ORAL_TABLET | Freq: Every day | ORAL | 3 refills | Status: DC

## 2018-01-06 MED ORDER — NITROGLYCERIN 0.4 MG SL SUBL: 0.4000 mg | SUBLINGUAL_TABLET | SUBLINGUAL | 3 refills | Status: DC | PRN

## 2018-01-06 NOTE — Progress Notes (Signed)
Cardiology Office Note:    Date:  01/06/2018   ID:  Mary [name], DOB [birth year], MRN [id number]  PCP:  Charlott RakesHodges, Francisco, MD  Cardiologist:  Garwin Brothersajan R Krisinda Giovanni, MD   Referring MD: Charlott RakesHodges, Francisco, MD    ASSESSMENT:    1. CAD in native artery   2. Essential (primary) hypertension   3. Diabetes mellitus due to underlying condition with unspecified complications (HCC)   4. H/O coronary artery bypass surgery    PLAN:    In order of problems listed above:  1. Secondary prevention stressed with the patient.  Importance of compliance with diet and medication stressed and she vocalized understanding.  Her blood pressure is stable.  Diet was discussed for dyslipidemia.  Importance of regular exercise stressed. 2. Sublingual nitroglycerin prescription was sent, its protocol and 911 protocol explained and the patient vocalized understanding questions were answered to the patient's satisfaction 3. I changed her medication to metoprolol succinate 50 mg daily from what she is taking now. 4. Patient will be seen in follow-up appointment in 6 months or earlier if the patient has any concerns    Medication Adjustments/Labs and Tests Ordered: Current medicines are reviewed at length with the patient today.  Concerns regarding medicines are outlined above.  No orders of the defined types were placed in this encounter.  No orders of the defined types were placed in this encounter.    No chief complaint on file.    History of Present Illness:    Mary [name] is a 77 y.o. female.  Patient has past medical history of coronary artery disease post stenting.  Is accompanied by her son who helps her interpret.  Patient travels outside the [place] significantly.  She has no chest pain orthopnea or PND.  She mentions to me that once a month she has chest tightness which is relieved with rest.  She does not have nitroglycerin with her for some reason.  She is very happy with the way she is functioning.  Her  primary care doctor checks her lipids.  Past Medical History:  Diagnosis Date  . Arthritis   . Coronary artery disease   . Diabetes mellitus without complication (HCC)    DM II  . H/O transfusion   . Hyperlipemia     Past Surgical History:  Procedure Laterality Date  . BACK SURGERY  1992  . CARDIAC CATHETERIZATION    . CARPAL TUNNEL RELEASE Left   . COLONOSCOPY    . CORONARY ARTERY BYPASS GRAFT  11/10/1994   x 3  . EYE SURGERY Bilateral    Glaucoma  . TOTAL KNEE ARTHROPLASTY Right 04/01/2014   Procedure: RIGHT TOTAL KNEE ARTHROPLASTY;  Surgeon: Gean BirchwoodFrank Rowan, MD;  Location: [facility] OR;  Service: Orthopedics;  Laterality: Right;  . TOTAL KNEE ARTHROPLASTY Left 06/02/2015   Procedure: LEFT TOTAL KNEE ARTHROPLASTY;  Surgeon: Gean BirchwoodFrank Rowan, MD;  Location: [facility] OR;  Service: Orthopedics;  Laterality: Left;  . TUBAL LIGATION      Current Medications: Current Meds  Medication Sig  . aspirin EC 81 MG tablet Take 81 mg by mouth daily.  . clopidogrel (PLAVIX) 75 MG tablet Take 75 mg by mouth daily.  Marland Kitchen. esomeprazole (NEXIUM) 40 MG capsule Take 40 mg by mouth daily.  . fenofibrate (TRICOR) 145 MG tablet Take 145 mg by mouth daily.  . isosorbide mononitrate (IMDUR) 60 MG 24 hr tablet Take 60 mg by mouth.  . metoprolol tartrate (LOPRESSOR) 25 MG tablet TAKE 1 TABLET BY MOUTH TWICE DAILY  .  olmesartan (BENICAR) 20 MG tablet Take 20 mg by mouth daily.  . ranolazine (RANEXA) 1000 MG SR tablet Take 1,000 mg by mouth 2 (two) times daily.   . sitaGLIPtin-metformin (JANUMET) 50-1000 MG tablet Take 1 tablet by mouth 2 (two) times daily with a meal.      Allergies:   Patient has no known allergies.   Social History   Socioeconomic History  . Marital status: Single    Spouse name: Not on file  . Number of children: Not on file  . Years of education: Not on file  . Highest education level: Not on file  Occupational History  . Not on file  Social Needs  . Financial resource strain: Not on file  .  Food insecurity:    Worry: Not on file    Inability: Not on file  . Transportation needs:    Medical: Not on file    Non-medical: Not on file  Tobacco Use  . Smoking status: Former Smoker    Packs/day: 0.50    Years: 5.00    Pack years: 2.50    Last attempt to quit: 04/05/1985    Years since quitting: 32.7  . Smokeless tobacco: Never Used  Substance and Sexual Activity  . Alcohol use: No  . Drug use: No  . Sexual activity: Never  Lifestyle  . Physical activity:    Days per week: Not on file    Minutes per session: Not on file  . Stress: Not on file  Relationships  . Social connections:    Talks on phone: Not on file    Gets together: Not on file    Attends religious service: Not on file    Active member of club or organization: Not on file    Attends meetings of clubs or organizations: Not on file    Relationship status: Not on file  Other Topics Concern  . Not on file  Social History Narrative  . Not on file     Family History: The patient's family history is not on file.  ROS:   Please see the history of present illness.    All other systems reviewed and are negative.  EKGs/Labs/Other Studies Reviewed:    The following studies were reviewed today: EKG reveals sinus rhythm and nonspecific ST-T changes.   Recent Labs: No results found for requested labs within last 8760 hours.  Recent Lipid Panel No results found for: CHOL, TRIG, HDL, CHOLHDL, VLDL, LDLCALC, LDLDIRECT  Physical Exam:    VS:  BP 140/68 (BP Location: Right Arm, Patient Position: Sitting, Cuff Size: Normal)   Pulse 69   Ht 5\' 6"  (1.676 m)   Wt 165 lb (74.8 kg)   SpO2 98%   BMI 26.63 kg/m     Wt Readings from Last 3 Encounters:  01/06/18 165 lb (74.8 kg)  11/12/16 169 lb 12.8 oz (77 kg)  06/02/15 160 lb (72.6 kg)     GEN: Patient is in no acute distress HEENT: Normal NECK: No JVD; No carotid bruits LYMPHATICS: No lymphadenopathy CARDIAC: Hear sounds regular, 2/6 systolic murmur at  the apex. RESPIRATORY:  Clear to auscultation without rales, wheezing or rhonchi  ABDOMEN: Soft, non-tender, non-distended MUSCULOSKELETAL:  No edema; No deformity  SKIN: Warm and dry NEUROLOGIC:  Alert and oriented x 3 PSYCHIATRIC:  Normal affect   Signed, Garwin Brothersajan R Ninetta Adelstein, MD  01/06/2018 9:29 AM    [facility]

## 2018-01-06 NOTE — Patient Instructions (Signed)
Medication Instructions:  Your physician has recommended you make the following change in your medication:  Start: Metoprolol succinate 50 mg daily  If you need a refill on your cardiac medications before your next appointment, please call your pharmacy.   Lab work: None  If you have labs (blood work) drawn today and your tests are completely normal, you will receive your results only by: Marland Kitchen. MyChart Message (if you have MyChart) OR . A paper copy in the mail If you have any lab test that is abnormal or we need to change your treatment, we will call you to review the results.  Testing/Procedures: None  Follow-Up: At [facility], you and your health needs are our priority.  As part of our continuing mission to provide you with exceptional heart care, we have created designated Provider Care Teams.  These Care Teams include your primary Cardiologist (physician) and Advanced Practice Providers (APPs -  Physician Assistants and Nurse Practitioners) who all work together to provide you with the care you need, when you need it. You will need a follow up appointment in 6 months.  Please call our office 2 months in advance to schedule this appointment.  You may see No primary care provider on file. or another member of our [facility] Provider Team in [place]: Gypsy Balsamobert Krasowski, MD . Norman HerrlichBrian Munley, MD  Any Other Special Instructions Will Be Listed Below (If Applicable).

## 2018-01-18 DIAGNOSIS — Z23 Encounter for immunization: Secondary | ICD-10-CM | POA: Diagnosis not present

## 2018-07-31 ENCOUNTER — Encounter: Payer: Self-pay | Admitting: Cardiology

## 2018-07-31 ENCOUNTER — Ambulatory Visit (INDEPENDENT_AMBULATORY_CARE_PROVIDER_SITE_OTHER): Payer: Medicare Other | Admitting: Cardiology

## 2018-07-31 ENCOUNTER — Other Ambulatory Visit: Payer: Self-pay

## 2018-07-31 VITALS — BP 160/82 | Ht 66.0 in | Wt 165.0 lb

## 2018-07-31 DIAGNOSIS — E785 Hyperlipidemia, unspecified: Secondary | ICD-10-CM | POA: Diagnosis not present

## 2018-07-31 DIAGNOSIS — I25709 Atherosclerosis of coronary artery bypass graft(s), unspecified, with unspecified angina pectoris: Secondary | ICD-10-CM

## 2018-07-31 DIAGNOSIS — E088 Diabetes mellitus due to underlying condition with unspecified complications: Secondary | ICD-10-CM | POA: Diagnosis not present

## 2018-07-31 DIAGNOSIS — E1159 Type 2 diabetes mellitus with other circulatory complications: Secondary | ICD-10-CM | POA: Diagnosis not present

## 2018-07-31 DIAGNOSIS — Z951 Presence of aortocoronary bypass graft: Secondary | ICD-10-CM

## 2018-07-31 DIAGNOSIS — I10 Essential (primary) hypertension: Secondary | ICD-10-CM | POA: Diagnosis not present

## 2018-07-31 HISTORY — DX: Atherosclerosis of coronary artery bypass graft(s), unspecified, with unspecified angina pectoris: I25.709

## 2018-07-31 NOTE — Progress Notes (Signed)
Cardiology Office Note:    Date:  07/31/2018   ID:  Mary [name], DOB 01-Jun-1940, MRN [id number]  PCP:  Charlott RakesHodges, Francisco, MD  Cardiologist:  Garwin Brothersajan R Caralina Nop, MD   Referring MD: Charlott RakesHodges, Francisco, MD    ASSESSMENT:    1. Coronary artery disease involving coronary bypass graft of native heart with angina pectoris (HCC)   2. Essential (primary) hypertension   3. Diabetes mellitus due to underlying condition with unspecified complications (HCC)   4. H/O coronary artery bypass surgery   5. Dyslipidemia    PLAN:    In order of problems listed above:  1. Coronary artery disease: Secondary prevention stressed with the patient.  Importance of compliance with diet and medication stressed and she vocalized understanding.  Her exercise tolerance is excellent. 2. Essential hypertension: Blood pressure stable 3. Mixed dyslipidemia, diet was discussed.  We are awaiting labs from primary care physician. 4. Patient will be seen in follow-up appointment in 6 months or earlier if the patient has any concerns    Medication Adjustments/Labs and Tests Ordered: Current medicines are reviewed at length with the patient today.  Concerns regarding medicines are outlined above.  No orders of the defined types were placed in this encounter.  No orders of the defined types were placed in this encounter.    No chief complaint on file.    History of Present Illness:    Mary [name] is a 78 y.o. female.  Patient has known coronary artery disease.  She stays for a lot of time outside the country with her [place].  She recently came [place] into the [place] ago.  She denies any chest pain orthopnea or PND.  She has essential hypertension and dyslipidemia.  She mentions to me that she had blood work by her primary care physician this morning and we will try to get me a copy of those results.  She walks half an hour a day without any problems.  At the time of my evaluation, the patient is alert  awake oriented and in no distress.  Past Medical History:  Diagnosis Date  . Arthritis   . Coronary artery disease   . Diabetes mellitus without complication (HCC)    DM II  . H/O transfusion   . Hyperlipemia     Past Surgical History:  Procedure Laterality Date  . BACK SURGERY  1992  . CARDIAC CATHETERIZATION    . CARPAL TUNNEL RELEASE Left   . COLONOSCOPY    . CORONARY ARTERY BYPASS GRAFT  11/10/1994   x 3  . EYE SURGERY Bilateral    Glaucoma  . TOTAL KNEE ARTHROPLASTY Right 04/01/2014   Procedure: RIGHT TOTAL KNEE ARTHROPLASTY;  Surgeon: Gean BirchwoodFrank Rowan, MD;  Location: [facility] OR;  Service: Orthopedics;  Laterality: Right;  . TOTAL KNEE ARTHROPLASTY Left 06/02/2015   Procedure: LEFT TOTAL KNEE ARTHROPLASTY;  Surgeon: Gean BirchwoodFrank Rowan, MD;  Location: [facility] OR;  Service: Orthopedics;  Laterality: Left;  . TUBAL LIGATION      Current Medications: Current Meds  Medication Sig  . aspirin EC 81 MG tablet Take 81 mg by mouth daily.  . clopidogrel (PLAVIX) 75 MG tablet Take 75 mg by mouth daily.  Marland Kitchen. esomeprazole (NEXIUM) 40 MG capsule Take 40 mg by mouth daily.  . isosorbide mononitrate (IMDUR) 60 MG 24 hr tablet Take 60 mg by mouth.  . Omega-3 Fatty Acids (FISH OIL) 1000 MG CAPS Take 1,000 mg by mouth daily.  . rosuvastatin (CRESTOR) 10 MG tablet  Take 10 mg by mouth daily.  . sitaGLIPtin-metformin (JANUMET) 50-1000 MG tablet Take 1 tablet by mouth 2 (two) times daily with a meal.      Allergies:   Patient has no known allergies.   Social History   Socioeconomic History  . Marital status: Single    Spouse name: Not on file  . Number of children: Not on file  . Years of education: Not on file  . Highest education level: Not on file  Occupational History  . Not on file  Social Needs  . Financial resource strain: Not on file  . Food insecurity    Worry: Not on file    Inability: Not on file  . Transportation needs    Medical: Not on file    Non-medical: Not on file  Tobacco Use  .  Smoking status: Former Smoker    Packs/day: 0.50    Years: 5.00    Pack years: 2.50    Quit date: 04/05/1985    Years since quitting: 33.3  . Smokeless tobacco: Never Used  Substance and Sexual Activity  . Alcohol use: No  . Drug use: No  . Sexual activity: Never  Lifestyle  . Physical activity    Days per week: Not on file    Minutes per session: Not on file  . Stress: Not on file  Relationships  . Social Herbalist on phone: Not on file    Gets together: Not on file    Attends religious service: Not on file    Active member of club or organization: Not on file    Attends meetings of clubs or organizations: Not on file    Relationship status: Not on file  Other Topics Concern  . Not on file  Social History Narrative  . Not on file     Family History: The patient's family history is not on file.  ROS:   Please see the history of present illness.    All other systems reviewed and are negative.  EKGs/Labs/Other Studies Reviewed:    The following studies were reviewed today: As mentioned above.  EKG reveals sinus rhythm and nonspecific ST-T changes.   Recent Labs: No results found for requested labs within last 8760 hours.  Recent Lipid Panel No results found for: CHOL, TRIG, HDL, CHOLHDL, VLDL, LDLCALC, LDLDIRECT  Physical Exam:    VS:  BP (!) 160/82 (BP Location: Left Arm, Patient Position: Sitting, Cuff Size: Normal)   Ht 5\' 6"  (1.676 m)   Wt 165 lb (74.8 kg)   SpO2 98%   BMI 26.63 kg/m     Wt Readings from Last 3 Encounters:  07/31/18 165 lb (74.8 kg)  01/06/18 165 lb (74.8 kg)  11/12/16 169 lb 12.8 oz (77 kg)     GEN: Patient is in no acute distress HEENT: Normal NECK: No JVD; No carotid bruits LYMPHATICS: No lymphadenopathy CARDIAC: Hear sounds regular, 2/6 systolic murmur at the apex. RESPIRATORY:  Clear to auscultation without rales, wheezing or rhonchi  ABDOMEN: Soft, non-tender, non-distended MUSCULOSKELETAL:  No edema; No  deformity  SKIN: Warm and dry NEUROLOGIC:  Alert and oriented x 3 PSYCHIATRIC:  Normal affect   Signed, Jenean Lindau, MD  07/31/2018 1:38 PM    [facility]

## 2018-07-31 NOTE — Addendum Note (Signed)
Addended by: Beckey Rutter on: 07/31/2018 03:15 PM   Modules accepted: Orders

## 2018-07-31 NOTE — Patient Instructions (Signed)
Medication Instructions:  Your physician recommends that you continue on your current medications as directed. Please refer to the Current Medication list given to you today.  If you need a refill on your cardiac medications before your next appointment, please call your pharmacy.   Lab work: None If you have labs (blood work) drawn today and your tests are completely normal, you will receive your results only by: . MyChart Message (if you have MyChart) OR . A paper copy in the mail If you have any lab test that is abnormal or we need to change your treatment, we will call you to review the results.  Testing/Procedures: None  Follow-Up: At [facility], you and your health needs are our priority.  As part of our continuing mission to provide you with exceptional heart care, we have created designated Provider Care Teams.  These Care Teams include your primary Cardiologist (physician) and Advanced Practice Providers (APPs -  Physician Assistants and Nurse Practitioners) who all work together to provide you with the care you need, when you need it. You will need a follow up appointment in 6 months.  Please call our office 2 months in advance to schedule this appointment.  You may see No primary care provider on file. or another member of our [facility] Provider Team in [place]: Robert Krasowski, MD . Brian Munley, MD  Any Other Special Instructions Will Be Listed Below (If Applicable).    

## 2018-08-07 DIAGNOSIS — Z139 Encounter for screening, unspecified: Secondary | ICD-10-CM | POA: Diagnosis not present

## 2018-08-07 DIAGNOSIS — E1129 Type 2 diabetes mellitus with other diabetic kidney complication: Secondary | ICD-10-CM | POA: Diagnosis not present

## 2018-08-07 DIAGNOSIS — E785 Hyperlipidemia, unspecified: Secondary | ICD-10-CM | POA: Diagnosis not present

## 2018-08-07 DIAGNOSIS — E1159 Type 2 diabetes mellitus with other circulatory complications: Secondary | ICD-10-CM | POA: Diagnosis not present

## 2018-08-07 DIAGNOSIS — I10 Essential (primary) hypertension: Secondary | ICD-10-CM | POA: Diagnosis not present

## 2018-08-07 DIAGNOSIS — Z0000 Encounter for general adult medical examination without abnormal findings: Secondary | ICD-10-CM | POA: Diagnosis not present

## 2018-08-08 ENCOUNTER — Telehealth: Payer: Self-pay | Admitting: Cardiology

## 2018-08-08 NOTE — Telephone Encounter (Signed)
°*  STAT* If patient is at the pharmacy, call can be transferred to refill team.   1. Which medications need to be refilled? (please list name of each medication and dose if known) Olmesartan 20mg , Ranexa 1000mg   2. Which pharmacy/location (including street and city if local pharmacy) is medication to be sent to?[facility] on [address] in [place]  3. Do they need a 30 day or 90 day supply? [place]

## 2018-08-09 ENCOUNTER — Other Ambulatory Visit: Payer: Self-pay

## 2018-08-09 MED ORDER — OLMESARTAN MEDOXOMIL 20 MG PO TABS: 20.0000 mg | ORAL_TABLET | Freq: Every day | ORAL | 1 refills | Status: DC

## 2018-08-09 MED ORDER — RANOLAZINE ER 500 MG PO TB12: 1000.0000 mg | ORAL_TABLET | Freq: Two times a day (BID) | ORAL | 3 refills | Status: DC

## 2018-08-09 MED ORDER — RANOLAZINE ER 1000 MG PO TB12: 1000.0000 mg | ORAL_TABLET | Freq: Two times a day (BID) | ORAL | 3 refills | Status: DC

## 2018-08-09 NOTE — Telephone Encounter (Signed)
Refill on olmesartan and ranexa sent.

## 2018-10-07 DIAGNOSIS — U071 COVID-19: Secondary | ICD-10-CM | POA: Diagnosis not present

## 2018-10-10 DIAGNOSIS — B342 Coronavirus infection, unspecified: Secondary | ICD-10-CM | POA: Insufficient documentation

## 2018-10-10 HISTORY — DX: Coronavirus infection, unspecified: B34.2

## 2018-10-20 ENCOUNTER — Other Ambulatory Visit: Payer: Self-pay | Admitting: Cardiology

## 2018-11-27 DIAGNOSIS — E1159 Type 2 diabetes mellitus with other circulatory complications: Secondary | ICD-10-CM | POA: Diagnosis not present

## 2018-11-27 DIAGNOSIS — E785 Hyperlipidemia, unspecified: Secondary | ICD-10-CM | POA: Diagnosis not present

## 2018-12-04 DIAGNOSIS — Z23 Encounter for immunization: Secondary | ICD-10-CM | POA: Diagnosis not present

## 2018-12-04 DIAGNOSIS — E1159 Type 2 diabetes mellitus with other circulatory complications: Secondary | ICD-10-CM | POA: Diagnosis not present

## 2018-12-04 DIAGNOSIS — I2510 Atherosclerotic heart disease of native coronary artery without angina pectoris: Secondary | ICD-10-CM | POA: Diagnosis not present

## 2018-12-04 DIAGNOSIS — E785 Hyperlipidemia, unspecified: Secondary | ICD-10-CM | POA: Diagnosis not present

## 2018-12-04 DIAGNOSIS — E1129 Type 2 diabetes mellitus with other diabetic kidney complication: Secondary | ICD-10-CM | POA: Diagnosis not present

## 2019-01-20 ENCOUNTER — Other Ambulatory Visit: Payer: Self-pay | Admitting: Cardiology

## 2019-01-22 DIAGNOSIS — I10 Essential (primary) hypertension: Secondary | ICD-10-CM | POA: Diagnosis not present

## 2019-01-22 DIAGNOSIS — E1159 Type 2 diabetes mellitus with other circulatory complications: Secondary | ICD-10-CM | POA: Diagnosis not present

## 2019-01-22 DIAGNOSIS — I2510 Atherosclerotic heart disease of native coronary artery without angina pectoris: Secondary | ICD-10-CM | POA: Diagnosis not present

## 2019-05-01 ENCOUNTER — Other Ambulatory Visit: Payer: Self-pay | Admitting: Cardiology

## 2019-05-01 MED ORDER — METOPROLOL SUCCINATE ER 50 MG PO TB24: 50.0000 mg | ORAL_TABLET | Freq: Every day | ORAL | 3 refills | Status: DC

## 2019-05-01 NOTE — Telephone Encounter (Signed)
*  STAT* If patient is at the pharmacy, call can be transferred to refill team.   1. Which medications need to be refilled? (please list name of each medication and dose if known)    metoprolol succinate (TOPROL-XL) 50 MG 24 hr tablet     2. Which pharmacy/location (including street and city if local pharmacy) is medication to be sent to? [facility] -[address] [place], [place] [address]   3. Do they need a 30 day or 90 day supply? 90 day supply

## 2019-05-01 NOTE — Telephone Encounter (Signed)
Order for metoprolol refill placed.

## 2019-05-04 ENCOUNTER — Other Ambulatory Visit: Payer: Self-pay | Admitting: Cardiology

## 2019-05-04 NOTE — Telephone Encounter (Signed)
New Message       *STAT* If patient is at the pharmacy, call can be transferred to refill team.   1. Which medications need to be refilled? (please list name of each medication and dose if known) metoprolol succinate (TOPROL-XL) 50 MG 24 hr tablet  2. Which pharmacy/location (including street and city if local pharmacy) is medication to be sent to? [facility] is [place]   3. Do they need a 30 day or 90 day supply? 90    Pt is out of medication

## 2019-05-15 DIAGNOSIS — E785 Hyperlipidemia, unspecified: Secondary | ICD-10-CM | POA: Diagnosis not present

## 2019-05-15 DIAGNOSIS — E1159 Type 2 diabetes mellitus with other circulatory complications: Secondary | ICD-10-CM | POA: Diagnosis not present

## 2019-05-22 DIAGNOSIS — I10 Essential (primary) hypertension: Secondary | ICD-10-CM | POA: Diagnosis not present

## 2019-05-22 DIAGNOSIS — E1159 Type 2 diabetes mellitus with other circulatory complications: Secondary | ICD-10-CM | POA: Diagnosis not present

## 2019-05-22 DIAGNOSIS — E1129 Type 2 diabetes mellitus with other diabetic kidney complication: Secondary | ICD-10-CM | POA: Diagnosis not present

## 2019-05-22 DIAGNOSIS — E1169 Type 2 diabetes mellitus with other specified complication: Secondary | ICD-10-CM | POA: Diagnosis not present

## 2019-09-26 DIAGNOSIS — E1169 Type 2 diabetes mellitus with other specified complication: Secondary | ICD-10-CM | POA: Diagnosis not present

## 2019-10-12 DIAGNOSIS — I160 Hypertensive urgency: Secondary | ICD-10-CM | POA: Diagnosis not present

## 2019-10-12 DIAGNOSIS — R0602 Shortness of breath: Secondary | ICD-10-CM | POA: Diagnosis not present

## 2019-10-16 ENCOUNTER — Other Ambulatory Visit: Payer: Self-pay

## 2019-10-16 DIAGNOSIS — E785 Hyperlipidemia, unspecified: Secondary | ICD-10-CM | POA: Insufficient documentation

## 2019-10-16 DIAGNOSIS — Z9289 Personal history of other medical treatment: Secondary | ICD-10-CM | POA: Insufficient documentation

## 2019-10-16 DIAGNOSIS — M1990 Unspecified osteoarthritis, unspecified site: Secondary | ICD-10-CM | POA: Insufficient documentation

## 2019-10-16 DIAGNOSIS — E119 Type 2 diabetes mellitus without complications: Secondary | ICD-10-CM | POA: Insufficient documentation

## 2019-10-17 ENCOUNTER — Ambulatory Visit (INDEPENDENT_AMBULATORY_CARE_PROVIDER_SITE_OTHER): Payer: 59 | Admitting: Cardiology

## 2019-10-17 ENCOUNTER — Other Ambulatory Visit: Payer: Self-pay

## 2019-10-17 ENCOUNTER — Encounter: Payer: Self-pay | Admitting: Cardiology

## 2019-10-17 VITALS — BP 193/82 | HR 64 | Ht 66.0 in | Wt 163.3 lb

## 2019-10-17 DIAGNOSIS — I77810 Thoracic aortic ectasia: Secondary | ICD-10-CM

## 2019-10-17 DIAGNOSIS — I712 Thoracic aortic aneurysm, without rupture, unspecified: Secondary | ICD-10-CM

## 2019-10-17 DIAGNOSIS — E119 Type 2 diabetes mellitus without complications: Secondary | ICD-10-CM

## 2019-10-17 DIAGNOSIS — E088 Diabetes mellitus due to underlying condition with unspecified complications: Secondary | ICD-10-CM

## 2019-10-17 DIAGNOSIS — I10 Essential (primary) hypertension: Secondary | ICD-10-CM | POA: Diagnosis not present

## 2019-10-17 DIAGNOSIS — I25709 Atherosclerosis of coronary artery bypass graft(s), unspecified, with unspecified angina pectoris: Secondary | ICD-10-CM

## 2019-10-17 DIAGNOSIS — E782 Mixed hyperlipidemia: Secondary | ICD-10-CM | POA: Diagnosis not present

## 2019-10-17 MED ORDER — CHLORTHALIDONE 25 MG PO TABS: 25.0000 mg | ORAL_TABLET | Freq: Every day | ORAL | 3 refills | Status: DC

## 2019-10-17 NOTE — Patient Instructions (Addendum)
Medication Instructions:  Your physician has recommended you make the following change in your medication:   Stop Benicar Start Chlorthalidone 25 mg daily.  *If you need a refill on your cardiac medications before your next appointment, please call your pharmacy*  Su mdico le recomend realizar el siguiente cambio en su medicacin:  Detener Benicar Comience con clortalidona 25 mg al [employer].  * Si necesita reabastecer sus medicamentos para el corazn antes de su prxima cita, llame a su farmacia *  Lab Work: Your physician recommends that you return for lab work in: 1 week You need to have labs done when you are fasting.  You can come Monday through Friday 8:30 am to 12:00 pm and 1:15 to 4:30. You do not need to make an appointment as the order has already been placed. You will have a BMET done.  If you have labs (blood work) drawn today and your tests are completely normal, you will receive your results only by: Marland Kitchen MyChart Message (if you have MyChart) OR . A paper copy in the mail If you have any lab test that is abnormal or we need to change your treatment, we will call you to review the results.  Su mdico le recomienda que regrese para los [employer] de laboratorio en: 1 semana Necesita hacerse anlisis de laboratorio cuando est en [place]. Puede venir de lunes a viernes de 8:30 am a 12:00 pm y de 1:15 a 4:30 pm. No es necesario concertar una cita ya que el pedido ya se ha realizado. Le harn un BMET.  Si le [employer] de laboratorio (anlisis de [place]) hoy y sus pruebas son completamente normales, recibir sus resultados solo por: Mensaje de MyChart (si tiene MyChart) Cheron Schaumann copia en papel por correo Si tiene alguna prueba de laboratorio que sea anormal o si necesitamos cambiar su tratamiento, lo llamaremos para [employer].  Testing/Procedures: Your physician has requested that you have an echocardiogram. Echocardiography is a painless test that uses sound waves to  create images of your heart. It provides your doctor with information about the size and shape of your heart and how well your heart's chambers and valves are working. This procedure takes approximately one hour. There are no restrictions for this procedure.   Su mdico le ha Dentist. La ecocardiografa es una prueba indolora que utiliza ondas sonoras para crear imgenes de su corazn. Le brinda a su mdico informacin sobre el tamao y la forma de su corazn y qu tan bien estn funcionando las cmaras y vlvulas de su corazn. Este procedimiento dura aproximadamente una hora. No hay restricciones para este procedimiento.  Follow-Up: At [facility], you and your health needs are our priority.  As part of our continuing mission to provide you with exceptional heart care, we have created designated Provider Care Teams.  These Care Teams include your primary Cardiologist (physician) and Advanced Practice Providers (APPs -  Physician Assistants and Nurse Practitioners) who all work together to provide you with the care you need, when you need it.  We recommend signing up for the patient portal called "MyChart".  Sign up information is provided on this After Visit Summary.  MyChart is used to connect with patients for Virtual Visits (Telemedicine).  Patients are able to view lab/test results, encounter notes, upcoming appointments, etc.  Non-urgent messages can be sent to your provider as well.   To learn more about what you can do with MyChart, go to [email or web address].    Your  next appointment:   1 month(s)  The format for your next appointment:   In Person  Provider:   Belva Crome, MD   Other Instructions  Hoja de registro de la presin arterial Blood Pressure Record Sheet Para tomar su presin arterial, necesitar un aparato de medicin de la presin arterial. Puede comprar un aparato de medicin de la presin arterial (tensimetro) en la clnica, farmacia o  en lnea. Al elegir Juanna Cao, considere lo siguiente:  Un tensimetro automtico que tenga un brazalete.  Un brazalete que envuelva ceidamente la parte superior de su brazo. Debe poder meter nicamente un dedo entre el brazalete y Cabin crew.  Un dispositivo que [employer] Corporation de la lectura de la presin arterial.  No escoja un tensimetro que mida su presin arterial en la mueca o el dedo. Siga las instrucciones de su mdico con respecto a cmo medir la presin arterial. Para usar este formulario:  Tome una lectura matutina 1 a 2 horas despus de Warehouse manager.  Realice una lectura a la noche (p.m.) antes de la cena.  Realice al menos 2 lecturas con cada verificacin de la presin arterial. Esto permite asegurarse de Apple Computer sean correctos. Espere de 1 a entre una medicin y Photographer.  Anote los [employer] espacios proporcionados en este formulario.  Repita esto una vez por semana, o como se lo haya indicado su mdico.  Concurra a una cita de seguimiento con su mdico para [employer]. Registro de la presin arterial Fecha: _______________________  a.m. _____________________(1 Leata Mouse) _____________________(2 Leata Mouse)  p.m. _____________________(1 Leata Mouse) _____________________(2 Leata Mouse) Franco Nones: _______________________  a.m. _____________________(1 Leata Mouse) _____________________(2 Leata Mouse)  p.m. _____________________(1 Leata Mouse) _____________________(2 Leata Mouse) Franco Nones: _______________________  a.m. _____________________(1 Leata Mouse) _____________________(2 Leata Mouse)  p.m. _____________________(1 Leata Mouse) _____________________(2 Leata Mouse) Franco Nones: _______________________  a.m. _____________________(1 Leata Mouse) _____________________(2 Leata Mouse)  p.m. _____________________(1 Leata Mouse) _____________________(2 Leata Mouse) Franco Nones: _______________________  a.m. _____________________(1 Leata Mouse) _____________________(2  Leata Mouse)  p.m. _____________________(1 Leata Mouse) _____________________(2 Leata Mouse) Esta informacin no tiene como fin reemplazar el consejo del mdico. Asegrese de hacerle al mdico cualquier pregunta que tenga. Document Revised: 02/09/2017 Document Reviewed: 02/09/2017 Elsevier Patient Education  2020 Elsevier Inc.  Chlorthalidone Oral Tablets Qu es este medicamento? La CLORTALIDONA es un diurtico. Ayuda a incrementar el volumen de [place], lo que provoca que el cuerpo pierda sal y agua que est en exceso. Este medicamento se [place] para tratar la alta presin sangunea, edema o la retencin de agua. Este medicamento puede ser utilizado para otros usos; si tiene alguna pregunta consulte con su proveedor de atencin mdica o con su Social research officer, government. MARCAS COMUNES: Thalitone Qu le debo informar a mi profesional de la salud antes de tomar este medicamento? Necesita saber si usted presenta alguno de los [employer] o situaciones:  asma  diabetes  gota  enfermedad renal  enfermedad heptica  enfermedad de las paratiroides  lupus eritematoso sistmico (LES)  si toma cortisona, digoxina, carbonato de litio o medicamentos para la diabetes  una reaccin alrgica o inusual a la clortalidona, sulfonamidas, o medicamentos, alimentos, colorantes o conservantes  si est embarazada o buscando quedar embarazada  si est amamantando a un beb Cmo debo utilizar este medicamento? Tome este medicamento por va oral con un vaso de agua. Siga las instrucciones de la etiqueta del [place]. Es mejor tomar su dosis con alimentos por la [employer]. Tome sus dosis a intervalos regulares. No tome su medicamento con una frecuencia mayor que la indicada. No deje de tomarlo excepto si as lo indica su mdico. Hable con su  pediatra para informarse acerca del uso de este medicamento en nios. Puede requerir atencin especial. Sobredosis: Pngase en contacto inmediatamente con un centro  toxicolgico o una sala de urgencia si usted cree que haya tomado demasiado medicamento. ATENCIN: [employer] es solo para usted. No comparta este medicamento con nadie. Qu sucede si me olvido de una dosis? Si olvida una dosis, tmela lo antes posible. Si es casi la hora de la prxima dosis, tome slo esa dosis. No tome dosis adicionales o dobles. Qu puede interactuar con este medicamento?  barbitricos para el sueo o para controlar convulsiones  digoxina  litio  medicamentos para la diabetes  norepinefrina  otros medicamentos para la alta presin sangunea  algunos analgsicos  hormonas esteroideas, tales como la prednisona, la cortisona, la hidrocortisona o la corticotropina  tubocurarina Puede ser que esta lista no menciona todas las posibles interacciones. Informe a su profesional de [employer] de [employer] productos a base de hierbas, medicamentos de [place] o suplementos nutritivos que est tomando. Si usted fuma, consume bebidas alcohlicas o si utiliza drogas ilegales, indqueselo tambin a su profesional de [employer]. Algunas sustancias pueden interactuar con su medicamento. A qu debo estar atento al usar PPL Corporation? Visite a su mdico o a su profesional de la salud para chequear su evolucin peridicamente. Controle su presin sangunea regularmente. Pregunte a su mdico o a su profesional de la salud cul debe ser su presin sangunea y cundo deber comunicarse con l/ella. Puede necesitar seguir una dieta especial mientras est tomando este medicamento. Consulte a su mdico acerca de esto. Puede experimentar somnolencia o mareos. No conduzca ni utilice maquinaria, ni haga nada que Scientist, research (life sciences) en estado de alerta hasta que sepa cmo le afecta este medicamento. No se siente ni se ponga de pie con rapidez, especialmente si es un paciente de edad avanzada. Esto reduce el riesgo de mareos o [employer]. El alcohol puede interferir con el efecto de [address] Evite consumir bebidas alcohlicas. Este medicamento puede afectar su nivel de Banker. Si es diabtico, consulte a su mdico o su profesional de la salud antes de Multimedia programmer la dosis de su medicamento para la diabetes. Este medicamento puede aumentar la sensibilidad al sol. Mantngase fuera de Secretary/administrator. Si no lo puede evitar, utilice ropa protectora y crema de Orthoptist. No utilice lmparas solares, camas solares ni cabinas solares. Qu efectos secundarios puedo tener al [employer] este medicamento? Efectos secundarios que debe informar a su mdico o a Producer, television/film/video de la salud tan pronto como sea posible:  Therapist, art como erupcin cutnea, picazn o urticarias, hinchazn de la cara, labios o lengua  orina de color amarillo oscuro  boca seca  sed excesiva  pulso cardaco rpido o irregular  fiebre, escalofros  dolor, calambre o espasmo muscular  nuseas, vmito  enrojecimiento, formacin de ampollas, descamacin o aflojamiento de la piel, inclusive dentro de la boca  hormigueo, Engineer, mining o entumecimiento de las manos o los pies  cansancio o debilidad inusual  color amarillento de los ojos o la piel Efectos secundarios que, por lo general, no requieren Psychologist, prison and probation services (debe informarlos a su mdico o a su profesional de la salud si persisten o si son molestos):  diarrea o estreimiento  dolor de cabeza  impotencia  prdida del apetito  Programme researcher, broadcasting/film/video Puede ser que esta lista no menciona todos los posibles efectos secundarios. Comunquese a su mdico por asesoramiento mdico [employer]lett[employer]ard. Usted puede informar  los efectos secundarios a la FDA por telfono al 1-800-FDA-1088. Dnde debo guardar mi medicina? Mantngala fuera del alcance de los nios. Gurdela a Sanmina-SCItemperatura ambiente, entre 15 y 30 grados C (4359 y 10086 grados F). Mantenga el envase bien cerrado. Deseche todo el medicamento que no haya utilizado, despus de  la fecha de vencimiento. ATENCIN: Este folleto es un resumen. Puede ser que no cubra toda la posible informacin. Si usted tiene preguntas acerca de esta medicina, consulte con su mdico, su farmacutico o su profesional de Radiographer, therapeuticla salud.  2020 Elsevier/Gold Standard (2014-02-19 00:00:00)  Ecocardiograma Echocardiogram Un ecocardiograma es un procedimiento que [place] ondas sonoras indoloras (ultrasonido) para obtener una imagen del corazn. Las imgenes de un ecocardiograma pueden proporcionar informacin importante sobre lo siguiente:  Signos de arteriopata coronaria (EAC).  Signos de aneurisma. Un aneurisma es una zona debilitada o daada de la pared de una arteria que se abulta por la fuerza normal del bombeo de la sangre a travs del cuerpo.  Tamao y forma del corazn. Los Danaher Corporationcambios en el tamao y la forma del corazn se pueden asociar a determinadas afecciones, como insuficiencia cardaca, aneurisma y [place] CountryEAC.  Funcin del msculo cardaco.  Funcin de la vlvula cardaca.  Signos de un infarto de miocardio previo.  Acumulacin de lquido alrededor del corazn.  Engrosamiento del msculo cardaco.  Un tumor o crecimiento infeccioso alrededor de las vlvulas cardacas. Informe al mdico acerca de lo siguiente:  Cualquier alergia que tenga.  Todos los Ches[place]ake Energymedicamentos que consume, incluidos vitaminas, hierbas, gotas oftlmicas, cremas y [address]icamentos de [address] libre.  Cualquier enfermedad de la sangre que tenga.  Cirugas a las que se someti.  Cualquier enfermedad que tenga.  Si est embarazada o podra estarlo. Cules son los riesgos? En general, se trata de un procedimiento seguro. Sin embargo, pueden ocurrir complicaciones, por ejemplo:  Reaccin alrgica al tinte de contraste utilizado en el procedimiento. Qu ocurre antes del procedimiento? No se requiere Lobbyistuna preparacin especfica. Podr comer y beber normalmente. Qu ocurre durante el procedimiento?   Pueden colocarle un  tubo (catter) intravenoso en una de las venas.  Puede recibir Arts development officercontraste a travs de esta va. Un contraste es una inyeccin que mejora la calidad de las imgenes del corazn.  Le aplicarn un gel sobre el pecho.  Le pasarn un instrumento similar a una vara (transductor) sobre el pecho. El gel ayudar a transmitir las Corning Incorporatedondas sonoras del transductor.  Las ondas sonoras rebotarn inofensivamente en el corazn para permitir capturar las imgenes del corazn en movimiento, en tiempo real. Las imgenes se registrarn en una computadora. Este procedimiento puede variar segn el mdico y el hospital. Ladell HeadsQu sucede despus del procedimiento?  Puede retomar su rutina diaria normal, incluidos la dieta, las actividades y Pulte Homeslos medicamentos, a menos que su mdico le indique lo contrario. Resumen  Un ecocardiograma es un procedimiento que [place] ondas sonoras indoloras (ultrasonido) para obtener una imagen del corazn.  Las imgenes de un ecocardiograma pueden brindar informacin importante sobre el tamao y la forma del corazn, la funcin del msculo cardaco, la funcin de la vlvula cardaca y la acumulacin de lquido alrededor del Programmer, multimediacorazn.  No es necesario prepararse para este procedimiento. Podr comer y beber normalmente.  Al finalizar el ecocardiograma, puede retomar su rutina diaria normal, a menos que su mdico le indique lo contrario. Esta informacin no tiene Theme park managercomo fin reemplazar el consejo del mdico. Asegrese de hacerle al mdico cualquier pregunta que tenga. Document Revised: 10/07/2016 Document Reviewed: 04/23/2016 Elsevier Patient Education  2020 Elsevier Inc.   

## 2019-10-17 NOTE — Progress Notes (Signed)
Cardiology Office Note:    Date:  10/17/2019   ID:  Mary [name], DOB Aug 12, 1940, MRN [id number]  PCP:  Charlott Rakes, MD  Cardiologist:  Garwin Brothers, MD   Referring MD: Charlott Rakes, MD    ASSESSMENT:    1. Essential (primary) hypertension   2. Coronary artery disease involving coronary bypass graft of native heart with angina pectoris (HCC)   3. Mixed hyperlipidemia   4. Diabetes mellitus without complication (HCC)   5. Diabetes mellitus due to underlying condition with unspecified complications (HCC)    PLAN:    In order of problems listed above:  1. Coronary artery disease: Secondary prevention stressed with the patient.  Importance of compliance with diet medication stressed any vocalized understanding. 2. Essential hypertension: Blood pressure is elevated.  Diet was emphasized and lifestyle modification also.  I started on chlorthalidone 25 mg daily and she will keep a track of her blood pressures.  She will bring the log back to me in 1 week at which point we will do a Chem-7.  Potassium supplementation and diet was urged. Diabetes mellitus: Diet was emphasized. Mixed dyslipidemia: Diet was emphasized.  She is on statin medications lipids are followed by primary care physician. She will be seen follow-up appointment in a month or earlier if she has any concerns.   Medication Adjustments/Labs and Tests Ordered: Current medicines are reviewed at length with the patient today.  Concerns regarding medicines are outlined above.  No orders of the defined types were placed in this encounter.  No orders of the defined types were placed in this encounter.    No chief complaint on file.    History of Present Illness:    Mary [name] is a 79 y.o. female.  Patient has past medical history of coronary artery disease, essential hypertension dyslipidemia and diabetes mellitus.  I have not seen her in follow-up for more than a year.  [place].  She  went to the hospital with elevated blood pressure.  She was treated and released and seen here in follow-up.  I reviewed [place] hospital records extensively.  She is accompanied by her son.  At the time of my evaluation, the patient is alert awake oriented and in no distress.  Past Medical History:  Diagnosis Date  . Arthritis   . CAD in native artery 09/18/2014  . Cellulitis 04/06/2014   Distal right lower extremity   . Coronary artery disease   . Coronary artery disease involving coronary bypass graft of native heart with angina pectoris (HCC) 07/31/2018  . Coronavirus infection 10/10/2018   Formatting of this note might be different from the original. This patient [facility] on 10/07/2018 where she was tested for COVID-19. The patient's POS COVID-19 test resulted on 10/10/2018. The patient's granddaughter, on HIPAA, was contacted by Alfonzo Beers PA-C for notification on 10/10/2018 at 0935 hr.  Granddaughter reports patient lives at home w  . Diabetes mellitus due to underlying condition with unspecified complications (HCC) 04/06/2014  . Diabetes mellitus without complication (HCC)    DM II  . Dyslipidemia 09/18/2014  . Essential (primary) hypertension 09/18/2014  . Fever 04/06/2014  . H/O coronary artery bypass surgery 09/24/2014  . H/O transfusion   . Hyperlipemia   . Hyponatremia 04/06/2014  . Hypoxia 04/06/2014  . Primary osteoarthritis of knee 06/02/2015  . Primary osteoarthritis of right knee 03/29/2014    Past Surgical History:  Procedure Laterality Date  .  BACK SURGERY  1992  . CARDIAC CATHETERIZATION    . CARPAL TUNNEL RELEASE Left   . COLONOSCOPY    . CORONARY ARTERY BYPASS GRAFT  11/10/1994   x 3  . EYE SURGERY Bilateral    Glaucoma  . TOTAL KNEE ARTHROPLASTY Right 04/01/2014   Procedure: RIGHT TOTAL KNEE ARTHROPLASTY;  Surgeon: Gean Birchwood, MD;  Location: [facility] OR;  Service: Orthopedics;  Laterality: Right;  . TOTAL KNEE ARTHROPLASTY  Left 06/02/2015   Procedure: LEFT TOTAL KNEE ARTHROPLASTY;  Surgeon: Gean Birchwood, MD;  Location: [facility] OR;  Service: Orthopedics;  Laterality: Left;  . TUBAL LIGATION      Current Medications: Current Meds  Medication Sig  . aspirin EC 81 MG tablet Take 81 mg by mouth daily.  . cloNIDine (CATAPRES) 0.1 MG tablet Take 0.1 mg by mouth 2 (two) times daily as needed.  . clopidogrel (PLAVIX) 75 MG tablet Take 75 mg by mouth daily.  Marland Kitchen esomeprazole (NEXIUM) 40 MG capsule Take 40 mg by mouth daily.  . fenofibrate (TRICOR) 145 MG tablet Take 145 mg by mouth daily.  . isosorbide mononitrate (IMDUR) 60 MG 24 hr tablet Take 60 mg by mouth.  Marland Kitchen JANUMET XR 50-1000 MG TB24 Take 1 tablet by mouth daily.   . metoprolol succinate (TOPROL-XL) 50 MG 24 hr tablet Take 1 tablet (50 mg total) by mouth daily. Take with or immediately following a meal.  . nitroGLYCERIN (NITROSTAT) 0.4 MG SL tablet Place 1 tablet (0.4 mg total) under the tongue every 5 (five) minutes as needed for chest pain.  Marland Kitchen olmesartan (BENICAR) 40 MG tablet Take 40 mg by mouth daily.  . ranolazine (RANEXA) 1000 MG SR tablet Take 1 tablet (1,000 mg total) by mouth 2 (two) times daily.  . rosuvastatin (CRESTOR) 10 MG tablet Take 10 mg by mouth daily.  . valsartan (DIOVAN) 160 MG tablet Take 160 mg by mouth daily.     Allergies:   Patient has no known allergies.   Social History   Socioeconomic History  . Marital status: Single    Spouse name: Not on file  . Number of children: Not on file  . Years of education: Not on file  . Highest education level: Not on file  Occupational History  . Not on file  Tobacco Use  . Smoking status: Former Smoker    Packs/day: 0.50    Years: 5.00    Pack years: 2.50    Quit date: 04/05/1985    Years since quitting: 34.5  . Smokeless tobacco: Never Used  Vaping Use  . Vaping Use: Never used  Substance and Sexual Activity  . Alcohol use: No  . Drug use: No  . Sexual activity: Never  Other Topics  Concern  . Not on file  Social History Narrative  . Not on file   Social Determinants of Health   Financial Resource Strain:   . Difficulty of Paying Living Expenses: Not on file  Food Insecurity:   . Worried About Programme researcher, broadcasting/film/video in the Last Year: Not on file  . Ran Out of Food in the Last Year: Not on file  Transportation Needs:   . Lack of Transportation (Medical): Not on file  . Lack of Transportation (Non-Medical): Not on file  Physical Activity:   . Days of Exercise per Week: Not on file  . Minutes of Exercise per Session: Not on file  Stress:   . Feeling of Stress : Not on file  Social Connections:   .  Frequency of Communication with Friends and Family: Not on file  . Frequency of Social Gatherings with Friends and Family: Not on file  . Attends Religious Services: Not on file  . Active Member of Clubs or Organizations: Not on file  . Attends Banker Meetings: Not on file  . Marital Status: Not on file     Family History: The patient's family history includes Diabetes in her father; Heart Problems in her mother; High Cholesterol in her mother; Hypertension in her brother; Stroke in her mother.  ROS:   Please see the history of present illness.    All other systems reviewed and are negative.  EKGs/Labs/Other Studies Reviewed:    The following studies were reviewed today: I discussed my findings with the patient at length.   Recent Labs: No results found for requested labs within last 8760 hours.  Recent Lipid Panel No results found for: CHOL, TRIG, HDL, CHOLHDL, VLDL, LDLCALC, LDLDIRECT  Physical Exam:    VS:  BP (!) 193/82   Pulse 64   Ht 5\' 6"  (1.676 m)   Wt 163 lb 4.8 oz (74.1 kg)   SpO2 96%   BMI 26.36 kg/m     Wt Readings from Last 3 Encounters:  10/17/19 163 lb 4.8 oz (74.1 kg)  07/31/18 165 lb (74.8 kg)  01/06/18 165 lb (74.8 kg)     GEN: Patient is in no acute distress HEENT: Normal NECK: No JVD; No carotid  bruits LYMPHATICS: No lymphadenopathy CARDIAC: Hear sounds regular, 2/6 systolic murmur at the apex. RESPIRATORY:  Clear to auscultation without rales, wheezing or rhonchi  ABDOMEN: Soft, non-tender, non-distended MUSCULOSKELETAL:  No edema; No deformity  SKIN: Warm and dry NEUROLOGIC:  Alert and oriented x 3 PSYCHIATRIC:  Normal affect   Signed, 01/08/18, MD  10/17/2019 1:16 PM    [facility]

## 2019-10-18 ENCOUNTER — Ambulatory Visit (INDEPENDENT_AMBULATORY_CARE_PROVIDER_SITE_OTHER): Payer: 59

## 2019-10-18 DIAGNOSIS — I25709 Atherosclerosis of coronary artery bypass graft(s), unspecified, with unspecified angina pectoris: Secondary | ICD-10-CM | POA: Diagnosis not present

## 2019-10-18 NOTE — Progress Notes (Signed)
Complete echocardiogram performed.  Jimmy Orpheus Hayhurst RDCS, RVT  

## 2019-10-19 ENCOUNTER — Telehealth: Payer: Self-pay | Admitting: Cardiology

## 2019-10-19 NOTE — Telephone Encounter (Signed)
Pt son says BP this morning an hour after taking chlorthalidone 178/108 (did not check HR)- pt denies any symptoms at this time - started chlorthalidone 10/6 - pt son made aware that BP meds may take a few day to a week to be effective and to continue to monitor BP daily check at same time daily at least an hour after taking BP medications and to monitor salt intake  - will call back if symptoms occur or if BP doesn't decrease - has f/u in November

## 2019-10-19 NOTE — Telephone Encounter (Signed)
Pt son made aware and will call back Monday

## 2019-10-19 NOTE — Telephone Encounter (Signed)
  Pt c/o BP issue: STAT if pt c/o blurred vision, one-sided weakness or slurred speech  1. What are your last 5 BP readings? This morning 188, this afternoon 178, 205 yesterday  2. Are you having any other symptoms (ex. Dizziness, headache, blurred vision, passed out)? no  3. What is your BP issue? Patient's son states the patient was put on a new BP medication two days ago and her BP is still high. He would like to know how long they can expect the medication to take to work.

## 2019-10-19 NOTE — Telephone Encounter (Signed)
Keep track of blood pressures for the weekend and let us know on Monday.  Low-salt diet.

## 2019-10-22 LAB — ECHOCARDIOGRAM COMPLETE
Area-P 1/2: 2.22 cm2
S' Lateral: 3.5 cm

## 2019-10-22 NOTE — Telephone Encounter (Signed)
Increase clonidine 2.2 mg twice daily and keep a track of blood pressures and let us know end of the week.  Chem-7 needed.  Copy primary care

## 2019-10-22 NOTE — Telephone Encounter (Signed)
Called patient son back per dpr. Below are readings.   10/17/19   168/124 10/18/19   167/124   195/131 10/19/19    198/158   174/100 10/20/19    178/140    185/81 10/21/19   204/109    172/117 10/22/19 175/116  Patient denies headaches, blurred vision or dizziness. She does complain of feeling hot.

## 2019-10-22 NOTE — Telephone Encounter (Signed)
   Gerilyn Nestle is calling back to give pt's BP reading. He wants to give it to Dr. Kem Parkinson nurse

## 2019-10-23 LAB — BASIC METABOLIC PANEL
BUN/Creatinine Ratio: 25 (ref 12–28)
BUN: 26 mg/dL (ref 8–27)
CO2: 27 mmol/L (ref 20–29)
Calcium: 10.1 mg/dL (ref 8.7–10.3)
Chloride: 96 mmol/L (ref 96–106)
Creatinine, Ser: 1.02 mg/dL — ABNORMAL HIGH (ref 0.57–1.00)
GFR calc Af Amer: 61 mL/min/{1.73_m2} (ref >59)
GFR calc non Af Amer: 53 mL/min/{1.73_m2} — ABNORMAL LOW (ref >59)
Glucose: 177 mg/dL — ABNORMAL HIGH (ref 65–99)
Potassium: 3.9 mmol/L (ref 3.5–5.2)
Sodium: 138 mmol/L (ref 134–144)

## 2019-10-23 MED ORDER — CLONIDINE HCL 0.2 MG PO TABS: 0.2000 mg | ORAL_TABLET | Freq: Two times a day (BID) | ORAL | 1 refills | Status: DC

## 2019-10-23 NOTE — Addendum Note (Signed)
Addended by: Eleonore Chiquito on: 10/23/2019 08:23 AM   Modules accepted: Orders

## 2019-10-23 NOTE — Telephone Encounter (Signed)
Spoke with the patients son and gave recommendations as per Dr. Tomie China. Pt's son verbalized understanding and had no additional questions.

## 2019-10-23 NOTE — Addendum Note (Signed)
Addended by: Eleonore Chiquito on: 10/23/2019 10:53 AM   Modules accepted: Orders

## 2019-10-24 ENCOUNTER — Telehealth: Payer: Self-pay

## 2019-10-24 NOTE — Telephone Encounter (Signed)
Mary [name]  Per DPR advised that the pt has an appointment at [facility] tomorrow for a 1:30 CT scan. Pt needs to arrive at 1:00. Mary [name] verbalized understanding.

## 2019-10-25 ENCOUNTER — Encounter: Payer: Self-pay | Admitting: Cardiology

## 2019-11-08 ENCOUNTER — Telehealth: Payer: Self-pay

## 2019-11-08 NOTE — Telephone Encounter (Signed)
Spoke with Gerilyn Nestle per DPR with results of [facility]. Results reviewed with pt as per Dr. Kem Parkinson note.  Pt verbalized understanding and had no additional questions.

## 2019-11-12 ENCOUNTER — Other Ambulatory Visit: Payer: Self-pay | Admitting: Cardiology

## 2019-11-12 MED ORDER — METOPROLOL SUCCINATE ER 50 MG PO TB24: 50.0000 mg | ORAL_TABLET | Freq: Every day | ORAL | 3 refills | Status: DC

## 2019-11-12 MED ORDER — RANOLAZINE ER 1000 MG PO TB12
1000.0000 mg | ORAL_TABLET | Freq: Two times a day (BID) | ORAL | 3 refills | Status: DC
Start: 2019-11-12 — End: 2022-03-22

## 2019-11-12 MED ORDER — ISOSORBIDE MONONITRATE ER 60 MG PO TB24: 60.0000 mg | ORAL_TABLET | Freq: Every day | ORAL | 3 refills | Status: DC

## 2019-11-12 NOTE — Telephone Encounter (Signed)
Refill sent in per request.  

## 2019-11-12 NOTE — Telephone Encounter (Signed)
*  STAT* If patient is at the pharmacy, call can be transferred to refill team.   1. Which medications need to be refilled? (please list name of each medication and dose if known)  ranolazine (RANEXA) 1000 MG SR tablet [[id number]]  isosorbide mononitrate (IMDUR) 60 MG 24 hr tablet  metoprolol succinate (TOPROL-XL) 50 MG 24 hr tablet    2. Which pharmacy/location (including street and city if local pharmacy) is medication to be sent to?  [address] [place], [place] - [address]  [address], [place] [address]  Phone:  [phone] Fax:  [phone]-  3. Do they need a 30 day or 90 day supply?  90  Pt has an appt on 11/11 but is completely out off these meds

## 2019-11-21 ENCOUNTER — Other Ambulatory Visit: Payer: Self-pay

## 2019-11-22 ENCOUNTER — Ambulatory Visit (INDEPENDENT_AMBULATORY_CARE_PROVIDER_SITE_OTHER): Payer: 59 | Admitting: Cardiology

## 2019-11-22 ENCOUNTER — Encounter: Payer: Self-pay | Admitting: Cardiology

## 2019-11-22 ENCOUNTER — Other Ambulatory Visit: Payer: Self-pay

## 2019-11-22 VITALS — BP 164/76 | HR 70 | Ht 66.0 in | Wt 163.8 lb

## 2019-11-22 DIAGNOSIS — I10 Essential (primary) hypertension: Secondary | ICD-10-CM

## 2019-11-22 DIAGNOSIS — Z951 Presence of aortocoronary bypass graft: Secondary | ICD-10-CM

## 2019-11-22 DIAGNOSIS — I7121 Aneurysm of the ascending aorta, without rupture: Secondary | ICD-10-CM

## 2019-11-22 DIAGNOSIS — E088 Diabetes mellitus due to underlying condition with unspecified complications: Secondary | ICD-10-CM | POA: Diagnosis not present

## 2019-11-22 DIAGNOSIS — I712 Thoracic aortic aneurysm, without rupture: Secondary | ICD-10-CM

## 2019-11-22 DIAGNOSIS — E782 Mixed hyperlipidemia: Secondary | ICD-10-CM | POA: Diagnosis not present

## 2019-11-22 DIAGNOSIS — I25709 Atherosclerosis of coronary artery bypass graft(s), unspecified, with unspecified angina pectoris: Secondary | ICD-10-CM | POA: Diagnosis not present

## 2019-11-22 HISTORY — DX: Aneurysm of the ascending aorta, without rupture: I71.21

## 2019-11-22 HISTORY — DX: Thoracic aortic aneurysm, without rupture: I71.2

## 2019-11-22 MED ORDER — CLONIDINE HCL 0.2 MG PO TABS
0.2000 mg | ORAL_TABLET | Freq: Two times a day (BID) | ORAL | 3 refills | Status: DC
Start: 2019-11-22 — End: 2020-12-19

## 2019-11-22 MED ORDER — NITROGLYCERIN 0.4 MG SL SUBL: 0.4000 mg | SUBLINGUAL_TABLET | SUBLINGUAL | 11 refills | Status: DC | PRN

## 2019-11-22 NOTE — Progress Notes (Signed)
Cardiology Office Note:    Date:  11/22/2019   ID:  Mary [name], DOB 10-14-1940, MRN [id number]  PCP:  Charlott Rakes, MD  Cardiologist:  Garwin Brothers, MD   Referring MD: Charlott Rakes, MD    ASSESSMENT:    1. Coronary artery disease involving coronary bypass graft of native heart with angina pectoris (HCC)   2. Essential (primary) hypertension   3. Mixed hyperlipidemia   4. Diabetes mellitus due to underlying condition with unspecified complications (HCC)   5. H/O coronary artery bypass surgery   6. Ascending aortic aneurysm (HCC)    PLAN:    In order of problems listed above:  1. Coronary artery disease: Secondary prevention stressed with the patient.  Importance of compliance with diet medication stressed and she vocalized understanding. 2. Essential hypertension: Blood pressure stable.  We will going to try to get a copy of her blood pressure readings from her primary care physician's office.  She will have a Chem-7 today she is on multiple medications including diuretics Diabetes mellitus: Managed by her primary care physician and diet was emphasized Ascending aortic aneurysm and mixed dyslipidemia: I discussed my findings with the patient at extensive length.  Annual imaging was recommended.  Optimization of blood pressure control was recommended also and lipid management is followed by primary care physician.  Patient is on statin therapy. Patient plans to go back to the [place]  and this makes her management a little complex because she does not have a regular follow-up for her blood pressures obtained I suggested to see a primary care care and get established for routine blood pressure monitoring.  She agrees. Patient will be seen in follow-up appointment in 4 months or earlier if the patient has any concerns    Medication Adjustments/Labs and Tests Ordered: Current medicines are reviewed at length with the patient today.  Concerns regarding medicines  are outlined above.  No orders of the defined types were placed in this encounter.  No orders of the defined types were placed in this encounter.    No chief complaint on file.    History of Present Illness:    Mary [name] is a 79 y.o. female.  Patient has past medical history of coronary artery disease, essential hypertension mixed dyslipidemia and diabetes mellitus.  She denies any problems at this time except activities of daily living.  No chest pain orthopnea or PND.  She is keeping a good track of her blood pressures.  Her son accompanies her for this visit and also a IT trainer is present.  She mentions to me that her blood pressure machine connects directly with her primary care doctor and keeps a track of her blood pressures and medications are titrated by her primary care physician.  Her blood pressure medicine this I initiated was doubled by her primary care physician the blood pressure is much better at this time.  At the time of my evaluation, the patient is alert awake oriented and in no distress.  Past Medical History:  Diagnosis Date  . Arthritis   . CAD in native artery 09/18/2014  . Cellulitis 04/06/2014   Distal right lower extremity   . Coronary artery disease   . Coronary artery disease involving coronary bypass graft of native heart with angina pectoris (HCC) 07/31/2018  . Coronavirus infection 10/10/2018   Formatting of this note might be different from the original. This patient presented to the [facility] on 10/07/2018 where she was tested  for COVID-19. The patient's POS COVID-19 test resulted on 10/10/2018. The patient's granddaughter, on HIPAA, was contacted by Alfonzo Beers PA-C for notification on 10/10/2018 at 0935 hr.  Granddaughter reports patient lives at home w  . Diabetes mellitus due to underlying condition with unspecified complications (HCC) 04/06/2014  . Diabetes mellitus without complication (HCC)    DM II  .  Dyslipidemia 09/18/2014  . Essential (primary) hypertension 09/18/2014  . Fever 04/06/2014  . H/O coronary artery bypass surgery 09/24/2014  . H/O transfusion   . Hyperlipemia   . Hyponatremia 04/06/2014  . Hypoxia 04/06/2014  . Primary osteoarthritis of knee 06/02/2015  . Primary osteoarthritis of right knee 03/29/2014    Past Surgical History:  Procedure Laterality Date  . BACK SURGERY  1992  . CARDIAC CATHETERIZATION    . CARPAL TUNNEL RELEASE Left   . COLONOSCOPY    . CORONARY ARTERY BYPASS GRAFT  11/10/1994   x 3  . EYE SURGERY Bilateral    Glaucoma  . TOTAL KNEE ARTHROPLASTY Right 04/01/2014   Procedure: RIGHT TOTAL KNEE ARTHROPLASTY;  Surgeon: Gean Birchwood, MD;  Location: [facility];  Service: Orthopedics;  Laterality: Right;  . TOTAL KNEE ARTHROPLASTY Left 06/02/2015   Procedure: LEFT TOTAL KNEE ARTHROPLASTY;  Surgeon: Gean Birchwood, MD;  Location: [facility] OR;  Service: Orthopedics;  Laterality: Left;  . TUBAL LIGATION      Current Medications: Current Meds  Medication Sig  . aspirin EC 81 MG tablet Take 81 mg by mouth daily.  . chlorthalidone (HYGROTON) 25 MG tablet Take 1 tablet (25 mg total) by mouth daily.  . cloNIDine (CATAPRES) 0.2 MG tablet Take 1 tablet (0.2 mg total) by mouth 2 (two) times daily.  . clopidogrel (PLAVIX) 75 MG tablet Take 75 mg by mouth daily.  Marland Kitchen esomeprazole (NEXIUM) 40 MG capsule Take 40 mg by mouth daily.  . fenofibrate (TRICOR) 145 MG tablet Take 145 mg by mouth daily.  . isosorbide mononitrate (IMDUR) 60 MG 24 hr tablet Take 1 tablet (60 mg total) by mouth daily.  Marland Kitchen JANUMET XR 50-1000 MG TB24 Take 1 tablet by mouth daily.   . metoprolol succinate (TOPROL-XL) 50 MG 24 hr tablet Take 1 tablet (50 mg total) by mouth daily. Take with or immediately following a meal.  . ranolazine (RANEXA) 1000 MG SR tablet Take 1 tablet (1,000 mg total) by mouth 2 (two) times daily.  . rosuvastatin (CRESTOR) 10 MG tablet Take 10 mg by mouth daily.  . valsartan (DIOVAN) 320 MG  tablet Take 320 mg by mouth daily.     Allergies:   Patient has no known allergies.   Social History   Socioeconomic History  . Marital status: Single    Spouse name: Not on file  . Number of children: Not on file  . Years of education: Not on file  . Highest education level: Not on file  Occupational History  . Not on file  Tobacco Use  . Smoking status: Former Smoker    Packs/day: 0.50    Years: 5.00    Pack years: 2.50    Quit date: 04/05/1985    Years since quitting: 34.6  . Smokeless tobacco: Never Used  Vaping Use  . Vaping Use: Never used  Substance and Sexual Activity  . Alcohol use: No  . Drug use: No  . Sexual activity: Never  Other Topics Concern  . Not on file  Social History Narrative  . Not on file   Social Determinants of Health  Financial Resource Strain:   . Difficulty of Paying Living Expenses: Not on file  Food Insecurity:   . Worried About Programme researcher, broadcasting/film/video in the Last Year: Not on file  . Ran Out of Food in the Last Year: Not on file  Transportation Needs:   . Lack of Transportation (Medical): Not on file  . Lack of Transportation (Non-Medical): Not on file  Physical Activity:   . Days of Exercise per Week: Not on file  . Minutes of Exercise per Session: Not on file  Stress:   . Feeling of Stress : Not on file  Social Connections:   . Frequency of Communication with Friends and Family: Not on file  . Frequency of Social Gatherings with Friends and Family: Not on file  . Attends Religious Services: Not on file  . Active Member of Clubs or Organizations: Not on file  . Attends Banker Meetings: Not on file  . Marital Status: Not on file     Family History: The patient's family history includes Diabetes in her father; Heart Problems in her mother; High Cholesterol in her mother; Hypertension in her brother; Stroke in her mother.  ROS:   Please see the history of present illness.    All other systems reviewed and are  negative.  EKGs/Labs/Other Studies Reviewed:    The following studies were reviewed today: I discussed my findings with the patient at extensive length.  CT scan from [facility] was reviewed ectatic ascending aorta 4.4 cm with recommendation for annual follow-up.   Recent Labs: 10/23/2019: BUN 26; Creatinine, Ser 1.02; Potassium 3.9; Sodium 138  Recent Lipid Panel No results found for: CHOL, TRIG, HDL, CHOLHDL, VLDL, LDLCALC, LDLDIRECT  Physical Exam:    VS:  BP (!) 164/76   Pulse 70   Ht 5\' 6"  (1.676 m)   Wt 163 lb 12.8 oz (74.3 kg)   SpO2 93%   BMI 26.44 kg/m     Wt Readings from Last 3 Encounters:  11/22/19 163 lb 12.8 oz (74.3 kg)  10/17/19 163 lb 4.8 oz (74.1 kg)  07/31/18 165 lb (74.8 kg)     GEN: Patient is in no acute distress HEENT: Normal NECK: No JVD; No carotid bruits LYMPHATICS: No lymphadenopathy CARDIAC: Hear sounds regular, 2/6 systolic murmur at the apex. RESPIRATORY:  Clear to auscultation without rales, wheezing or rhonchi  ABDOMEN: Soft, non-tender, non-distended MUSCULOSKELETAL:  No edema; No deformity  SKIN: Warm and dry NEUROLOGIC:  Alert and oriented x 3 PSYCHIATRIC:  Normal affect   Signed, 08/02/18, MD  11/22/2019 1:35 PM    [facility]

## 2019-11-22 NOTE — Patient Instructions (Signed)
Medication Instructions:  No medication changes. *If you need a refill on your cardiac medications before your next appointment, please call your pharmacy*   Lab Work: Your physician recommends that you have a BMET today in the office.  If you have labs (blood work) drawn today and your tests are completely normal, you will receive your results only by: MyChart Message (if you have MyChart) OR A paper copy in the mail If you have any lab test that is abnormal or we need to change your treatment, we will call you to review the results.   Testing/Procedures: None ordered   Follow-Up: At [facility], you and your health needs are our priority.  As part of our continuing mission to provide you with exceptional heart care, we have created designated Provider Care Teams.  These Care Teams include your primary Cardiologist (physician) and Advanced Practice Providers (APPs -  Physician Assistants and Nurse Practitioners) who all work together to provide you with the care you need, when you need it.  We recommend signing up for the patient portal called "MyChart".  Sign up information is provided on this After Visit Summary.  MyChart is used to connect with patients for Virtual Visits (Telemedicine).  Patients are able to view lab/test results, encounter notes, upcoming appointments, etc.  Non-urgent messages can be sent to your provider as well.   To learn more about what you can do with MyChart, go to [email or web address].    Your next appointment:   3 month(s)  The format for your next appointment:   In Person  Provider:   Rajan Revankar, MD   Other Instructions NA  

## 2019-11-23 LAB — BASIC METABOLIC PANEL
BUN/Creatinine Ratio: 27 (ref 12–28)
BUN: 33 mg/dL — ABNORMAL HIGH (ref 8–27)
CO2: 23 mmol/L (ref 20–29)
Calcium: 9.2 mg/dL (ref 8.7–10.3)
Chloride: 98 mmol/L (ref 96–106)
Creatinine, Ser: 1.21 mg/dL — ABNORMAL HIGH (ref 0.57–1.00)
GFR calc Af Amer: 50 mL/min/{1.73_m2} — ABNORMAL LOW (ref >59)
GFR calc non Af Amer: 43 mL/min/{1.73_m2} — ABNORMAL LOW (ref >59)
Glucose: 160 mg/dL — ABNORMAL HIGH (ref 65–99)
Potassium: 4.2 mmol/L (ref 3.5–5.2)
Sodium: 140 mmol/L (ref 134–144)

## 2019-12-10 ENCOUNTER — Other Ambulatory Visit: Payer: Self-pay | Admitting: Cardiology

## 2019-12-10 MED ORDER — CHLORTHALIDONE 25 MG PO TABS: 25.0000 mg | ORAL_TABLET | Freq: Every day | ORAL | 3 refills | Status: DC

## 2019-12-10 MED ORDER — FENOFIBRATE 145 MG PO TABS
145.0000 mg | ORAL_TABLET | Freq: Every day | ORAL | 3 refills | Status: DC
Start: 2019-12-10 — End: 2022-03-22

## 2019-12-10 NOTE — Telephone Encounter (Signed)
*  STAT* If patient is at the pharmacy, call can be transferred to refill team.   1. Which medications need to be refilled? (please list name of each medication and dose if known)  chlorthalidone (HYGROTON) 25 MG tablet fenofibrate (TRICOR) 145 MG tablet  2. Which pharmacy/location (including street and city if local pharmacy) is medication to be sent to? [facility] - [place], [place] - [address]  3. Do they need a 30 day or 90 day supply? 90  [place] going out of [place]

## 2019-12-10 NOTE — Telephone Encounter (Signed)
Refill sent in per request.  

## 2020-01-12 DIAGNOSIS — E1159 Type 2 diabetes mellitus with other circulatory complications: Secondary | ICD-10-CM | POA: Diagnosis not present

## 2020-01-12 DIAGNOSIS — I10 Essential (primary) hypertension: Secondary | ICD-10-CM | POA: Diagnosis not present

## 2020-01-12 DIAGNOSIS — E785 Hyperlipidemia, unspecified: Secondary | ICD-10-CM | POA: Diagnosis not present

## 2020-02-12 DIAGNOSIS — I10 Essential (primary) hypertension: Secondary | ICD-10-CM | POA: Diagnosis not present

## 2020-02-12 DIAGNOSIS — E1159 Type 2 diabetes mellitus with other circulatory complications: Secondary | ICD-10-CM | POA: Diagnosis not present

## 2020-02-12 DIAGNOSIS — E785 Hyperlipidemia, unspecified: Secondary | ICD-10-CM | POA: Diagnosis not present

## 2020-02-20 ENCOUNTER — Other Ambulatory Visit: Payer: Self-pay

## 2020-02-20 DIAGNOSIS — I2510 Atherosclerotic heart disease of native coronary artery without angina pectoris: Secondary | ICD-10-CM | POA: Insufficient documentation

## 2020-02-22 ENCOUNTER — Ambulatory Visit: Payer: Medicare HMO | Admitting: Cardiology

## 2020-03-25 DIAGNOSIS — E785 Hyperlipidemia, unspecified: Secondary | ICD-10-CM | POA: Diagnosis not present

## 2020-03-25 DIAGNOSIS — L98499 Non-pressure chronic ulcer of skin of other sites with unspecified severity: Secondary | ICD-10-CM | POA: Diagnosis not present

## 2020-03-25 DIAGNOSIS — E1169 Type 2 diabetes mellitus with other specified complication: Secondary | ICD-10-CM | POA: Diagnosis not present

## 2020-03-25 DIAGNOSIS — Z6828 Body mass index (BMI) 28.0-28.9, adult: Secondary | ICD-10-CM | POA: Diagnosis not present

## 2020-04-01 DIAGNOSIS — L98499 Non-pressure chronic ulcer of skin of other sites with unspecified severity: Secondary | ICD-10-CM | POA: Diagnosis not present

## 2020-04-01 DIAGNOSIS — Z6828 Body mass index (BMI) 28.0-28.9, adult: Secondary | ICD-10-CM | POA: Diagnosis not present

## 2020-04-07 DIAGNOSIS — E785 Hyperlipidemia, unspecified: Secondary | ICD-10-CM | POA: Diagnosis not present

## 2020-04-07 DIAGNOSIS — I25119 Atherosclerotic heart disease of native coronary artery with unspecified angina pectoris: Secondary | ICD-10-CM | POA: Diagnosis not present

## 2020-04-07 DIAGNOSIS — E1169 Type 2 diabetes mellitus with other specified complication: Secondary | ICD-10-CM | POA: Diagnosis not present

## 2020-04-07 DIAGNOSIS — E1159 Type 2 diabetes mellitus with other circulatory complications: Secondary | ICD-10-CM | POA: Diagnosis not present

## 2020-04-11 DIAGNOSIS — E1165 Type 2 diabetes mellitus with hyperglycemia: Secondary | ICD-10-CM | POA: Diagnosis not present

## 2020-04-11 DIAGNOSIS — L97929 Non-pressure chronic ulcer of unspecified part of left lower leg with unspecified severity: Secondary | ICD-10-CM | POA: Diagnosis not present

## 2020-04-11 DIAGNOSIS — I10 Essential (primary) hypertension: Secondary | ICD-10-CM | POA: Diagnosis not present

## 2020-04-11 DIAGNOSIS — L97909 Non-pressure chronic ulcer of unspecified part of unspecified lower leg with unspecified severity: Secondary | ICD-10-CM | POA: Diagnosis not present

## 2020-04-11 DIAGNOSIS — E119 Type 2 diabetes mellitus without complications: Secondary | ICD-10-CM | POA: Diagnosis not present

## 2020-04-11 DIAGNOSIS — D649 Anemia, unspecified: Secondary | ICD-10-CM | POA: Diagnosis not present

## 2020-04-11 DIAGNOSIS — L97919 Non-pressure chronic ulcer of unspecified part of right lower leg with unspecified severity: Secondary | ICD-10-CM | POA: Diagnosis not present

## 2020-04-11 DIAGNOSIS — Z794 Long term (current) use of insulin: Secondary | ICD-10-CM | POA: Diagnosis not present

## 2020-04-11 DIAGNOSIS — L97829 Non-pressure chronic ulcer of other part of left lower leg with unspecified severity: Secondary | ICD-10-CM | POA: Diagnosis not present

## 2020-04-11 DIAGNOSIS — L97819 Non-pressure chronic ulcer of other part of right lower leg with unspecified severity: Secondary | ICD-10-CM | POA: Diagnosis not present

## 2020-04-22 DIAGNOSIS — E11622 Type 2 diabetes mellitus with other skin ulcer: Secondary | ICD-10-CM | POA: Diagnosis not present

## 2020-04-22 DIAGNOSIS — L97212 Non-pressure chronic ulcer of right calf with fat layer exposed: Secondary | ICD-10-CM | POA: Diagnosis not present

## 2020-04-22 DIAGNOSIS — I872 Venous insufficiency (chronic) (peripheral): Secondary | ICD-10-CM | POA: Diagnosis not present

## 2020-04-22 DIAGNOSIS — L97909 Non-pressure chronic ulcer of unspecified part of unspecified lower leg with unspecified severity: Secondary | ICD-10-CM | POA: Diagnosis not present

## 2020-04-22 DIAGNOSIS — E11621 Type 2 diabetes mellitus with foot ulcer: Secondary | ICD-10-CM | POA: Diagnosis not present

## 2020-04-22 DIAGNOSIS — E119 Type 2 diabetes mellitus without complications: Secondary | ICD-10-CM | POA: Diagnosis not present

## 2020-04-22 DIAGNOSIS — I83012 Varicose veins of right lower extremity with ulcer of calf: Secondary | ICD-10-CM | POA: Diagnosis not present

## 2020-04-22 DIAGNOSIS — L97822 Non-pressure chronic ulcer of other part of left lower leg with fat layer exposed: Secondary | ICD-10-CM | POA: Diagnosis not present

## 2020-04-22 DIAGNOSIS — L97812 Non-pressure chronic ulcer of other part of right lower leg with fat layer exposed: Secondary | ICD-10-CM | POA: Diagnosis not present

## 2020-04-22 DIAGNOSIS — L97222 Non-pressure chronic ulcer of left calf with fat layer exposed: Secondary | ICD-10-CM | POA: Diagnosis not present

## 2020-04-29 DIAGNOSIS — L97222 Non-pressure chronic ulcer of left calf with fat layer exposed: Secondary | ICD-10-CM | POA: Diagnosis not present

## 2020-04-29 DIAGNOSIS — L97812 Non-pressure chronic ulcer of other part of right lower leg with fat layer exposed: Secondary | ICD-10-CM | POA: Diagnosis not present

## 2020-04-29 DIAGNOSIS — L97909 Non-pressure chronic ulcer of unspecified part of unspecified lower leg with unspecified severity: Secondary | ICD-10-CM | POA: Diagnosis not present

## 2020-04-29 DIAGNOSIS — L97212 Non-pressure chronic ulcer of right calf with fat layer exposed: Secondary | ICD-10-CM | POA: Diagnosis not present

## 2020-04-29 DIAGNOSIS — E11622 Type 2 diabetes mellitus with other skin ulcer: Secondary | ICD-10-CM | POA: Diagnosis not present

## 2020-04-29 DIAGNOSIS — I872 Venous insufficiency (chronic) (peripheral): Secondary | ICD-10-CM | POA: Diagnosis not present

## 2020-04-29 DIAGNOSIS — L97822 Non-pressure chronic ulcer of other part of left lower leg with fat layer exposed: Secondary | ICD-10-CM | POA: Diagnosis not present

## 2020-05-06 DIAGNOSIS — L97212 Non-pressure chronic ulcer of right calf with fat layer exposed: Secondary | ICD-10-CM | POA: Diagnosis not present

## 2020-05-06 DIAGNOSIS — L97812 Non-pressure chronic ulcer of other part of right lower leg with fat layer exposed: Secondary | ICD-10-CM | POA: Diagnosis not present

## 2020-05-06 DIAGNOSIS — I872 Venous insufficiency (chronic) (peripheral): Secondary | ICD-10-CM | POA: Diagnosis not present

## 2020-05-06 DIAGNOSIS — L97222 Non-pressure chronic ulcer of left calf with fat layer exposed: Secondary | ICD-10-CM | POA: Diagnosis not present

## 2020-05-06 DIAGNOSIS — E11622 Type 2 diabetes mellitus with other skin ulcer: Secondary | ICD-10-CM | POA: Diagnosis not present

## 2020-05-06 DIAGNOSIS — L97822 Non-pressure chronic ulcer of other part of left lower leg with fat layer exposed: Secondary | ICD-10-CM | POA: Diagnosis not present

## 2020-05-11 DIAGNOSIS — E119 Type 2 diabetes mellitus without complications: Secondary | ICD-10-CM | POA: Diagnosis not present

## 2020-05-11 DIAGNOSIS — I10 Essential (primary) hypertension: Secondary | ICD-10-CM | POA: Diagnosis not present

## 2020-05-20 DIAGNOSIS — L97812 Non-pressure chronic ulcer of other part of right lower leg with fat layer exposed: Secondary | ICD-10-CM | POA: Diagnosis not present

## 2020-05-20 DIAGNOSIS — L97222 Non-pressure chronic ulcer of left calf with fat layer exposed: Secondary | ICD-10-CM | POA: Diagnosis not present

## 2020-05-20 DIAGNOSIS — I83028 Varicose veins of left lower extremity with ulcer other part of lower leg: Secondary | ICD-10-CM | POA: Diagnosis not present

## 2020-05-20 DIAGNOSIS — I872 Venous insufficiency (chronic) (peripheral): Secondary | ICD-10-CM | POA: Diagnosis not present

## 2020-05-20 DIAGNOSIS — L97212 Non-pressure chronic ulcer of right calf with fat layer exposed: Secondary | ICD-10-CM | POA: Diagnosis not present

## 2020-05-20 DIAGNOSIS — L97822 Non-pressure chronic ulcer of other part of left lower leg with fat layer exposed: Secondary | ICD-10-CM | POA: Diagnosis not present

## 2020-05-20 DIAGNOSIS — I83018 Varicose veins of right lower extremity with ulcer other part of lower leg: Secondary | ICD-10-CM | POA: Diagnosis not present

## 2020-05-31 ENCOUNTER — Other Ambulatory Visit: Payer: Self-pay | Admitting: Cardiology

## 2020-06-03 DIAGNOSIS — L97822 Non-pressure chronic ulcer of other part of left lower leg with fat layer exposed: Secondary | ICD-10-CM | POA: Diagnosis not present

## 2020-06-03 DIAGNOSIS — L97212 Non-pressure chronic ulcer of right calf with fat layer exposed: Secondary | ICD-10-CM | POA: Diagnosis not present

## 2020-06-03 DIAGNOSIS — L97222 Non-pressure chronic ulcer of left calf with fat layer exposed: Secondary | ICD-10-CM | POA: Diagnosis not present

## 2020-06-03 DIAGNOSIS — E11622 Type 2 diabetes mellitus with other skin ulcer: Secondary | ICD-10-CM | POA: Diagnosis not present

## 2020-06-03 DIAGNOSIS — I872 Venous insufficiency (chronic) (peripheral): Secondary | ICD-10-CM | POA: Diagnosis not present

## 2020-06-03 DIAGNOSIS — L97812 Non-pressure chronic ulcer of other part of right lower leg with fat layer exposed: Secondary | ICD-10-CM | POA: Diagnosis not present

## 2020-06-11 DIAGNOSIS — E785 Hyperlipidemia, unspecified: Secondary | ICD-10-CM | POA: Diagnosis not present

## 2020-06-11 DIAGNOSIS — E119 Type 2 diabetes mellitus without complications: Secondary | ICD-10-CM | POA: Diagnosis not present

## 2020-06-11 DIAGNOSIS — I10 Essential (primary) hypertension: Secondary | ICD-10-CM | POA: Diagnosis not present

## 2020-06-17 DIAGNOSIS — E11622 Type 2 diabetes mellitus with other skin ulcer: Secondary | ICD-10-CM | POA: Diagnosis not present

## 2020-06-17 DIAGNOSIS — I872 Venous insufficiency (chronic) (peripheral): Secondary | ICD-10-CM | POA: Diagnosis not present

## 2020-06-17 DIAGNOSIS — L97812 Non-pressure chronic ulcer of other part of right lower leg with fat layer exposed: Secondary | ICD-10-CM | POA: Diagnosis not present

## 2020-06-17 DIAGNOSIS — L97822 Non-pressure chronic ulcer of other part of left lower leg with fat layer exposed: Secondary | ICD-10-CM | POA: Diagnosis not present

## 2020-06-17 DIAGNOSIS — L97222 Non-pressure chronic ulcer of left calf with fat layer exposed: Secondary | ICD-10-CM | POA: Diagnosis not present

## 2020-06-17 DIAGNOSIS — L97212 Non-pressure chronic ulcer of right calf with fat layer exposed: Secondary | ICD-10-CM | POA: Diagnosis not present

## 2020-06-19 DIAGNOSIS — E1169 Type 2 diabetes mellitus with other specified complication: Secondary | ICD-10-CM | POA: Diagnosis not present

## 2020-06-25 DIAGNOSIS — I2510 Atherosclerotic heart disease of native coronary artery without angina pectoris: Secondary | ICD-10-CM | POA: Diagnosis not present

## 2020-06-25 DIAGNOSIS — E1129 Type 2 diabetes mellitus with other diabetic kidney complication: Secondary | ICD-10-CM | POA: Diagnosis not present

## 2020-06-25 DIAGNOSIS — N1831 Chronic kidney disease, stage 3a: Secondary | ICD-10-CM | POA: Diagnosis not present

## 2020-06-25 DIAGNOSIS — E782 Mixed hyperlipidemia: Secondary | ICD-10-CM | POA: Diagnosis not present

## 2020-07-11 DIAGNOSIS — E785 Hyperlipidemia, unspecified: Secondary | ICD-10-CM | POA: Diagnosis not present

## 2020-07-11 DIAGNOSIS — E119 Type 2 diabetes mellitus without complications: Secondary | ICD-10-CM | POA: Diagnosis not present

## 2020-07-11 DIAGNOSIS — I10 Essential (primary) hypertension: Secondary | ICD-10-CM | POA: Diagnosis not present

## 2020-08-11 DIAGNOSIS — I10 Essential (primary) hypertension: Secondary | ICD-10-CM | POA: Diagnosis not present

## 2020-08-11 DIAGNOSIS — E785 Hyperlipidemia, unspecified: Secondary | ICD-10-CM | POA: Diagnosis not present

## 2020-08-11 DIAGNOSIS — E119 Type 2 diabetes mellitus without complications: Secondary | ICD-10-CM | POA: Diagnosis not present

## 2020-10-11 DIAGNOSIS — E119 Type 2 diabetes mellitus without complications: Secondary | ICD-10-CM | POA: Diagnosis not present

## 2020-10-11 DIAGNOSIS — I10 Essential (primary) hypertension: Secondary | ICD-10-CM | POA: Diagnosis not present

## 2020-11-11 DIAGNOSIS — I10 Essential (primary) hypertension: Secondary | ICD-10-CM | POA: Diagnosis not present

## 2020-11-11 DIAGNOSIS — E119 Type 2 diabetes mellitus without complications: Secondary | ICD-10-CM | POA: Diagnosis not present

## 2020-11-20 DIAGNOSIS — E782 Mixed hyperlipidemia: Secondary | ICD-10-CM | POA: Diagnosis not present

## 2020-11-20 DIAGNOSIS — E1129 Type 2 diabetes mellitus with other diabetic kidney complication: Secondary | ICD-10-CM | POA: Diagnosis not present

## 2020-11-27 DIAGNOSIS — E782 Mixed hyperlipidemia: Secondary | ICD-10-CM | POA: Diagnosis not present

## 2020-11-27 DIAGNOSIS — I152 Hypertension secondary to endocrine disorders: Secondary | ICD-10-CM | POA: Diagnosis not present

## 2020-11-27 DIAGNOSIS — E1129 Type 2 diabetes mellitus with other diabetic kidney complication: Secondary | ICD-10-CM | POA: Diagnosis not present

## 2020-11-27 DIAGNOSIS — E1159 Type 2 diabetes mellitus with other circulatory complications: Secondary | ICD-10-CM | POA: Diagnosis not present

## 2020-12-02 DIAGNOSIS — M25511 Pain in right shoulder: Secondary | ICD-10-CM | POA: Diagnosis not present

## 2020-12-11 DIAGNOSIS — I10 Essential (primary) hypertension: Secondary | ICD-10-CM | POA: Diagnosis not present

## 2020-12-11 DIAGNOSIS — E119 Type 2 diabetes mellitus without complications: Secondary | ICD-10-CM | POA: Diagnosis not present

## 2020-12-16 DIAGNOSIS — Z23 Encounter for immunization: Secondary | ICD-10-CM | POA: Diagnosis not present

## 2020-12-16 DIAGNOSIS — Z6826 Body mass index (BMI) 26.0-26.9, adult: Secondary | ICD-10-CM | POA: Diagnosis not present

## 2020-12-16 DIAGNOSIS — M25511 Pain in right shoulder: Secondary | ICD-10-CM | POA: Diagnosis not present

## 2020-12-19 ENCOUNTER — Other Ambulatory Visit: Payer: Self-pay | Admitting: Cardiology

## 2020-12-29 DIAGNOSIS — M25611 Stiffness of right shoulder, not elsewhere classified: Secondary | ICD-10-CM | POA: Diagnosis not present

## 2020-12-29 DIAGNOSIS — R293 Abnormal posture: Secondary | ICD-10-CM | POA: Diagnosis not present

## 2020-12-29 DIAGNOSIS — M6281 Muscle weakness (generalized): Secondary | ICD-10-CM | POA: Diagnosis not present

## 2020-12-29 DIAGNOSIS — M25511 Pain in right shoulder: Secondary | ICD-10-CM | POA: Diagnosis not present

## 2020-12-31 DIAGNOSIS — M25511 Pain in right shoulder: Secondary | ICD-10-CM | POA: Diagnosis not present

## 2020-12-31 DIAGNOSIS — R293 Abnormal posture: Secondary | ICD-10-CM | POA: Diagnosis not present

## 2020-12-31 DIAGNOSIS — M25611 Stiffness of right shoulder, not elsewhere classified: Secondary | ICD-10-CM | POA: Diagnosis not present

## 2020-12-31 DIAGNOSIS — M6281 Muscle weakness (generalized): Secondary | ICD-10-CM | POA: Diagnosis not present

## 2021-01-07 DIAGNOSIS — M6281 Muscle weakness (generalized): Secondary | ICD-10-CM | POA: Diagnosis not present

## 2021-01-07 DIAGNOSIS — R293 Abnormal posture: Secondary | ICD-10-CM | POA: Diagnosis not present

## 2021-01-07 DIAGNOSIS — M25511 Pain in right shoulder: Secondary | ICD-10-CM | POA: Diagnosis not present

## 2021-01-07 DIAGNOSIS — M25611 Stiffness of right shoulder, not elsewhere classified: Secondary | ICD-10-CM | POA: Diagnosis not present

## 2021-01-13 DIAGNOSIS — M25611 Stiffness of right shoulder, not elsewhere classified: Secondary | ICD-10-CM | POA: Diagnosis not present

## 2021-01-13 DIAGNOSIS — R293 Abnormal posture: Secondary | ICD-10-CM | POA: Diagnosis not present

## 2021-01-13 DIAGNOSIS — M25511 Pain in right shoulder: Secondary | ICD-10-CM | POA: Diagnosis not present

## 2021-01-13 DIAGNOSIS — M6281 Muscle weakness (generalized): Secondary | ICD-10-CM | POA: Diagnosis not present

## 2021-01-14 DIAGNOSIS — M25511 Pain in right shoulder: Secondary | ICD-10-CM | POA: Diagnosis not present

## 2021-01-14 DIAGNOSIS — Z6826 Body mass index (BMI) 26.0-26.9, adult: Secondary | ICD-10-CM | POA: Diagnosis not present

## 2021-01-14 DIAGNOSIS — E1129 Type 2 diabetes mellitus with other diabetic kidney complication: Secondary | ICD-10-CM | POA: Diagnosis not present

## 2021-01-26 ENCOUNTER — Other Ambulatory Visit: Payer: Self-pay | Admitting: Cardiology

## 2021-01-27 DIAGNOSIS — Z6827 Body mass index (BMI) 27.0-27.9, adult: Secondary | ICD-10-CM | POA: Diagnosis not present

## 2021-01-27 DIAGNOSIS — R197 Diarrhea, unspecified: Secondary | ICD-10-CM | POA: Diagnosis not present

## 2021-01-28 DIAGNOSIS — G8929 Other chronic pain: Secondary | ICD-10-CM | POA: Diagnosis not present

## 2021-01-28 DIAGNOSIS — R197 Diarrhea, unspecified: Secondary | ICD-10-CM | POA: Diagnosis not present

## 2021-01-28 DIAGNOSIS — M25511 Pain in right shoulder: Secondary | ICD-10-CM | POA: Diagnosis not present

## 2021-01-30 ENCOUNTER — Telehealth: Payer: Self-pay | Admitting: Cardiology

## 2021-01-30 NOTE — Telephone Encounter (Signed)
°*  STAT* If patient is at the pharmacy, call can be transferred to refill team.   1. Which medications need to be refilled? (please list name of each medication and dose if known) metoprolol succinate (TOPROL-XL) 50 MG 24 hr tablet   2. Which pharmacy/location (including street and city if local pharmacy) is medication to be sent to?[facility] - [place], [place] - [address]    3. Do they need a 30 day or 90 day supply? 90 ds  Patient is going out of the country and is needing a 90 ds of her medication... please advise.

## 2021-02-03 NOTE — Telephone Encounter (Signed)
Spoke with Gerilyn Nestle, patient son, advised we are unable to fill request because, the patient has not been since 2021. Gerilyn Nestle is aware we typically need to evaluate the patient at least once yearly to continue fill. I offered to make an appt and he decline stating the patient will be out of the country for 3 months.

## 2021-02-11 DIAGNOSIS — I10 Essential (primary) hypertension: Secondary | ICD-10-CM | POA: Diagnosis not present

## 2021-02-11 DIAGNOSIS — E119 Type 2 diabetes mellitus without complications: Secondary | ICD-10-CM | POA: Diagnosis not present

## 2021-02-24 ENCOUNTER — Other Ambulatory Visit: Payer: Self-pay | Admitting: Cardiology

## 2021-06-19 ENCOUNTER — Other Ambulatory Visit: Payer: Self-pay | Admitting: Cardiology

## 2021-07-09 DIAGNOSIS — E860 Dehydration: Secondary | ICD-10-CM | POA: Diagnosis not present

## 2021-07-09 DIAGNOSIS — Z6826 Body mass index (BMI) 26.0-26.9, adult: Secondary | ICD-10-CM | POA: Diagnosis not present

## 2021-07-09 DIAGNOSIS — J020 Streptococcal pharyngitis: Secondary | ICD-10-CM | POA: Diagnosis not present

## 2021-07-09 DIAGNOSIS — Z20822 Contact with and (suspected) exposure to covid-19: Secondary | ICD-10-CM | POA: Diagnosis not present

## 2021-07-09 DIAGNOSIS — N179 Acute kidney failure, unspecified: Secondary | ICD-10-CM | POA: Diagnosis not present

## 2021-07-09 DIAGNOSIS — I2510 Atherosclerotic heart disease of native coronary artery without angina pectoris: Secondary | ICD-10-CM | POA: Diagnosis not present

## 2021-07-09 DIAGNOSIS — D649 Anemia, unspecified: Secondary | ICD-10-CM | POA: Diagnosis not present

## 2021-07-09 DIAGNOSIS — R059 Cough, unspecified: Secondary | ICD-10-CM | POA: Diagnosis not present

## 2021-07-09 DIAGNOSIS — U071 COVID-19: Secondary | ICD-10-CM | POA: Diagnosis not present

## 2021-07-11 ENCOUNTER — Other Ambulatory Visit: Payer: Self-pay | Admitting: Cardiology

## 2021-07-16 ENCOUNTER — Other Ambulatory Visit: Payer: Self-pay

## 2021-07-16 DIAGNOSIS — Z6826 Body mass index (BMI) 26.0-26.9, adult: Secondary | ICD-10-CM | POA: Diagnosis not present

## 2021-07-16 DIAGNOSIS — L97919 Non-pressure chronic ulcer of unspecified part of right lower leg with unspecified severity: Secondary | ICD-10-CM | POA: Diagnosis not present

## 2021-07-16 DIAGNOSIS — R0989 Other specified symptoms and signs involving the circulatory and respiratory systems: Secondary | ICD-10-CM | POA: Diagnosis not present

## 2021-07-16 DIAGNOSIS — E1129 Type 2 diabetes mellitus with other diabetic kidney complication: Secondary | ICD-10-CM | POA: Diagnosis not present

## 2021-07-17 ENCOUNTER — Ambulatory Visit (INDEPENDENT_AMBULATORY_CARE_PROVIDER_SITE_OTHER): Payer: Medicare HMO | Admitting: Cardiology

## 2021-07-17 ENCOUNTER — Encounter: Payer: Self-pay | Admitting: Cardiology

## 2021-07-17 VITALS — BP 130/72 | HR 61 | Ht 66.0 in | Wt 156.0 lb

## 2021-07-17 DIAGNOSIS — I7121 Aneurysm of the ascending aorta, without rupture: Secondary | ICD-10-CM

## 2021-07-17 DIAGNOSIS — E782 Mixed hyperlipidemia: Secondary | ICD-10-CM

## 2021-07-17 DIAGNOSIS — I25709 Atherosclerosis of coronary artery bypass graft(s), unspecified, with unspecified angina pectoris: Secondary | ICD-10-CM | POA: Diagnosis not present

## 2021-07-17 DIAGNOSIS — I10 Essential (primary) hypertension: Secondary | ICD-10-CM | POA: Diagnosis not present

## 2021-07-17 MED ORDER — METOPROLOL SUCCINATE ER 50 MG PO TB24: 50.0000 mg | ORAL_TABLET | Freq: Every day | ORAL | 3 refills | Status: DC

## 2021-07-17 MED ORDER — AMLODIPINE BESYLATE 10 MG PO TABS: 10.0000 mg | ORAL_TABLET | Freq: Every day | ORAL | 3 refills | Status: DC

## 2021-07-17 NOTE — Progress Notes (Signed)
At the time of my evaluation, the patient is alert awake oriented and in no distress. Cardiology Office Note:    Date:  07/17/2021   ID:  Mary [name], DOB 1940/10/22, MRN [id number]  PCP:  Charlott Rakes, MD  Cardiologist:  Garwin Brothers, MD   Referring MD: Charlott Rakes, MD    ASSESSMENT:    1. Coronary artery disease involving coronary bypass graft of native heart with angina pectoris (HCC)   2. Essential (primary) hypertension   3. Aneurysm of ascending aorta without rupture (HCC)   4. Mixed hyperlipidemia    PLAN:    In order of problems listed above:  Coronary artery disease:: Secondary prevention stressed with the patient.  Importance of compliance with diet medication stressed and she vocalized understanding.  She was advised to walk at least half an hour a day 5 days a week and she promises to do so. Essential hypertension: Blood pressure stable and diet was emphasized.  Lifestyle modification urged. Ascending aortic aneurysm: She will have a CT scan to assess this on an annual basis. Anemia: She will be back in the next few days for complete blood work. Echocardiogram will be done to assess murmur heard on auscultation. Patient will be seen in follow-up appointment in 6 months or earlier if the patient has any concerns    Medication Adjustments/Labs and Tests Ordered: Current medicines are reviewed at length with the patient today.  Concerns regarding medicines are outlined above.  Orders Placed This Encounter  Procedures   CT CHEST WO CONTRAST   Basic metabolic panel   CBC   Hepatic function panel   Lipid panel   TSH   EKG 12-Lead   ECHOCARDIOGRAM COMPLETE   Meds ordered this encounter  Medications   amLODipine (NORVASC) 10 MG tablet    Sig: Take 1 tablet (10 mg total) by mouth daily in the afternoon.    Dispense:  90 tablet    Refill:  3   metoprolol succinate (TOPROL-XL) 50 MG 24 hr tablet    Sig: Take 1 tablet (50 mg total) by mouth daily.     Dispense:  90 tablet    Refill:  3     No chief complaint on file.    History of Present Illness:    Mary [name] is a 81 y.o. female.  Patient has past medical history of coronary artery disease, ascending aortic aneurysm, essential hypertension and mixed dyslipidemia.  She denies any problems at this time and takes care of activities of daily living.  She is an active lady.  No chest pain orthopnea or PND.  At the time of my evaluation, the patient is alert awake oriented and in no distress.  Past Medical History:  Diagnosis Date   Arthritis    Ascending aortic aneurysm (HCC) 11/22/2019   CAD in native artery 09/18/2014   Cellulitis 04/06/2014   Distal right lower extremity    Coronary artery disease    Coronary artery disease involving coronary bypass graft of native heart with angina pectoris (HCC) 07/31/2018   Coronavirus infection 10/10/2018   Formatting of this note might be different from the original. This patient presented to the [facility] on 10/07/2018 where she was tested for COVID-19. The patient's POS COVID-19 test resulted on 10/10/2018. The patient's granddaughter, on HIPAA, was contacted by Alfonzo Beers PA-C for notification on 10/10/2018 at 0935 hr.  Granddaughter reports patient lives at home w   Diabetes mellitus due to underlying condition  with unspecified complications (HCC) 04/06/2014   Diabetes mellitus without complication (HCC)    DM II   Dyslipidemia 09/18/2014   Essential (primary) hypertension 09/18/2014   Fever 04/06/2014   H/O coronary artery bypass surgery 09/24/2014   H/O transfusion    Hyperlipemia    Hyponatremia 04/06/2014   Hypoxia 04/06/2014   Primary osteoarthritis of knee 06/02/2015   Primary osteoarthritis of right knee 03/29/2014    Past Surgical History:  Procedure Laterality Date   BACK SURGERY  1992   CARDIAC CATHETERIZATION     CARPAL TUNNEL RELEASE Left    COLONOSCOPY     CORONARY ARTERY BYPASS GRAFT  11/10/1994    x 3   EYE SURGERY Bilateral    Glaucoma   TOTAL KNEE ARTHROPLASTY Right 04/01/2014   Procedure: RIGHT TOTAL KNEE ARTHROPLASTY;  Surgeon: Gean Birchwood, MD;  Location: [facility] OR;  Service: Orthopedics;  Laterality: Right;   TOTAL KNEE ARTHROPLASTY Left 06/02/2015   Procedure: LEFT TOTAL KNEE ARTHROPLASTY;  Surgeon: Gean Birchwood, MD;  Location: [facility] OR;  Service: Orthopedics;  Laterality: Left;   TUBAL LIGATION      Current Medications: Current Meds  Medication Sig   aspirin EC 81 MG tablet Take 81 mg by mouth daily.   cloNIDine (CATAPRES) 0.2 MG tablet Take 1 tablet by mouth twice daily   clopidogrel (PLAVIX) 75 MG tablet Take 75 mg by mouth daily.   esomeprazole (NEXIUM) 40 MG capsule Take 40 mg by mouth daily.   fenofibrate (TRICOR) 145 MG tablet Take 1 tablet (145 mg total) by mouth daily.   isosorbide mononitrate (IMDUR) 60 MG 24 hr tablet Take 1 tablet (60 mg total) by mouth daily.   nitroGLYCERIN (NITROSTAT) 0.4 MG SL tablet Place 0.4 mg under the tongue every 5 (five) minutes as needed for chest pain.   ranolazine (RANEXA) 1000 MG SR tablet Take 1 tablet (1,000 mg total) by mouth 2 (two) times daily.   rosuvastatin (CRESTOR) 10 MG tablet Take 10 mg by mouth daily.   SYNJARDY XR 25-1000 MG TB24 Take 1 tablet by mouth daily.   valsartan (DIOVAN) 320 MG tablet Take 320 mg by mouth daily.   [DISCONTINUED] amLODipine (NORVASC) 10 MG tablet Take 10 mg by mouth daily in the afternoon.   [DISCONTINUED] metoprolol succinate (TOPROL-XL) 50 MG 24 hr tablet Take 1 tablet (50 mg total) by mouth daily. Patient needs appointment for further refills. 3 rd/final attempt     Allergies:   Patient has no known allergies.   Social History   Socioeconomic History   Marital status: Single    Spouse name: Not on file   Number of children: Not on file   Years of education: Not on file   Highest education level: Not on file  Occupational History   Not on file  Tobacco Use   Smoking status: Former     Packs/day: 0.50    Years: 5.00    Total pack years: 2.50    Types: Cigarettes    Quit date: 04/05/1985    Years since quitting: 36.3   Smokeless tobacco: Never  Vaping Use   Vaping Use: Never used  Substance and Sexual Activity   Alcohol use: No   Drug use: No   Sexual activity: Never  Other Topics Concern   Not on file  Social History Narrative   Not on file   Social Determinants of Health   Financial Resource Strain: Not on file  Food Insecurity: Not on file  Transportation  Needs: Not on file  Physical Activity: Not on file  Stress: Not on file  Social Connections: Not on file     Family History: The patient's family history includes Diabetes in her father; Heart Problems in her mother; High Cholesterol in her mother; Hypertension in her brother; Stroke in her mother.  ROS:   Please see the history of present illness.    All other systems reviewed and are negative.  EKGs/Labs/Other Studies Reviewed:    The following studies were reviewed today: I discussed my findings with the patient at length.  EKG reveals sinus and nonspecific ST changes   Recent Labs: No results found for requested labs within last 365 days.  Recent Lipid Panel No results found for: "CHOL", "TRIG", "HDL", "CHOLHDL", "VLDL", "LDLCALC", "LDLDIRECT"  Physical Exam:    VS:  BP 130/72   Pulse 61   Ht 5\' 6"  (1.676 m)   Wt 156 lb (70.8 kg)   SpO2 97%   BMI 25.18 kg/m     Wt Readings from Last 3 Encounters:  07/17/21 156 lb (70.8 kg)  11/22/19 163 lb 12.8 oz (74.3 kg)  10/17/19 163 lb 4.8 oz (74.1 kg)     GEN: Patient is in no acute distress HEENT: Normal NECK: No JVD; No carotid bruits LYMPHATICS: No lymphadenopathy CARDIAC: Hear sounds regular, 2/6 systolic murmur at the apex. RESPIRATORY:  Clear to auscultation without rales, wheezing or rhonchi  ABDOMEN: Soft, non-tender, non-distended MUSCULOSKELETAL:  No edema; No deformity  SKIN: Warm and dry NEUROLOGIC:  Alert and oriented x  3 PSYCHIATRIC:  Normal affect   Signed, 12/17/19, MD  07/17/2021 11:34 AM    [facility]

## 2021-07-17 NOTE — Patient Instructions (Signed)
Instrucciones de medicamentos: Su mdico recomienda que contine con sus medicamentos actuales segn las indicaciones. Consulte la lista de medicamentos actuales que se le entreg hoy. *Si necesita un resurtido de sus medicamentos cardacos antes de su prxima cita, llame a su farmacia*   Trabajo de laboratorio:  Si se le realizaron [employer] de laboratorio (anlisis de [place]) hoy y sus pruebas son completamente normales, recibir sus resultados solo cuando:  Mensaje de MyChart (si tiene MyChart) Cheron Schaumann copia en papel por correo Si tiene alguna prueba de laboratorio que es anormal o necesitamos cambiar su tratamiento, lo llamaremos para revisar los [place].   Pruebas/Procedimientos:  Su mdico ha solicitado que se Engineer, manufacturing. La ecocardiografa es una prueba indolora que [place] ondas de sonido para crear imgenes de su corazn. Le brinda a su mdico informacin sobre el tamao y la forma de su corazn y qu tan bien estn funcionando las cmaras y vlvulas de su corazn. Este procedimiento dura aproximadamente una hora. No hay restricciones para este procedimiento.  La tomografa computarizada no cardaca (escaneo CAT) es una radiografa especial no invasiva que produce imgenes transversales del cuerpo usando rayos X y [place] computadora. Las tomografas computarizadas ayudan a los mdicos a Education administrator y Radio broadcast assistant. Para algunos exmenes de TC, se [place] un material de contraste para mejorar la visibilidad en el rea del cuerpo que se est estudiando. Las tomografas computarizadas brindan mayor claridad y revelan ms detalles que los exmenes regulares de rayos X. Esto se har en [facility].  Su mdico recomienda que regrese para el anlisis de laboratorio en: los [employer]. Debe realizarse anlisis de laboratorio cuando est en ayunas. Puede venir de lunes a viernes de 8:30 a. m. a 12:00 p. m. y de 1:15 a. m. a 4:30 p. m. No es necesario concertar cita ya que  el pedido ya se ha realizado. Los laboratorios que le Zenaida Niece a hacer son BMET, CBC, TSH, LFT y Lipids.   Hacer un seguimiento: En [facility], usted y sus necesidades de salud son Fer[place] Coe prioridad. Como parte de nuestra misin continua de brindarle una atencin cardaca excepcional, hemos creado equipos de atencin de proveedores designados. Estos equipos de atencin incluyen a su cardilogo primario (mdico) y proveedores de Cabin crew (APP, asistentes mdicos y enfermeras practicantes) que trabajan juntos para brindarle la atencin que necesita, cuando la necesita.  Recomendamos registrarse en el portal del paciente llamado "MyChart". La informacin de registro se proporciona en este Resumen posterior a la visita. MyChart se [place] para conectarse con pacientes para visitas virtuales (telemedicina). Los [employer] de laboratorio/pruebas, notas de encuentros, prximas citas, etc. Tambin se pueden enviar mensajes no urgentes a su proveedor. Para obtener ms informacin sobre lo que [employer] con MyChart, vaya a [email or web address].  Su prxima cita: 9 meses  El formato para su prxima cita: En persona  Proveedor: Dr. Glean Hess Revankar   Otras instrucciones Ecocardiograma Echocardiogram Un ecocardiograma es una prueba que [place] ondas sonoras (ecografa) para obtener imgenes del corazn. Las imgenes de un ecocardiograma pueden proporcionar informacin importante sobre lo siguiente: Tamao y forma del corazn. [place], el grosor y el movimiento de las paredes del Programmer, multimedia. La funcin y la fuerza del msculo cardaco. La funcin de la vlvula cardaca o si tiene estenosis. Se presenta estenosis cuando las vlvulas cardacas son demasiado estrechas. Si la sangre [employer] atrs a travs de las vlvulas cardacas (regurgitacin). Un tumor o crecimiento infeccioso alrededor de las vlvulas cardacas. reas  del msculo cardaco que no funcionan bien debido a un  flujo sanguneo deficiente o a una lesin a causa de un infarto de miocardio. Signos de aneurisma. Un aneurisma es una parte debilitada o daada en la pared de una arteria. La pared se abulta debido a la fuerza normal que produce el bombeo de la sangre a travs del cuerpo. Informe al mdico acerca de lo siguiente: Cualquier alergia que tenga. Todos los [employer] [place], incluidos vitaminas, hierbas, gotas oftlmicas, cremas y [address] de [address]. Cualquier trastorno de la sangre que tenga. Cirugas a las que se haya sometido. Cualquier afeccin mdica que tenga. Si est embarazada o podra estarlo. Cules son los riesgos? Por lo general, se trata de una prueba segura. Sin embargo, pueden presentarse problemas, como una reaccin alrgica al tinte Investment banker, operational) que se puede usar durante la prueba. Qu ocurre antes de esta prueba? No se requiere Lobbyist. Podr comer y beber normalmente. Qu ocurre durante la prueba?  Se quitar la ropa de la cintura para [place] y se pondr una bata de hospital. Es posible que le coloquen electrodos oparches de electrocardiograma (ECG) en el pecho. Luego los electrodos o parches se conectan a un dispositivo que controla su ritmo y frecuencia cardaca. Se acostar sobre una mesa para que le realicen una ecografa. Se le aplicar un gel en el pecho para ayudar a que las ondas sonoras pasen a travs de la piel. Se presionar un dispositivo de mano, llamado transductor, contra el pecho y se mover sobre su corazn. El transductor produce ondas sonoras que viajan hasta el corazn y rebotan (o producen "eco") Air cabin crew. Estas ondas sonoras se captarn en tiempo real y se transformarn en imgenes del corazn que se pueden ver en un monitor de vdeo. Las imgenes se grabarn en una computadora y sern revisadas por el mdico. Podrn solicitarle que cambie de posicin o que contenga la respiracin durante un lapso breve. Esto hace  que sea ms fcil obtener diferentes vistas o mejores vistas del corazn. En algunos casos, puede recibir [place] a travs de una va intravenosa (i.v). en una de las venas. Esto puede mejorar la calidad de las imgenes del corazn. El procedimiento puede variar segn el mdico y el hospital. Qu puedo esperar despus de la prueba? Puede retomar su rutina diaria normal, incluidos la dieta, las actividades y [employer], a menos que su mdico le indique lo contrario. Siga estas instrucciones en su casa: Recoger los [employer] de la prueba es su responsabilidad. Consulte al mdico o pregunte en el departamento donde se realiza la prueba cundo estarn [employer]. Cumpla con todas las visitas de seguimiento. Esto es importante. Resumen Un ecocardiograma es una prueba que [place] ondas sonoras (ecografa) para obtener imgenes del corazn. Las [employer] de un ecocardiograma pueden brindar informacin importante sobre el tamao y la forma del corazn, la funcin del msculo cardaco, la funcin de la vlvula cardaca y otros posibles problemas cardacos. No es necesario prepararse para esta prueba. Podr comer y beber normalmente. Al finalizar el ecocardiograma, puede retomar su rutina diaria normal, a menos que su mdico le indique lo contrario. Esta informacin no tiene Theme park manager el consejo del mdico. Asegrese de hacerle al mdico cualquier pregunta que tenga. Document Revised: 10/25/2019 Document Reviewed: 10/25/2019 Elsevier Patient Education  2023 Elsevier Inc.   CT Scan  A CT scan (computed tomography scan) is an imaging scan. It uses X-rays and a computer to make detailed pictures of different  areas inside the body. A CT scan can give more information than a regular X-ray exam. A CT scan provides data about internal organs, soft tissue structures, blood vessels, and bones. In this procedure, the pictures will be taken in a large machine that has an opening (CT  scanner). Tell a health care provider about: Any allergies you have. All medicines you are taking, including vitamins, herbs, eye drops, creams, and over-the-counter medicines. Any blood disorders you have. Any surgeries you have had. Any medical conditions you have. Whether you are pregnant or may be pregnant. What are the risks? Generally, this is a safe procedure. However, problems may occur, including: An allergic reaction to dye injected during the procedure. Development of cancer from excessive exposure to radiation from multiple CT scans. This is rare. What happens before the procedure? Staying hydrated Follow instructions from your health care provider about hydration, which may include: Up to 2 hours before the procedure - you may continue to drink clear liquids, such as water, clear fruit juice, black coffee, and plain tea. Eating and drinking restrictions Follow instructions from your health care provider about eating and drinking, which may include: 24 hours before the procedure - stop drinking caffeinated beverages, such as energy drinks, tea, soda, coffee, and hot chocolate. 8 hours before the procedure - stop eating heavy meals or foods such as meat, fried foods, or fatty foods. 6 hours before the procedure - stop eating light meals or foods, such as toast or cereal. 6 hours before the procedure - stop drinking milk or drinks that contain milk. 2 hours before the procedure - stop drinking clear liquids. General instructions Remove any jewelry. Ask your health care provider about changing or stopping your regular medicines. This is especially important if you are taking diabetes medicines or blood thinners. What happens during the procedure? An IV tube may be inserted into one of your veins. The contrast dye may be injected into the IV tube. You may feel warm or have a metallic taste in your mouth. You will lie on a table with your arms above your head. The table you will  be lying on will move into the CT scanner. You will be able to see, hear, and talk to the person running the machine while you are in it. Follow that person's instructions. The CT scanner will move around you to take pictures. Do not move while it is scanning. Staying still helps the scanner to get a good image. When the best possible pictures have been taken, the machine will be turned off. The table will be moved out of the machine. The IV tube will be removed. The procedure may vary among health care providers and hospitals. What happens after the procedure? It is up to you to get the results of your procedure. Ask your health care provider, or the department that is doing the procedure, when your results will be ready. Summary A CT scan is an imaging scan. A CT scan uses X-rays and a computer to make detailed pictures of different areas of your body. Follow instructions from your health care provider about eating and drinking before the procedure. You will be able to see, hear, and talk to the person running the machine while you are in it. Follow that person's instructions. This information is not intended to replace advice given to you by your health care provider. Make sure you discuss any questions you have with your health care provider. Document Revised: 09/10/2020 Document Reviewed: 11/29/2019 Elsevier  Patient Education  2023 [employer].     Medication Instructions:  Your physician recommends that you continue on your current medications as directed. Please refer to the Current Medication list given to you today.  *If you need a refill on your cardiac medications before your next appointment, please call your pharmacy*   Lab Work: Your physician recommends that you return for lab work in: the next few days. You need to have labs done when you are fasting.  You can come Monday through Friday 8:30 am to 12:00 pm and 1:15 to 4:30. You do not need to make an appointment as the  order has already been placed. The labs you are going to have done are BMET, CBC, TSH, LFT and Lipids.  If you have labs (blood work) drawn today and your tests are completely normal, you will receive your results only by: MyChart Message (if you have MyChart) OR A paper copy in the mail If you have any lab test that is abnormal or we need to change your treatment, we will call you to review the results.   Testing/Procedures: Your physician has requested that you have an echocardiogram. Echocardiography is a painless test that uses sound waves to create images of your heart. It provides your doctor with information about the size and shape of your heart and how well your heart's chambers and valves are working. This procedure takes approximately one hour. There are no restrictions for this procedure.   Non-Cardiac CT scanning, (CAT scanning), is a noninvasive, special x-ray that produces cross-sectional images of the body using x-rays and a computer. CT scans help physicians diagnose and treat medical conditions. For some CT exams, a contrast material is used to enhance visibility in the area of the body being studied. CT scans provide greater clarity and reveal more details than regular x-ray exams.  Follow-Up: At [facility], you and your health needs are our priority.  As part of our continuing mission to provide you with exceptional heart care, we have created designated Provider Care Teams.  These Care Teams include your primary Cardiologist (physician) and Advanced Practice Providers (APPs -  Physician Assistants and Nurse Practitioners) who all work together to provide you with the care you need, when you need it.  We recommend signing up for the patient portal called "MyChart".  Sign up information is provided on this After Visit Summary.  MyChart is used to connect with patients for Virtual Visits (Telemedicine).  Patients are able to view lab/test results, encounter notes, upcoming  appointments, etc.  Non-urgent messages can be sent to your provider as well.   To learn more about what you can do with MyChart, go to [email or web address].    Your next appointment:   9 month(s)  The format for your next appointment:   In Person  Provider:   Belva Crome, MD   Other Instructions NA

## 2021-07-22 DIAGNOSIS — I700 Atherosclerosis of aorta: Secondary | ICD-10-CM | POA: Diagnosis not present

## 2021-07-22 DIAGNOSIS — I25709 Atherosclerosis of coronary artery bypass graft(s), unspecified, with unspecified angina pectoris: Secondary | ICD-10-CM | POA: Diagnosis not present

## 2021-07-22 DIAGNOSIS — I7121 Aneurysm of the ascending aorta, without rupture: Secondary | ICD-10-CM | POA: Diagnosis not present

## 2021-07-22 DIAGNOSIS — E782 Mixed hyperlipidemia: Secondary | ICD-10-CM | POA: Diagnosis not present

## 2021-07-22 DIAGNOSIS — I2510 Atherosclerotic heart disease of native coronary artery without angina pectoris: Secondary | ICD-10-CM | POA: Diagnosis not present

## 2021-07-22 DIAGNOSIS — I10 Essential (primary) hypertension: Secondary | ICD-10-CM | POA: Diagnosis not present

## 2021-07-22 DIAGNOSIS — I728 Aneurysm of other specified arteries: Secondary | ICD-10-CM | POA: Diagnosis not present

## 2021-07-22 DIAGNOSIS — N2889 Other specified disorders of kidney and ureter: Secondary | ICD-10-CM | POA: Diagnosis not present

## 2021-07-22 DIAGNOSIS — I712 Thoracic aortic aneurysm, without rupture, unspecified: Secondary | ICD-10-CM | POA: Diagnosis not present

## 2021-07-23 DIAGNOSIS — L97929 Non-pressure chronic ulcer of unspecified part of left lower leg with unspecified severity: Secondary | ICD-10-CM | POA: Diagnosis not present

## 2021-07-23 DIAGNOSIS — E1129 Type 2 diabetes mellitus with other diabetic kidney complication: Secondary | ICD-10-CM | POA: Diagnosis not present

## 2021-07-23 DIAGNOSIS — Z6826 Body mass index (BMI) 26.0-26.9, adult: Secondary | ICD-10-CM | POA: Diagnosis not present

## 2021-07-23 DIAGNOSIS — N390 Urinary tract infection, site not specified: Secondary | ICD-10-CM | POA: Diagnosis not present

## 2021-07-23 LAB — BASIC METABOLIC PANEL
BUN/Creatinine Ratio: 16 (ref 12–28)
BUN: 18 mg/dL (ref 8–27)
CO2: 25 mmol/L (ref 20–29)
Calcium: 9.9 mg/dL (ref 8.7–10.3)
Chloride: 98 mmol/L (ref 96–106)
Creatinine, Ser: 1.13 mg/dL — ABNORMAL HIGH (ref 0.57–1.00)
Glucose: 169 mg/dL — ABNORMAL HIGH (ref 70–99)
Potassium: 5 mmol/L (ref 3.5–5.2)
Sodium: 136 mmol/L (ref 134–144)
eGFR: 49 mL/min/{1.73_m2} — ABNORMAL LOW (ref >59)

## 2021-07-23 LAB — CBC
Hematocrit: 36.8 % (ref 34.0–46.6)
Hemoglobin: 12.3 g/dL (ref 11.1–15.9)
MCH: 31.3 pg (ref 26.6–33.0)
MCHC: 33.4 g/dL (ref 31.5–35.7)
MCV: 94 fL (ref 79–97)
Platelets: 227 10*3/uL (ref 150–450)
RBC: 3.93 x10E6/uL (ref 3.77–5.28)
RDW: 13 % (ref 11.7–15.4)
WBC: 9.9 10*3/uL (ref 3.4–10.8)

## 2021-07-23 LAB — LIPID PANEL
Chol/HDL Ratio: 3.2 ratio (ref 0.0–4.4)
Cholesterol, Total: 176 mg/dL (ref 100–199)
HDL: 55 mg/dL (ref >39)
LDL Chol Calc (NIH): 95 mg/dL (ref 0–99)
Triglycerides: 147 mg/dL (ref 0–149)
VLDL Cholesterol Cal: 26 mg/dL (ref 5–40)

## 2021-07-23 LAB — HEPATIC FUNCTION PANEL
ALT: 9 IU/L (ref 0–32)
AST: 12 IU/L (ref 0–40)
Albumin: 4.3 g/dL (ref 3.8–4.8)
Alkaline Phosphatase: 67 IU/L (ref 44–121)
Bilirubin Total: 0.5 mg/dL (ref 0.0–1.2)
Bilirubin, Direct: 0.17 mg/dL (ref 0.00–0.40)
Total Protein: 6.8 g/dL (ref 6.0–8.5)

## 2021-07-23 LAB — TSH: TSH: 1.94 u[IU]/mL (ref 0.450–4.500)

## 2021-07-24 ENCOUNTER — Telehealth: Payer: Self-pay | Admitting: Cardiology

## 2021-07-24 NOTE — Telephone Encounter (Signed)
[facility] called with echo results on pt. Call transferred to [facility], triage.

## 2021-07-24 NOTE — Telephone Encounter (Signed)
Spoke with Mary [name] per DPR and advised of stable aneurysm 1 yr FU. Advised that pt has a renal lesion and needs to FU with Dr. Yetta Flock regarding same.  Spoke with [name] at Dr. Yetta Flock office and she will make Dr. Yetta Flock aware. Report faxed.

## 2021-07-29 ENCOUNTER — Other Ambulatory Visit: Payer: Medicare HMO

## 2021-07-30 DIAGNOSIS — Z136 Encounter for screening for cardiovascular disorders: Secondary | ICD-10-CM | POA: Diagnosis not present

## 2021-07-30 DIAGNOSIS — Z139 Encounter for screening, unspecified: Secondary | ICD-10-CM | POA: Diagnosis not present

## 2021-07-30 DIAGNOSIS — Z0000 Encounter for general adult medical examination without abnormal findings: Secondary | ICD-10-CM | POA: Diagnosis not present

## 2021-07-30 DIAGNOSIS — Z1339 Encounter for screening examination for other mental health and behavioral disorders: Secondary | ICD-10-CM | POA: Diagnosis not present

## 2021-07-30 DIAGNOSIS — L97929 Non-pressure chronic ulcer of unspecified part of left lower leg with unspecified severity: Secondary | ICD-10-CM | POA: Diagnosis not present

## 2021-07-30 DIAGNOSIS — Z6826 Body mass index (BMI) 26.0-26.9, adult: Secondary | ICD-10-CM | POA: Diagnosis not present

## 2021-07-30 DIAGNOSIS — Z96652 Presence of left artificial knee joint: Secondary | ICD-10-CM | POA: Diagnosis not present

## 2021-07-30 DIAGNOSIS — Z1331 Encounter for screening for depression: Secondary | ICD-10-CM | POA: Diagnosis not present

## 2021-07-30 DIAGNOSIS — N1831 Chronic kidney disease, stage 3a: Secondary | ICD-10-CM | POA: Diagnosis not present

## 2021-07-30 DIAGNOSIS — I959 Hypotension, unspecified: Secondary | ICD-10-CM | POA: Diagnosis not present

## 2021-07-30 DIAGNOSIS — N2889 Other specified disorders of kidney and ureter: Secondary | ICD-10-CM | POA: Diagnosis not present

## 2021-07-30 DIAGNOSIS — Z471 Aftercare following joint replacement surgery: Secondary | ICD-10-CM | POA: Diagnosis not present

## 2021-08-10 ENCOUNTER — Ambulatory Visit (INDEPENDENT_AMBULATORY_CARE_PROVIDER_SITE_OTHER): Payer: Medicare HMO

## 2021-08-10 DIAGNOSIS — I7121 Aneurysm of the ascending aorta, without rupture: Secondary | ICD-10-CM | POA: Diagnosis not present

## 2021-08-10 DIAGNOSIS — I25709 Atherosclerosis of coronary artery bypass graft(s), unspecified, with unspecified angina pectoris: Secondary | ICD-10-CM | POA: Diagnosis not present

## 2021-08-10 DIAGNOSIS — I10 Essential (primary) hypertension: Secondary | ICD-10-CM

## 2021-08-10 LAB — ECHOCARDIOGRAM COMPLETE
Area-P 1/2: 3.27 cm2
P 1/2 time: 466 msec
S' Lateral: 4 cm

## 2021-08-12 DIAGNOSIS — K5730 Diverticulosis of large intestine without perforation or abscess without bleeding: Secondary | ICD-10-CM | POA: Diagnosis not present

## 2021-08-12 DIAGNOSIS — N281 Cyst of kidney, acquired: Secondary | ICD-10-CM | POA: Diagnosis not present

## 2021-08-12 DIAGNOSIS — I700 Atherosclerosis of aorta: Secondary | ICD-10-CM | POA: Diagnosis not present

## 2021-08-12 DIAGNOSIS — N2889 Other specified disorders of kidney and ureter: Secondary | ICD-10-CM | POA: Diagnosis not present

## 2021-08-12 DIAGNOSIS — R600 Localized edema: Secondary | ICD-10-CM | POA: Diagnosis not present

## 2021-08-13 DIAGNOSIS — M810 Age-related osteoporosis without current pathological fracture: Secondary | ICD-10-CM | POA: Diagnosis not present

## 2021-08-13 DIAGNOSIS — E2839 Other primary ovarian failure: Secondary | ICD-10-CM | POA: Diagnosis not present

## 2021-08-18 DIAGNOSIS — L97822 Non-pressure chronic ulcer of other part of left lower leg with fat layer exposed: Secondary | ICD-10-CM | POA: Diagnosis not present

## 2021-08-18 DIAGNOSIS — E11622 Type 2 diabetes mellitus with other skin ulcer: Secondary | ICD-10-CM | POA: Diagnosis not present

## 2021-08-18 DIAGNOSIS — I872 Venous insufficiency (chronic) (peripheral): Secondary | ICD-10-CM | POA: Diagnosis not present

## 2021-08-18 DIAGNOSIS — E1169 Type 2 diabetes mellitus with other specified complication: Secondary | ICD-10-CM | POA: Diagnosis not present

## 2021-08-18 DIAGNOSIS — L97922 Non-pressure chronic ulcer of unspecified part of left lower leg with fat layer exposed: Secondary | ICD-10-CM | POA: Diagnosis not present

## 2021-08-19 DIAGNOSIS — L97812 Non-pressure chronic ulcer of other part of right lower leg with fat layer exposed: Secondary | ICD-10-CM | POA: Diagnosis not present

## 2021-08-22 DIAGNOSIS — N201 Calculus of ureter: Secondary | ICD-10-CM | POA: Diagnosis not present

## 2021-08-22 DIAGNOSIS — R509 Fever, unspecified: Secondary | ICD-10-CM | POA: Diagnosis not present

## 2021-08-22 DIAGNOSIS — N200 Calculus of kidney: Secondary | ICD-10-CM | POA: Insufficient documentation

## 2021-08-22 DIAGNOSIS — E119 Type 2 diabetes mellitus without complications: Secondary | ICD-10-CM | POA: Diagnosis not present

## 2021-08-22 DIAGNOSIS — R6510 Systemic inflammatory response syndrome (SIRS) of non-infectious origin without acute organ dysfunction: Secondary | ICD-10-CM | POA: Diagnosis not present

## 2021-08-22 DIAGNOSIS — N390 Urinary tract infection, site not specified: Secondary | ICD-10-CM | POA: Diagnosis not present

## 2021-08-22 DIAGNOSIS — A419 Sepsis, unspecified organism: Secondary | ICD-10-CM | POA: Diagnosis not present

## 2021-08-22 DIAGNOSIS — N1330 Unspecified hydronephrosis: Secondary | ICD-10-CM | POA: Diagnosis not present

## 2021-08-22 DIAGNOSIS — Z96642 Presence of left artificial hip joint: Secondary | ICD-10-CM | POA: Diagnosis not present

## 2021-08-22 DIAGNOSIS — N139 Obstructive and reflux uropathy, unspecified: Secondary | ICD-10-CM | POA: Diagnosis not present

## 2021-08-22 DIAGNOSIS — S81802A Unspecified open wound, left lower leg, initial encounter: Secondary | ICD-10-CM | POA: Diagnosis not present

## 2021-08-22 DIAGNOSIS — N2889 Other specified disorders of kidney and ureter: Secondary | ICD-10-CM | POA: Diagnosis not present

## 2021-08-22 HISTORY — DX: Calculus of kidney: N20.0

## 2021-08-23 DIAGNOSIS — I5032 Chronic diastolic (congestive) heart failure: Secondary | ICD-10-CM | POA: Diagnosis not present

## 2021-08-23 DIAGNOSIS — I959 Hypotension, unspecified: Secondary | ICD-10-CM | POA: Diagnosis not present

## 2021-08-23 DIAGNOSIS — N200 Calculus of kidney: Secondary | ICD-10-CM | POA: Diagnosis not present

## 2021-08-23 DIAGNOSIS — N136 Pyonephrosis: Secondary | ICD-10-CM | POA: Diagnosis not present

## 2021-08-23 DIAGNOSIS — R778 Other specified abnormalities of plasma proteins: Secondary | ICD-10-CM | POA: Diagnosis not present

## 2021-08-23 DIAGNOSIS — B9620 Unspecified Escherichia coli [E. coli] as the cause of diseases classified elsewhere: Secondary | ICD-10-CM | POA: Diagnosis not present

## 2021-08-23 DIAGNOSIS — I4510 Unspecified right bundle-branch block: Secondary | ICD-10-CM | POA: Diagnosis not present

## 2021-08-23 DIAGNOSIS — E785 Hyperlipidemia, unspecified: Secondary | ICD-10-CM | POA: Diagnosis not present

## 2021-08-23 DIAGNOSIS — D696 Thrombocytopenia, unspecified: Secondary | ICD-10-CM | POA: Diagnosis not present

## 2021-08-23 DIAGNOSIS — I5030 Unspecified diastolic (congestive) heart failure: Secondary | ICD-10-CM | POA: Diagnosis not present

## 2021-08-23 DIAGNOSIS — Z7982 Long term (current) use of aspirin: Secondary | ICD-10-CM | POA: Diagnosis not present

## 2021-08-23 DIAGNOSIS — J810 Acute pulmonary edema: Secondary | ICD-10-CM | POA: Diagnosis not present

## 2021-08-23 DIAGNOSIS — Z7984 Long term (current) use of oral hypoglycemic drugs: Secondary | ICD-10-CM | POA: Diagnosis not present

## 2021-08-23 DIAGNOSIS — N2889 Other specified disorders of kidney and ureter: Secondary | ICD-10-CM | POA: Diagnosis not present

## 2021-08-23 DIAGNOSIS — J9601 Acute respiratory failure with hypoxia: Secondary | ICD-10-CM | POA: Diagnosis not present

## 2021-08-23 DIAGNOSIS — R079 Chest pain, unspecified: Secondary | ICD-10-CM | POA: Diagnosis not present

## 2021-08-23 DIAGNOSIS — A4151 Sepsis due to Escherichia coli [E. coli]: Secondary | ICD-10-CM | POA: Diagnosis not present

## 2021-08-23 DIAGNOSIS — Z96642 Presence of left artificial hip joint: Secondary | ICD-10-CM | POA: Diagnosis not present

## 2021-08-23 DIAGNOSIS — I25708 Atherosclerosis of coronary artery bypass graft(s), unspecified, with other forms of angina pectoris: Secondary | ICD-10-CM | POA: Diagnosis not present

## 2021-08-23 DIAGNOSIS — I214 Non-ST elevation (NSTEMI) myocardial infarction: Secondary | ICD-10-CM | POA: Diagnosis not present

## 2021-08-23 DIAGNOSIS — I77810 Thoracic aortic ectasia: Secondary | ICD-10-CM | POA: Diagnosis not present

## 2021-08-23 DIAGNOSIS — R0789 Other chest pain: Secondary | ICD-10-CM | POA: Diagnosis not present

## 2021-08-23 DIAGNOSIS — Z96653 Presence of artificial knee joint, bilateral: Secondary | ICD-10-CM | POA: Diagnosis not present

## 2021-08-23 DIAGNOSIS — Z955 Presence of coronary angioplasty implant and graft: Secondary | ICD-10-CM | POA: Diagnosis not present

## 2021-08-23 DIAGNOSIS — D72819 Decreased white blood cell count, unspecified: Secondary | ICD-10-CM | POA: Diagnosis not present

## 2021-08-23 DIAGNOSIS — R6521 Severe sepsis with septic shock: Secondary | ICD-10-CM | POA: Diagnosis not present

## 2021-08-23 DIAGNOSIS — S81802A Unspecified open wound, left lower leg, initial encounter: Secondary | ICD-10-CM | POA: Diagnosis not present

## 2021-08-23 DIAGNOSIS — I272 Pulmonary hypertension, unspecified: Secondary | ICD-10-CM | POA: Diagnosis not present

## 2021-08-23 DIAGNOSIS — I083 Combined rheumatic disorders of mitral, aortic and tricuspid valves: Secondary | ICD-10-CM | POA: Diagnosis not present

## 2021-08-23 DIAGNOSIS — A419 Sepsis, unspecified organism: Secondary | ICD-10-CM | POA: Diagnosis not present

## 2021-08-23 DIAGNOSIS — I25118 Atherosclerotic heart disease of native coronary artery with other forms of angina pectoris: Secondary | ICD-10-CM | POA: Diagnosis not present

## 2021-08-23 DIAGNOSIS — R918 Other nonspecific abnormal finding of lung field: Secondary | ICD-10-CM | POA: Diagnosis not present

## 2021-08-23 DIAGNOSIS — I440 Atrioventricular block, first degree: Secondary | ICD-10-CM | POA: Diagnosis not present

## 2021-08-23 DIAGNOSIS — N201 Calculus of ureter: Secondary | ICD-10-CM | POA: Diagnosis not present

## 2021-08-23 DIAGNOSIS — N1330 Unspecified hydronephrosis: Secondary | ICD-10-CM | POA: Diagnosis not present

## 2021-08-23 DIAGNOSIS — Z87442 Personal history of urinary calculi: Secondary | ICD-10-CM | POA: Diagnosis not present

## 2021-08-23 DIAGNOSIS — I071 Rheumatic tricuspid insufficiency: Secondary | ICD-10-CM | POA: Diagnosis not present

## 2021-08-23 DIAGNOSIS — I25810 Atherosclerosis of coronary artery bypass graft(s) without angina pectoris: Secondary | ICD-10-CM | POA: Diagnosis not present

## 2021-08-23 DIAGNOSIS — R509 Fever, unspecified: Secondary | ICD-10-CM | POA: Diagnosis not present

## 2021-08-23 DIAGNOSIS — D65 Disseminated intravascular coagulation [defibrination syndrome]: Secondary | ICD-10-CM | POA: Diagnosis not present

## 2021-08-23 DIAGNOSIS — I21A1 Myocardial infarction type 2: Secondary | ICD-10-CM | POA: Diagnosis not present

## 2021-08-23 DIAGNOSIS — I2510 Atherosclerotic heart disease of native coronary artery without angina pectoris: Secondary | ICD-10-CM | POA: Diagnosis not present

## 2021-08-23 DIAGNOSIS — N139 Obstructive and reflux uropathy, unspecified: Secondary | ICD-10-CM | POA: Diagnosis not present

## 2021-08-23 DIAGNOSIS — E119 Type 2 diabetes mellitus without complications: Secondary | ICD-10-CM | POA: Diagnosis not present

## 2021-08-23 DIAGNOSIS — Z79899 Other long term (current) drug therapy: Secondary | ICD-10-CM | POA: Diagnosis not present

## 2021-08-23 DIAGNOSIS — I5033 Acute on chronic diastolic (congestive) heart failure: Secondary | ICD-10-CM | POA: Diagnosis not present

## 2021-08-23 DIAGNOSIS — N1339 Other hydronephrosis: Secondary | ICD-10-CM | POA: Diagnosis not present

## 2021-08-23 DIAGNOSIS — N202 Calculus of kidney with calculus of ureter: Secondary | ICD-10-CM | POA: Diagnosis not present

## 2021-08-23 DIAGNOSIS — N390 Urinary tract infection, site not specified: Secondary | ICD-10-CM | POA: Diagnosis not present

## 2021-08-23 DIAGNOSIS — Z7902 Long term (current) use of antithrombotics/antiplatelets: Secondary | ICD-10-CM | POA: Diagnosis not present

## 2021-08-23 DIAGNOSIS — I110 Hypertensive heart disease with heart failure: Secondary | ICD-10-CM | POA: Diagnosis not present

## 2021-08-23 DIAGNOSIS — Z951 Presence of aortocoronary bypass graft: Secondary | ICD-10-CM | POA: Diagnosis not present

## 2021-08-23 DIAGNOSIS — B965 Pseudomonas (aeruginosa) (mallei) (pseudomallei) as the cause of diseases classified elsewhere: Secondary | ICD-10-CM | POA: Diagnosis not present

## 2021-08-23 DIAGNOSIS — I493 Ventricular premature depolarization: Secondary | ICD-10-CM | POA: Diagnosis not present

## 2021-08-23 DIAGNOSIS — J90 Pleural effusion, not elsewhere classified: Secondary | ICD-10-CM | POA: Diagnosis not present

## 2021-08-23 DIAGNOSIS — R7401 Elevation of levels of liver transaminase levels: Secondary | ICD-10-CM | POA: Diagnosis not present

## 2021-08-23 DIAGNOSIS — K7460 Unspecified cirrhosis of liver: Secondary | ICD-10-CM | POA: Diagnosis not present

## 2021-08-23 DIAGNOSIS — I491 Atrial premature depolarization: Secondary | ICD-10-CM | POA: Diagnosis not present

## 2021-08-23 DIAGNOSIS — K7469 Other cirrhosis of liver: Secondary | ICD-10-CM | POA: Diagnosis not present

## 2021-08-23 DIAGNOSIS — R6510 Systemic inflammatory response syndrome (SIRS) of non-infectious origin without acute organ dysfunction: Secondary | ICD-10-CM | POA: Diagnosis not present

## 2021-08-24 DIAGNOSIS — I77810 Thoracic aortic ectasia: Secondary | ICD-10-CM | POA: Diagnosis not present

## 2021-08-24 DIAGNOSIS — I493 Ventricular premature depolarization: Secondary | ICD-10-CM | POA: Diagnosis not present

## 2021-08-24 DIAGNOSIS — Z951 Presence of aortocoronary bypass graft: Secondary | ICD-10-CM | POA: Diagnosis not present

## 2021-08-24 DIAGNOSIS — N200 Calculus of kidney: Secondary | ICD-10-CM | POA: Diagnosis not present

## 2021-08-24 DIAGNOSIS — I083 Combined rheumatic disorders of mitral, aortic and tricuspid valves: Secondary | ICD-10-CM | POA: Diagnosis not present

## 2021-08-24 DIAGNOSIS — R0789 Other chest pain: Secondary | ICD-10-CM | POA: Diagnosis not present

## 2021-08-24 DIAGNOSIS — Z7982 Long term (current) use of aspirin: Secondary | ICD-10-CM | POA: Diagnosis not present

## 2021-08-24 DIAGNOSIS — J810 Acute pulmonary edema: Secondary | ICD-10-CM | POA: Diagnosis not present

## 2021-08-24 DIAGNOSIS — I272 Pulmonary hypertension, unspecified: Secondary | ICD-10-CM | POA: Diagnosis not present

## 2021-08-24 DIAGNOSIS — I5033 Acute on chronic diastolic (congestive) heart failure: Secondary | ICD-10-CM | POA: Diagnosis not present

## 2021-08-24 DIAGNOSIS — Z79899 Other long term (current) drug therapy: Secondary | ICD-10-CM | POA: Diagnosis not present

## 2021-08-24 DIAGNOSIS — I4510 Unspecified right bundle-branch block: Secondary | ICD-10-CM | POA: Diagnosis not present

## 2021-08-24 DIAGNOSIS — I2510 Atherosclerotic heart disease of native coronary artery without angina pectoris: Secondary | ICD-10-CM | POA: Diagnosis not present

## 2021-08-25 DIAGNOSIS — R7401 Elevation of levels of liver transaminase levels: Secondary | ICD-10-CM | POA: Diagnosis not present

## 2021-08-25 DIAGNOSIS — A419 Sepsis, unspecified organism: Secondary | ICD-10-CM | POA: Diagnosis not present

## 2021-08-25 DIAGNOSIS — N390 Urinary tract infection, site not specified: Secondary | ICD-10-CM | POA: Diagnosis not present

## 2021-08-25 DIAGNOSIS — R6521 Severe sepsis with septic shock: Secondary | ICD-10-CM | POA: Diagnosis not present

## 2021-08-25 DIAGNOSIS — N200 Calculus of kidney: Secondary | ICD-10-CM | POA: Diagnosis not present

## 2021-08-25 DIAGNOSIS — D696 Thrombocytopenia, unspecified: Secondary | ICD-10-CM | POA: Diagnosis not present

## 2021-08-25 DIAGNOSIS — I214 Non-ST elevation (NSTEMI) myocardial infarction: Secondary | ICD-10-CM | POA: Diagnosis not present

## 2021-08-26 DIAGNOSIS — I493 Ventricular premature depolarization: Secondary | ICD-10-CM | POA: Diagnosis not present

## 2021-08-26 DIAGNOSIS — A419 Sepsis, unspecified organism: Secondary | ICD-10-CM | POA: Diagnosis not present

## 2021-08-26 DIAGNOSIS — R079 Chest pain, unspecified: Secondary | ICD-10-CM | POA: Diagnosis not present

## 2021-08-26 DIAGNOSIS — I491 Atrial premature depolarization: Secondary | ICD-10-CM | POA: Diagnosis not present

## 2021-08-26 DIAGNOSIS — B9620 Unspecified Escherichia coli [E. coli] as the cause of diseases classified elsewhere: Secondary | ICD-10-CM | POA: Diagnosis not present

## 2021-08-26 DIAGNOSIS — N390 Urinary tract infection, site not specified: Secondary | ICD-10-CM | POA: Diagnosis not present

## 2021-08-26 DIAGNOSIS — R6521 Severe sepsis with septic shock: Secondary | ICD-10-CM | POA: Diagnosis not present

## 2021-08-27 DIAGNOSIS — I2510 Atherosclerotic heart disease of native coronary artery without angina pectoris: Secondary | ICD-10-CM | POA: Diagnosis not present

## 2021-08-27 DIAGNOSIS — R778 Other specified abnormalities of plasma proteins: Secondary | ICD-10-CM | POA: Diagnosis not present

## 2021-08-27 DIAGNOSIS — N390 Urinary tract infection, site not specified: Secondary | ICD-10-CM | POA: Diagnosis not present

## 2021-08-27 DIAGNOSIS — Z951 Presence of aortocoronary bypass graft: Secondary | ICD-10-CM | POA: Diagnosis not present

## 2021-08-27 DIAGNOSIS — R079 Chest pain, unspecified: Secondary | ICD-10-CM | POA: Diagnosis not present

## 2021-08-27 DIAGNOSIS — I5030 Unspecified diastolic (congestive) heart failure: Secondary | ICD-10-CM | POA: Diagnosis not present

## 2021-08-27 DIAGNOSIS — K7469 Other cirrhosis of liver: Secondary | ICD-10-CM | POA: Diagnosis not present

## 2021-08-27 DIAGNOSIS — N200 Calculus of kidney: Secondary | ICD-10-CM | POA: Diagnosis not present

## 2021-08-27 DIAGNOSIS — I110 Hypertensive heart disease with heart failure: Secondary | ICD-10-CM | POA: Diagnosis not present

## 2021-08-28 DIAGNOSIS — N200 Calculus of kidney: Secondary | ICD-10-CM | POA: Diagnosis not present

## 2021-08-28 DIAGNOSIS — Z951 Presence of aortocoronary bypass graft: Secondary | ICD-10-CM | POA: Diagnosis not present

## 2021-08-28 DIAGNOSIS — K7460 Unspecified cirrhosis of liver: Secondary | ICD-10-CM | POA: Diagnosis not present

## 2021-08-28 DIAGNOSIS — I2510 Atherosclerotic heart disease of native coronary artery without angina pectoris: Secondary | ICD-10-CM | POA: Diagnosis not present

## 2021-08-28 DIAGNOSIS — I110 Hypertensive heart disease with heart failure: Secondary | ICD-10-CM | POA: Diagnosis not present

## 2021-08-28 DIAGNOSIS — I491 Atrial premature depolarization: Secondary | ICD-10-CM | POA: Diagnosis not present

## 2021-08-28 DIAGNOSIS — N390 Urinary tract infection, site not specified: Secondary | ICD-10-CM | POA: Diagnosis not present

## 2021-08-28 DIAGNOSIS — I493 Ventricular premature depolarization: Secondary | ICD-10-CM | POA: Diagnosis not present

## 2021-08-28 DIAGNOSIS — I5030 Unspecified diastolic (congestive) heart failure: Secondary | ICD-10-CM | POA: Diagnosis not present

## 2021-08-28 DIAGNOSIS — Z955 Presence of coronary angioplasty implant and graft: Secondary | ICD-10-CM | POA: Diagnosis not present

## 2021-08-28 DIAGNOSIS — I21A1 Myocardial infarction type 2: Secondary | ICD-10-CM | POA: Diagnosis not present

## 2021-09-01 DIAGNOSIS — Z6822 Body mass index (BMI) 22.0-22.9, adult: Secondary | ICD-10-CM | POA: Diagnosis not present

## 2021-09-01 DIAGNOSIS — I872 Venous insufficiency (chronic) (peripheral): Secondary | ICD-10-CM | POA: Diagnosis not present

## 2021-09-01 DIAGNOSIS — Z8619 Personal history of other infectious and parasitic diseases: Secondary | ICD-10-CM | POA: Diagnosis not present

## 2021-09-01 DIAGNOSIS — I25119 Atherosclerotic heart disease of native coronary artery with unspecified angina pectoris: Secondary | ICD-10-CM | POA: Diagnosis not present

## 2021-09-01 DIAGNOSIS — L97822 Non-pressure chronic ulcer of other part of left lower leg with fat layer exposed: Secondary | ICD-10-CM | POA: Diagnosis not present

## 2021-09-01 DIAGNOSIS — E11622 Type 2 diabetes mellitus with other skin ulcer: Secondary | ICD-10-CM | POA: Diagnosis not present

## 2021-09-01 DIAGNOSIS — E1159 Type 2 diabetes mellitus with other circulatory complications: Secondary | ICD-10-CM | POA: Diagnosis not present

## 2021-09-01 DIAGNOSIS — Z7689 Persons encountering health services in other specified circumstances: Secondary | ICD-10-CM | POA: Diagnosis not present

## 2021-09-07 ENCOUNTER — Ambulatory Visit: Payer: Medicare HMO | Attending: Cardiology | Admitting: Cardiology

## 2021-09-07 ENCOUNTER — Encounter: Payer: Self-pay | Admitting: Cardiology

## 2021-09-07 VITALS — BP 110/58 | HR 68 | Ht 66.0 in | Wt 156.4 lb

## 2021-09-07 DIAGNOSIS — E088 Diabetes mellitus due to underlying condition with unspecified complications: Secondary | ICD-10-CM

## 2021-09-07 DIAGNOSIS — I10 Essential (primary) hypertension: Secondary | ICD-10-CM | POA: Diagnosis not present

## 2021-09-07 DIAGNOSIS — I25709 Atherosclerosis of coronary artery bypass graft(s), unspecified, with unspecified angina pectoris: Secondary | ICD-10-CM

## 2021-09-07 DIAGNOSIS — Z951 Presence of aortocoronary bypass graft: Secondary | ICD-10-CM

## 2021-09-07 NOTE — Patient Instructions (Signed)

## 2021-09-07 NOTE — Progress Notes (Signed)
Cardiology Office Note:    Date:  09/07/2021   ID:  Mary [name], DOB 17-Mar-1940, MRN [id number]  PCP:  Charlott Rakes, MD  Cardiologist:  Garwin Brothers, MD   Referring MD: Charlott Rakes, MD    ASSESSMENT:    1. Coronary artery disease involving coronary bypass graft of native heart with angina pectoris (HCC)   2. Essential (primary) hypertension   3. Diabetes mellitus due to underlying condition with unspecified complications (HCC)   4. H/O coronary artery bypass surgery    PLAN:    In order of problems listed above:  Coronary artery disease: Secondary prevention stressed with the patient.  Importance of compliance with diet and medication stressed any vocalized understanding.  She was advised to walk at least half an hour a day 5 days a week and she promises to do so. Essential hypertension: Blood pressure stable and diet was emphasized. Mixed dyslipidemia: On lipid-lowering therapy.  Lipids were reviewed from [facility] sheet. Abnormal abdominal CT.  This is followed by primary care.  She underwent MRI scanning and is monitored closely by primary care and urologist. Patient will be seen in follow-up appointment in 6 months or earlier if the patient has any concerns    Medication Adjustments/Labs and Tests Ordered: Current medicines are reviewed at length with the patient today.  Concerns regarding medicines are outlined above.  No orders of the defined types were placed in this encounter.  No orders of the defined types were placed in this encounter.    No chief complaint on file.    History of Present Illness:    Mary [name] is a 81 y.o. female.  Patient has past medical history of coronary artery disease, essential hypertension, mixed dyslipidemia and diabetes mellitus.  She denies any problems at this time and takes care of activities of daily living.  No chest pain orthopnea or PND.  At the time of my evaluation, the patient is alert awake oriented and in no  distress.  Past Medical History:  Diagnosis Date   Arthritis    Ascending aortic aneurysm (HCC) 11/22/2019   CAD in native artery 09/18/2014   Cellulitis 04/06/2014   Distal right lower extremity    Coronary artery disease    Coronary artery disease involving coronary bypass graft of native heart with angina pectoris (HCC) 07/31/2018   Coronavirus infection 10/10/2018   Formatting of this note might be different from the original. This patient presented to the [facility] on 10/07/2018 where she was tested for COVID-19. The patient's POS COVID-19 test resulted on 10/10/2018. The patient's granddaughter, on HIPAA, was contacted by Alfonzo Beers PA-C for notification on 10/10/2018 at 0935 hr.  Granddaughter reports patient lives at home w   Diabetes mellitus due to underlying condition with unspecified complications (HCC) 04/06/2014   Diabetes mellitus without complication (HCC)    DM II   Dyslipidemia 09/18/2014   Essential (primary) hypertension 09/18/2014   Fever 04/06/2014   H/O coronary artery bypass surgery 09/24/2014   H/O transfusion    Hyperlipemia    Hyponatremia 04/06/2014   Hypoxia 04/06/2014   Primary osteoarthritis of knee 06/02/2015   Primary osteoarthritis of right knee 03/29/2014    Past Surgical History:  Procedure Laterality Date   BACK SURGERY  1992   CARDIAC CATHETERIZATION     CARPAL TUNNEL RELEASE Left    COLONOSCOPY     CORONARY ARTERY BYPASS GRAFT  11/10/1994   x 3   EYE SURGERY Bilateral  Glaucoma   TOTAL KNEE ARTHROPLASTY Right 04/01/2014   Procedure: RIGHT TOTAL KNEE ARTHROPLASTY;  Surgeon: Gean Birchwood, MD;  Location: [facility] OR;  Service: Orthopedics;  Laterality: Right;   TOTAL KNEE ARTHROPLASTY Left 06/02/2015   Procedure: LEFT TOTAL KNEE ARTHROPLASTY;  Surgeon: Gean Birchwood, MD;  Location: [facility] OR;  Service: Orthopedics;  Laterality: Left;   TUBAL LIGATION      Current Medications: Current Meds  Medication Sig   amLODipine (NORVASC) 10  MG tablet Take 1 tablet (10 mg total) by mouth daily in the afternoon.   aspirin EC 81 MG tablet Take 81 mg by mouth daily.   cefdinir (OMNICEF) 300 MG capsule Take 300 mg by mouth 2 (two) times daily.   cloNIDine (CATAPRES) 0.2 MG tablet Take 1 tablet by mouth twice daily   clopidogrel (PLAVIX) 75 MG tablet Take 75 mg by mouth daily.   esomeprazole (NEXIUM) 40 MG capsule Take 40 mg by mouth daily.   fenofibrate (TRICOR) 145 MG tablet Take 1 tablet (145 mg total) by mouth daily.   hydrOXYzine (VISTARIL) 25 MG capsule Take 25 mg by mouth 3 (three) times daily as needed for itching.   isosorbide mononitrate (IMDUR) 60 MG 24 hr tablet Take 1 tablet (60 mg total) by mouth daily.   metoprolol succinate (TOPROL-XL) 50 MG 24 hr tablet Take 1 tablet (50 mg total) by mouth daily.   nitroGLYCERIN (NITROSTAT) 0.4 MG SL tablet Place 0.4 mg under the tongue every 5 (five) minutes as needed for chest pain.   ranolazine (RANEXA) 1000 MG SR tablet Take 1 tablet (1,000 mg total) by mouth 2 (two) times daily.   rosuvastatin (CRESTOR) 10 MG tablet Take 10 mg by mouth daily.   SYNJARDY XR 25-1000 MG TB24 Take 1 tablet by mouth daily.   valsartan (DIOVAN) 320 MG tablet Take 320 mg by mouth daily.     Allergies:   Patient has no known allergies.   Social History   Socioeconomic History   Marital status: Single    Spouse name: Not on file   Number of children: Not on file   Years of education: Not on file   Highest education level: Not on file  Occupational History   Not on file  Tobacco Use   Smoking status: Former    Packs/day: 0.50    Years: 5.00    Total pack years: 2.50    Types: Cigarettes    Quit date: 04/05/1985    Years since quitting: 36.4   Smokeless tobacco: Never  Vaping Use   Vaping Use: Never used  Substance and Sexual Activity   Alcohol use: No   Drug use: No   Sexual activity: Never  Other Topics Concern   Not on file  Social History Narrative   Not on file   Social  Determinants of Health   Financial Resource Strain: Not on file  Food Insecurity: Not on file  Transportation Needs: Not on file  Physical Activity: Not on file  Stress: Not on file  Social Connections: Not on file     Family History: The patient's family history includes Diabetes in her father; Heart Problems in her mother; High Cholesterol in her mother; Hypertension in her brother; Stroke in her mother.  ROS:   Please see the history of present illness.    All other systems reviewed and are negative.  EKGs/Labs/Other Studies Reviewed:    The following studies were reviewed today: I discussed my findings with the patient at length.  Recent Labs: 07/22/2021: ALT 9; BUN 18; Creatinine, Ser 1.13; Hemoglobin 12.3; Platelets 227; Potassium 5.0; Sodium 136; TSH 1.940  Recent Lipid Panel    Component Value Date/Time   CHOL 176 07/22/2021 0928   TRIG 147 07/22/2021 0928   HDL 55 07/22/2021 0928   CHOLHDL 3.2 07/22/2021 0928   LDLCALC 95 07/22/2021 0928    Physical Exam:    VS:  BP (!) 110/58   Pulse 68   Ht 5\' 6"  (1.676 m)   Wt 156 lb 6.4 oz (70.9 kg)   SpO2 96%   BMI 25.24 kg/m     Wt Readings from Last 3 Encounters:  09/07/21 156 lb 6.4 oz (70.9 kg)  07/17/21 156 lb (70.8 kg)  11/22/19 163 lb 12.8 oz (74.3 kg)     GEN: Patient is in no acute distress HEENT: Normal NECK: No JVD; No carotid bruits LYMPHATICS: No lymphadenopathy CARDIAC: Hear sounds regular, 2/6 systolic murmur at the apex. RESPIRATORY:  Clear to auscultation without rales, wheezing or rhonchi  ABDOMEN: Soft, non-tender, non-distended MUSCULOSKELETAL:  No edema; No deformity  SKIN: Warm and dry NEUROLOGIC:  Alert and oriented x 3 PSYCHIATRIC:  Normal affect   Signed, 13/11/21, MD  09/07/2021 1:46 PM    [facility]

## 2021-09-09 DIAGNOSIS — N200 Calculus of kidney: Secondary | ICD-10-CM | POA: Diagnosis not present

## 2021-09-15 DIAGNOSIS — L97812 Non-pressure chronic ulcer of other part of right lower leg with fat layer exposed: Secondary | ICD-10-CM | POA: Diagnosis not present

## 2021-09-15 DIAGNOSIS — E11622 Type 2 diabetes mellitus with other skin ulcer: Secondary | ICD-10-CM | POA: Diagnosis not present

## 2021-09-15 DIAGNOSIS — I872 Venous insufficiency (chronic) (peripheral): Secondary | ICD-10-CM | POA: Diagnosis not present

## 2021-09-15 DIAGNOSIS — L97822 Non-pressure chronic ulcer of other part of left lower leg with fat layer exposed: Secondary | ICD-10-CM | POA: Diagnosis not present

## 2021-09-22 DIAGNOSIS — R319 Hematuria, unspecified: Secondary | ICD-10-CM | POA: Diagnosis not present

## 2021-09-22 DIAGNOSIS — K5730 Diverticulosis of large intestine without perforation or abscess without bleeding: Secondary | ICD-10-CM | POA: Diagnosis not present

## 2021-09-22 DIAGNOSIS — Z87442 Personal history of urinary calculi: Secondary | ICD-10-CM | POA: Diagnosis not present

## 2021-09-22 DIAGNOSIS — Z960 Presence of urogenital implants: Secondary | ICD-10-CM | POA: Diagnosis not present

## 2021-09-22 DIAGNOSIS — R300 Dysuria: Secondary | ICD-10-CM | POA: Diagnosis not present

## 2021-09-22 DIAGNOSIS — N200 Calculus of kidney: Secondary | ICD-10-CM | POA: Diagnosis not present

## 2021-09-23 ENCOUNTER — Telehealth: Payer: Self-pay

## 2021-09-23 NOTE — Telephone Encounter (Signed)
        Patient  visited [place] ED on 9/12     Telephone encounter attempt :  1ST  A HIPAA compliant voice message was left requesting a return call.  Instructed patient to call back    Lenard Forth [facility] Guide, [facility], Care Management  [phone] [address] [place], [place], [place] [address] Phone: [phone] Email: [email or web address]

## 2021-09-24 ENCOUNTER — Other Ambulatory Visit: Payer: Self-pay | Admitting: Cardiology

## 2021-09-25 ENCOUNTER — Telehealth: Payer: Self-pay

## 2021-09-25 NOTE — Telephone Encounter (Signed)
        Patient  visited [place] ED on 9/12     Telephone encounter attempt :  2ND  A HIPAA compliant voice message was left requesting a return call.  Instructed patient to call back atat earliest convenience  .    Lenard Forth [facility] Guide, [facility], Care Management  [phone] [address] [place], [place], [place] [address] Phone: [phone] Email: [email or web address]

## 2021-10-05 DIAGNOSIS — I25119 Atherosclerotic heart disease of native coronary artery with unspecified angina pectoris: Secondary | ICD-10-CM | POA: Diagnosis not present

## 2021-10-05 DIAGNOSIS — Z7984 Long term (current) use of oral hypoglycemic drugs: Secondary | ICD-10-CM | POA: Diagnosis not present

## 2021-10-05 DIAGNOSIS — E119 Type 2 diabetes mellitus without complications: Secondary | ICD-10-CM | POA: Diagnosis not present

## 2021-10-05 DIAGNOSIS — I10 Essential (primary) hypertension: Secondary | ICD-10-CM | POA: Diagnosis not present

## 2021-10-05 DIAGNOSIS — I272 Pulmonary hypertension, unspecified: Secondary | ICD-10-CM | POA: Insufficient documentation

## 2021-10-05 DIAGNOSIS — N132 Hydronephrosis with renal and ureteral calculous obstruction: Secondary | ICD-10-CM | POA: Diagnosis not present

## 2021-10-05 HISTORY — DX: Pulmonary hypertension, unspecified: I27.20

## 2021-10-13 DIAGNOSIS — N132 Hydronephrosis with renal and ureteral calculous obstruction: Secondary | ICD-10-CM | POA: Diagnosis not present

## 2021-10-13 DIAGNOSIS — I10 Essential (primary) hypertension: Secondary | ICD-10-CM | POA: Diagnosis not present

## 2021-10-13 DIAGNOSIS — I272 Pulmonary hypertension, unspecified: Secondary | ICD-10-CM | POA: Diagnosis not present

## 2021-10-13 DIAGNOSIS — E119 Type 2 diabetes mellitus without complications: Secondary | ICD-10-CM | POA: Diagnosis not present

## 2021-10-13 DIAGNOSIS — I25119 Atherosclerotic heart disease of native coronary artery with unspecified angina pectoris: Secondary | ICD-10-CM | POA: Diagnosis not present

## 2021-10-13 DIAGNOSIS — Z7984 Long term (current) use of oral hypoglycemic drugs: Secondary | ICD-10-CM | POA: Diagnosis not present

## 2022-03-22 ENCOUNTER — Telehealth: Payer: Self-pay | Admitting: Cardiology

## 2022-03-22 DIAGNOSIS — I10 Essential (primary) hypertension: Secondary | ICD-10-CM

## 2022-03-22 MED ORDER — AMLODIPINE BESYLATE 10 MG PO TABS: 10.0000 mg | ORAL_TABLET | Freq: Every day | ORAL | 0 refills | Status: DC

## 2022-03-22 MED ORDER — ROSUVASTATIN CALCIUM 10 MG PO TABS: 10.0000 mg | ORAL_TABLET | Freq: Every day | ORAL | 0 refills | Status: DC

## 2022-03-22 MED ORDER — CLONIDINE HCL 0.2 MG PO TABS: 0.2000 mg | ORAL_TABLET | Freq: Two times a day (BID) | ORAL | 0 refills | Status: DC

## 2022-03-22 MED ORDER — FENOFIBRATE 145 MG PO TABS: 145.0000 mg | ORAL_TABLET | Freq: Every day | ORAL | 0 refills | Status: DC

## 2022-03-22 MED ORDER — ISOSORBIDE MONONITRATE ER 60 MG PO TB24: 60.0000 mg | ORAL_TABLET | Freq: Every day | ORAL | 0 refills | Status: AC

## 2022-03-22 MED ORDER — VALSARTAN 320 MG PO TABS: 320.0000 mg | ORAL_TABLET | Freq: Every day | ORAL | 0 refills | Status: DC

## 2022-03-22 MED ORDER — CLOPIDOGREL BISULFATE 75 MG PO TABS: 75.0000 mg | ORAL_TABLET | Freq: Every day | ORAL | 0 refills | Status: DC

## 2022-03-22 MED ORDER — RANOLAZINE ER 1000 MG PO TB12: 1000.0000 mg | ORAL_TABLET | Freq: Two times a day (BID) | ORAL | 0 refills | Status: DC

## 2022-03-22 MED ORDER — METOPROLOL SUCCINATE ER 50 MG PO TB24: 50.0000 mg | ORAL_TABLET | Freq: Every day | ORAL | 0 refills | Status: DC

## 2022-03-22 NOTE — Telephone Encounter (Signed)
 *  STAT* If patient is at the pharmacy, call can be transferred to refill team.   1. Which medications need to be refilled? (please list name of each medication and dose if known)   amLODipine (NORVASC) 10 MG tablet    cloNIDine (CATAPRES) 0.2 MG tablet    clopidogrel (PLAVIX) 75 MG tablet   hydrOXYzine (VISTARIL) 25 MG capsule  fenofibrate (TRICOR) 145 MG tablet   isosorbide mononitrate (IMDUR) 60 MG 24 hr tablet  metoprolol succinate (TOPROL-XL) 50 MG 24 hr tablet  ranolazine (RANEXA) 1000 MG SR tablet   rosuvastatin (CRESTOR) 10 MG tablet  valsartan (DIOVAN) 320 MG tablet   2. Which pharmacy/location (including street and city if local pharmacy) is medication to be sent to? [place], [place]    3. Do they need a 30 day or 90 day supply? 90 days

## 2022-03-22 NOTE — Telephone Encounter (Signed)
Refills of Amlodipine 10 mg, Clonidine 0.2 mg, Plavix 75 mg, Fenofibrate 145 mg, Imdur 60 mg, Metoprolol Succinate 50, Ranexa 1000 mg, Rosuvastatin 10 mg, and Valsartan 320 mg sent to [facility], [place].

## 2022-03-25 DIAGNOSIS — E782 Mixed hyperlipidemia: Secondary | ICD-10-CM | POA: Diagnosis not present

## 2022-03-25 DIAGNOSIS — E1129 Type 2 diabetes mellitus with other diabetic kidney complication: Secondary | ICD-10-CM | POA: Diagnosis not present

## 2022-03-25 LAB — COMPREHENSIVE METABOLIC PANEL: eGFR: 48

## 2022-03-26 LAB — HEMOGLOBIN A1C: Hemoglobin A1c: 7.3

## 2022-03-30 DIAGNOSIS — E782 Mixed hyperlipidemia: Secondary | ICD-10-CM | POA: Diagnosis not present

## 2022-03-30 DIAGNOSIS — N1831 Chronic kidney disease, stage 3a: Secondary | ICD-10-CM | POA: Diagnosis not present

## 2022-03-30 DIAGNOSIS — I152 Hypertension secondary to endocrine disorders: Secondary | ICD-10-CM | POA: Diagnosis not present

## 2022-03-30 DIAGNOSIS — Z6826 Body mass index (BMI) 26.0-26.9, adult: Secondary | ICD-10-CM | POA: Diagnosis not present

## 2022-03-30 DIAGNOSIS — E1159 Type 2 diabetes mellitus with other circulatory complications: Secondary | ICD-10-CM | POA: Diagnosis not present

## 2022-04-05 ENCOUNTER — Ambulatory Visit: Payer: Medicare HMO | Attending: Cardiology | Admitting: Cardiology

## 2022-04-05 VITALS — BP 124/72 | HR 60 | Ht 66.0 in | Wt 157.8 lb

## 2022-04-05 DIAGNOSIS — E785 Hyperlipidemia, unspecified: Secondary | ICD-10-CM | POA: Diagnosis not present

## 2022-04-05 DIAGNOSIS — Z951 Presence of aortocoronary bypass graft: Secondary | ICD-10-CM

## 2022-04-05 DIAGNOSIS — E088 Diabetes mellitus due to underlying condition with unspecified complications: Secondary | ICD-10-CM

## 2022-04-05 DIAGNOSIS — I10 Essential (primary) hypertension: Secondary | ICD-10-CM

## 2022-04-05 DIAGNOSIS — I7121 Aneurysm of the ascending aorta, without rupture: Secondary | ICD-10-CM

## 2022-04-05 NOTE — Patient Instructions (Addendum)
Instrucciones de medicamentos: Su mdico recomienda que contine con sus medicamentos actuales segn las indicaciones. Consulte la lista de medicamentos actuales que se le entreg hoy. *Si necesita un resurtido de sus medicamentos cardacos antes de su prxima cita, llame a su farmacia*   Trabajo de laboratorio: Ninguno ordenado Si se le realizaron anlisis de laboratorio (anlisis de [place]) hoy y sus pruebas son completamente normales, recibir sus resultados solo cuando:  Mensaje de MyChart (si tiene MyChart) Merla Riches copia en papel por correo Si tiene alguna prueba de laboratorio que es anormal o necesitamos cambiar su tratamiento, lo llamaremos para revisar los [place].   Pruebas/Procedimientos: La tomografa computarizada no cardaca (exploracin por TAC) es una radiografa especial no invasiva que produce imgenes transversales del cuerpo utilizando rayos X y [place] computadora. Las tomografas computarizadas ayudan a los mdicos a Retail buyer y Merchant navy officer. Para algunos exmenes de TC, se utiliza un material de contraste para mejorar la visibilidad en el rea del cuerpo que se estudia. Las tomografas computarizadas brindan mayor claridad y revelan ms detalles que los exmenes de rayos X regulares.  Llame para programar despus del 24/7/24. [facility] [place], [place] [address] [phone] opcin 7   Hacer un seguimiento: En [facility], usted y sus necesidades de salud son Cleotis Nipper prioridad. Como parte de nuestra misin continua de brindarle una atencin cardaca excepcional, hemos creado equipos de atencin de proveedores designados. Estos equipos de atencin incluyen a su cardilogo primario (mdico) y proveedores de Financial planner (APP, asistentes mdicos y enfermeras practicantes) que trabajan juntos para brindarle la atencin que necesita, cuando la necesita.  Recomendamos registrarse en el portal del paciente llamado  "MyChart". La informacin de registro se proporciona en este Resumen posterior a la visita. MyChart se [place] para conectarse con pacientes para visitas virtuales (telemedicina). Los [employer] de laboratorio/pruebas, notas de encuentros, prximas citas, etc. Tambin se pueden enviar mensajes no urgentes a su proveedor. Para obtener ms informacin sobre lo que [employer] con MyChart, vaya a [email or web address].  Su prxima cita: 9 meses)  El formato para su prxima cita: En persona  Proveedor: Dr. Sunny Schlein Revankar   Otras instrucciones N / A      Medication Instructions:  Your physician recommends that you continue on your current medications as directed. Please refer to the Current Medication list given to you today.  *If you need a refill on your cardiac medications before your next appointment, please call your pharmacy*   Lab Work: None ordered If you have labs (blood work) drawn today and your tests are completely normal, you will receive your results only by: [place] (if you have MyChart) OR A paper copy in the mail If you have any lab test that is abnormal or we need to change your treatment, we will call you to review the results.   Testing/Procedures: Non-Cardiac CT scanning, (CAT scanning), is a noninvasive, special x-ray that produces cross-sectional images of the body using x-rays and a computer. CT scans help physicians diagnose and treat medical conditions. For some CT exams, a contrast material is used to enhance visibility in the area of the body being studied. CT scans provide greater clarity and reveal more details than regular x-ray exams.   Please call to schedule after 08/04/22. [place], [place] [address] [phone] option 7   Follow-Up: At [facility], you and your health needs are our priority.  As part of our continuing mission  to provide you with exceptional heart care, we have created  designated Provider Care Teams.  These Care Teams include your primary Cardiologist (physician) and Advanced Practice Providers (APPs -  Physician Assistants and Nurse Practitioners) who all work together to provide you with the care you need, when you need it.  We recommend signing up for the patient portal called "MyChart".  Sign up information is provided on this After Visit Summary.  MyChart is used to connect with patients for Virtual Visits (Telemedicine).  Patients are able to view lab/test results, encounter notes, upcoming appointments, etc.  Non-urgent messages can be sent to your provider as well.   To learn more about what you can do with MyChart, go to [email or web address].    Your next appointment:   9 month(s)  The format for your next appointment:   In Person  Provider:   Jyl Heinz, MD   Other Instructions NA

## 2022-04-05 NOTE — Addendum Note (Signed)
Addended by: Truddie Hidden on: 04/05/2022 04:37 PM   Modules accepted: Orders

## 2022-04-05 NOTE — Progress Notes (Signed)
Cardiology Office Note:    Date:  04/05/2022   ID:  Mary [name], DOB April 09, 1940, MRN [id number]  PCP:  Maryella Shivers, MD  Cardiologist:  Jenean Lindau, MD   Referring MD: Maryella Shivers, MD    ASSESSMENT:    1. Essential (primary) hypertension   2. Diabetes mellitus due to underlying condition with unspecified complications ([place])   3. Dyslipidemia   4. H/O coronary artery bypass surgery    PLAN:    In order of problems listed above:  Coronary artery disease: Secondary prevention stressed with the patient.  Importance of compliance with diet medication stressed and she vocalized understanding.  She was advised to walk at least half in the room 5 days a week and she promises to do so. Dilated ascending aorta: CT scan was advised again and she is agreeable. Essential hypertension: Blood pressure stable and diet was emphasized. Mixed dyslipidemia: Lipids were reviewed and discussed with the patient at length questions were answered to her satisfaction. Diabetes mellitus: Uncontrolled: Hemoglobin A1c greater than 7.  Diet was emphasized.  This is managed by primary care provider. Patient will be seen in follow-up appointment in 6 months or earlier if the patient has any concerns.    Medication Adjustments/Labs and Tests Ordered: Current medicines are reviewed at length with the patient today.  Concerns regarding medicines are outlined above.  No orders of the defined types were placed in this encounter.  No orders of the defined types were placed in this encounter.    No chief complaint on file.    History of Present Illness:    Mary [name] is a 82 y.o. female.  Patient has past medical history of coronary artery disease post CABG surgery, essential hypertension, mixed dyslipidemia and diabetes mellitus.  She travels back home to [place] on a regular basis.  She had an ascending aorta dilated.  She did not get the CT scan done.  She denies any problems at this  time and takes care of activities of daily living.  No chest pain orthopnea or PND.  At the time of my evaluation, the patient is alert awake oriented and in no distress.  Past Medical History:  Diagnosis Date   Arthritis    Ascending aortic aneurysm ([place]) 11/22/2019   CAD in native artery 09/18/2014   Cellulitis 04/06/2014   Distal right lower extremity    Coronary artery disease    Coronary artery disease involving coronary bypass graft of native heart with angina pectoris ([place]) 07/31/2018   Coronavirus infection 10/10/2018   Formatting of this note might be different from the original. This patient presented to the [place] clinic on 10/07/2018 where she was tested for COVID-19. The patient's POS COVID-19 test resulted on 10/10/2018. The patient's granddaughter, on HIPAA, was contacted by Catalina Antigua PA-C for notification on 10/10/2018 at 0935 hr.  Granddaughter reports patient lives at home w   Diabetes mellitus due to underlying condition with unspecified complications ([place]) [id number]   Diabetes mellitus without complication ([place])    DM II   Dyslipidemia 09/18/2014   Essential (primary) hypertension 09/18/2014   Fever 04/06/2014   H/O coronary artery bypass surgery 09/24/2014   H/O transfusion    Hyperlipemia    Hyponatremia 04/06/2014   Hypoxia 04/06/2014   Nephrolithiasis 08/22/2021   Primary osteoarthritis of knee 06/02/2015   Primary osteoarthritis of right knee 03/29/2014   Pulmonary hypertension ([place]) 10/05/2021    Past Surgical History:  Procedure Laterality Date  BACK SURGERY  1992   CARDIAC CATHETERIZATION     CARPAL TUNNEL RELEASE Left    COLONOSCOPY     CORONARY ARTERY BYPASS GRAFT  11/10/1994   x 3   EYE SURGERY Bilateral    Glaucoma   TOTAL KNEE ARTHROPLASTY Right 04/01/2014   Procedure: RIGHT TOTAL KNEE ARTHROPLASTY;  Surgeon: Frederik Pear, MD;  Location: [facility];  Service: Orthopedics;  Laterality: Right;   TOTAL KNEE ARTHROPLASTY  Left 06/02/2015   Procedure: LEFT TOTAL KNEE ARTHROPLASTY;  Surgeon: Frederik Pear, MD;  Location: [facility];  Service: Orthopedics;  Laterality: Left;   TUBAL LIGATION      Current Medications: Current Meds  Medication Sig   amLODipine (NORVASC) 10 MG tablet Take 1 tablet (10 mg total) by mouth daily in the afternoon.   aspirin EC 81 MG tablet Take 81 mg by mouth daily.   cloNIDine (CATAPRES) 0.2 MG tablet Take 1 tablet (0.2 mg total) by mouth 2 (two) times daily.   clopidogrel (PLAVIX) 75 MG tablet Take 1 tablet (75 mg total) by mouth daily.   fenofibrate (TRICOR) 145 MG tablet Take 1 tablet (145 mg total) by mouth daily.   isosorbide mononitrate (IMDUR) 60 MG 24 hr tablet Take 1 tablet (60 mg total) by mouth daily.   metoprolol succinate (TOPROL-XL) 50 MG 24 hr tablet Take 1 tablet (50 mg total) by mouth daily.   nitroGLYCERIN (NITROSTAT) 0.4 MG SL tablet Place 1 tablet (0.4 mg total) under the tongue every 5 (five) minutes as needed for chest pain.   omeprazole (PRILOSEC) 20 MG capsule Take 20 mg by mouth daily.   ranolazine (RANEXA) 1000 MG SR tablet Take 1 tablet (1,000 mg total) by mouth 2 (two) times daily.   rosuvastatin (CRESTOR) 10 MG tablet Take 1 tablet (10 mg total) by mouth daily.   SYNJARDY XR 25-1000 MG TB24 Take 1 tablet by mouth daily.   valsartan (DIOVAN) 320 MG tablet Take 1 tablet (320 mg total) by mouth daily.     Allergies:   Patient has no known allergies.   Social History   Socioeconomic History   Marital status: Single    Spouse name: Not on file   Number of children: Not on file   Years of education: Not on file   Highest education level: Not on file  Occupational History   Not on file  Tobacco Use   Smoking status: Former    Packs/day: 0.50    Years: 5.00    Additional pack years: 0.00    Total pack years: 2.50    Types: Cigarettes    Quit date: 04/05/1985    Years since quitting: 37.0   Smokeless tobacco: Never  Vaping Use   Vaping Use: Never used   Substance and Sexual Activity   Alcohol use: No   Drug use: No   Sexual activity: Never  Other Topics Concern   Not on file  Social History Narrative   Not on file   Social Determinants of Health   Financial Resource Strain: Not on file  Food Insecurity: Not on file  Transportation Needs: Not on file  Physical Activity: Not on file  Stress: Not on file  Social Connections: Not on file     Family History: The patient's family history includes Diabetes in her father; Heart Problems in her mother; High Cholesterol in her mother; Hypertension in her brother; Stroke in her mother.  ROS:   Please see the history of present illness.  All other systems reviewed and are negative.  EKGs/Labs/Other Studies Reviewed:    The following studies were reviewed today: I discussed my findings with the patient at length.  EKG revealed sinus rhythm and nonspecific ST-T changes.   Recent Labs: 07/22/2021: ALT 9; BUN 18; Creatinine, Ser 1.13; Hemoglobin 12.3; Platelets 227; Potassium 5.0; Sodium 136; TSH 1.940  Recent Lipid Panel    Component Value Date/Time   CHOL 176 07/22/2021 0928   TRIG 147 07/22/2021 0928   HDL 55 07/22/2021 0928   CHOLHDL 3.2 07/22/2021 0928   LDLCALC 95 07/22/2021 0928    Physical Exam:    VS:  BP 124/72   Pulse 60   Ht 5\' 6"  (1.676 m)   Wt 157 lb 12.8 oz (71.6 kg)   SpO2 94%   BMI 25.47 kg/m     Wt Readings from Last 3 Encounters:  04/05/22 157 lb 12.8 oz (71.6 kg)  09/07/21 156 lb 6.4 oz (70.9 kg)  07/17/21 156 lb (70.8 kg)     GEN: Patient is in no acute distress HEENT: Normal NECK: No JVD; No carotid bruits LYMPHATICS: No lymphadenopathy CARDIAC: Hear sounds regular, 2/6 systolic murmur at the apex. RESPIRATORY:  Clear to auscultation without rales, wheezing or rhonchi  ABDOMEN: Soft, non-tender, non-distended MUSCULOSKELETAL:  No edema; No deformity  SKIN: Warm and dry NEUROLOGIC:  Alert and oriented x 3 PSYCHIATRIC:  Normal affect    Signed, Jenean Lindau, MD  04/05/2022 4:23 PM    [place]

## 2022-04-08 DIAGNOSIS — H903 Sensorineural hearing loss, bilateral: Secondary | ICD-10-CM | POA: Diagnosis not present

## 2022-04-16 DIAGNOSIS — E113392 Type 2 diabetes mellitus with moderate nonproliferative diabetic retinopathy without macular edema, left eye: Secondary | ICD-10-CM | POA: Diagnosis not present

## 2022-04-16 DIAGNOSIS — H5203 Hypermetropia, bilateral: Secondary | ICD-10-CM | POA: Diagnosis not present

## 2022-04-16 DIAGNOSIS — Z7984 Long term (current) use of oral hypoglycemic drugs: Secondary | ICD-10-CM | POA: Diagnosis not present

## 2022-04-16 DIAGNOSIS — Z0100 Encounter for examination of eyes and vision without abnormal findings: Secondary | ICD-10-CM | POA: Diagnosis not present

## 2022-04-19 DIAGNOSIS — H903 Sensorineural hearing loss, bilateral: Secondary | ICD-10-CM | POA: Diagnosis not present

## 2022-04-20 DIAGNOSIS — B359 Dermatophytosis, unspecified: Secondary | ICD-10-CM | POA: Diagnosis not present

## 2022-04-20 DIAGNOSIS — I25119 Atherosclerotic heart disease of native coronary artery with unspecified angina pectoris: Secondary | ICD-10-CM | POA: Diagnosis not present

## 2022-04-20 DIAGNOSIS — Z6827 Body mass index (BMI) 27.0-27.9, adult: Secondary | ICD-10-CM | POA: Diagnosis not present

## 2022-04-20 DIAGNOSIS — E1159 Type 2 diabetes mellitus with other circulatory complications: Secondary | ICD-10-CM | POA: Diagnosis not present

## 2022-04-20 DIAGNOSIS — I152 Hypertension secondary to endocrine disorders: Secondary | ICD-10-CM | POA: Diagnosis not present

## 2022-05-05 ENCOUNTER — Ambulatory Visit: Payer: Medicare HMO | Admitting: Cardiology

## 2022-06-05 ENCOUNTER — Other Ambulatory Visit: Payer: Self-pay | Admitting: Cardiology

## 2022-07-13 ENCOUNTER — Other Ambulatory Visit: Payer: Self-pay | Admitting: Cardiology

## 2022-07-13 NOTE — Telephone Encounter (Signed)
Rx refill sent to pharmacy. 

## 2022-08-17 ENCOUNTER — Other Ambulatory Visit: Payer: Self-pay | Admitting: Cardiology

## 2022-09-07 DIAGNOSIS — Z0000 Encounter for general adult medical examination without abnormal findings: Secondary | ICD-10-CM | POA: Diagnosis not present

## 2022-09-07 DIAGNOSIS — E782 Mixed hyperlipidemia: Secondary | ICD-10-CM | POA: Diagnosis not present

## 2022-09-07 DIAGNOSIS — E1129 Type 2 diabetes mellitus with other diabetic kidney complication: Secondary | ICD-10-CM | POA: Diagnosis not present

## 2022-09-07 DIAGNOSIS — Z9181 History of falling: Secondary | ICD-10-CM | POA: Diagnosis not present

## 2022-09-07 DIAGNOSIS — Z6826 Body mass index (BMI) 26.0-26.9, adult: Secondary | ICD-10-CM | POA: Diagnosis not present

## 2022-09-14 DIAGNOSIS — Z6826 Body mass index (BMI) 26.0-26.9, adult: Secondary | ICD-10-CM | POA: Diagnosis not present

## 2022-09-14 DIAGNOSIS — I10 Essential (primary) hypertension: Secondary | ICD-10-CM | POA: Diagnosis not present

## 2022-09-14 DIAGNOSIS — E782 Mixed hyperlipidemia: Secondary | ICD-10-CM | POA: Diagnosis not present

## 2022-09-14 DIAGNOSIS — E1129 Type 2 diabetes mellitus with other diabetic kidney complication: Secondary | ICD-10-CM | POA: Diagnosis not present

## 2022-10-14 ENCOUNTER — Other Ambulatory Visit: Payer: Self-pay | Admitting: Cardiology

## 2022-11-19 ENCOUNTER — Other Ambulatory Visit: Payer: Self-pay | Admitting: Cardiology

## 2022-11-26 DIAGNOSIS — Z6826 Body mass index (BMI) 26.0-26.9, adult: Secondary | ICD-10-CM | POA: Diagnosis not present

## 2022-11-26 DIAGNOSIS — I878 Other specified disorders of veins: Secondary | ICD-10-CM | POA: Diagnosis not present

## 2022-11-26 DIAGNOSIS — N1831 Chronic kidney disease, stage 3a: Secondary | ICD-10-CM | POA: Diagnosis not present

## 2022-12-13 ENCOUNTER — Other Ambulatory Visit: Payer: Self-pay | Admitting: Cardiology

## 2022-12-16 ENCOUNTER — Other Ambulatory Visit: Payer: Self-pay | Admitting: Cardiology

## 2022-12-16 DIAGNOSIS — I10 Essential (primary) hypertension: Secondary | ICD-10-CM

## 2023-01-30 DIAGNOSIS — R051 Acute cough: Secondary | ICD-10-CM | POA: Diagnosis not present

## 2023-01-30 DIAGNOSIS — J3489 Other specified disorders of nose and nasal sinuses: Secondary | ICD-10-CM | POA: Diagnosis not present

## 2023-01-30 DIAGNOSIS — R0981 Nasal congestion: Secondary | ICD-10-CM | POA: Diagnosis not present

## 2023-02-15 DIAGNOSIS — E1159 Type 2 diabetes mellitus with other circulatory complications: Secondary | ICD-10-CM | POA: Diagnosis not present

## 2023-02-15 DIAGNOSIS — E782 Mixed hyperlipidemia: Secondary | ICD-10-CM | POA: Diagnosis not present

## 2023-02-15 DIAGNOSIS — Z6825 Body mass index (BMI) 25.0-25.9, adult: Secondary | ICD-10-CM | POA: Diagnosis not present

## 2023-02-15 DIAGNOSIS — L97919 Non-pressure chronic ulcer of unspecified part of right lower leg with unspecified severity: Secondary | ICD-10-CM | POA: Diagnosis not present

## 2023-02-15 DIAGNOSIS — I152 Hypertension secondary to endocrine disorders: Secondary | ICD-10-CM | POA: Diagnosis not present

## 2023-02-16 DIAGNOSIS — E1159 Type 2 diabetes mellitus with other circulatory complications: Secondary | ICD-10-CM | POA: Diagnosis not present

## 2023-02-16 DIAGNOSIS — I152 Hypertension secondary to endocrine disorders: Secondary | ICD-10-CM | POA: Diagnosis not present

## 2023-02-18 ENCOUNTER — Other Ambulatory Visit: Payer: Self-pay | Admitting: Cardiology

## 2023-02-22 DIAGNOSIS — Z6825 Body mass index (BMI) 25.0-25.9, adult: Secondary | ICD-10-CM | POA: Diagnosis not present

## 2023-02-22 DIAGNOSIS — Z96651 Presence of right artificial knee joint: Secondary | ICD-10-CM | POA: Diagnosis not present

## 2023-02-22 DIAGNOSIS — N289 Disorder of kidney and ureter, unspecified: Secondary | ICD-10-CM | POA: Diagnosis not present

## 2023-02-22 DIAGNOSIS — L97919 Non-pressure chronic ulcer of unspecified part of right lower leg with unspecified severity: Secondary | ICD-10-CM | POA: Diagnosis not present

## 2023-02-22 DIAGNOSIS — M7989 Other specified soft tissue disorders: Secondary | ICD-10-CM | POA: Diagnosis not present

## 2023-02-22 DIAGNOSIS — L97219 Non-pressure chronic ulcer of right calf with unspecified severity: Secondary | ICD-10-CM | POA: Diagnosis not present

## 2023-02-22 DIAGNOSIS — E782 Mixed hyperlipidemia: Secondary | ICD-10-CM | POA: Diagnosis not present

## 2023-02-22 DIAGNOSIS — I152 Hypertension secondary to endocrine disorders: Secondary | ICD-10-CM | POA: Diagnosis not present

## 2023-02-22 DIAGNOSIS — E1159 Type 2 diabetes mellitus with other circulatory complications: Secondary | ICD-10-CM | POA: Diagnosis not present

## 2023-02-23 LAB — BASIC METABOLIC PANEL
Calcium: 9.4
EGFR: 42

## 2023-02-24 ENCOUNTER — Ambulatory Visit: Payer: Medicare HMO | Attending: Cardiology | Admitting: Cardiology

## 2023-02-24 ENCOUNTER — Encounter: Payer: Self-pay | Admitting: Cardiology

## 2023-02-24 VITALS — BP 118/66 | HR 53 | Ht 66.0 in | Wt 150.8 lb

## 2023-02-24 DIAGNOSIS — E088 Diabetes mellitus due to underlying condition with unspecified complications: Secondary | ICD-10-CM

## 2023-02-24 DIAGNOSIS — I25709 Atherosclerosis of coronary artery bypass graft(s), unspecified, with unspecified angina pectoris: Secondary | ICD-10-CM

## 2023-02-24 DIAGNOSIS — I83019 Varicose veins of right lower extremity with ulcer of unspecified site: Secondary | ICD-10-CM | POA: Diagnosis not present

## 2023-02-24 DIAGNOSIS — I7121 Aneurysm of the ascending aorta, without rupture: Secondary | ICD-10-CM

## 2023-02-24 DIAGNOSIS — L97919 Non-pressure chronic ulcer of unspecified part of right lower leg with unspecified severity: Secondary | ICD-10-CM | POA: Diagnosis not present

## 2023-02-24 DIAGNOSIS — E785 Hyperlipidemia, unspecified: Secondary | ICD-10-CM

## 2023-02-24 DIAGNOSIS — I10 Essential (primary) hypertension: Secondary | ICD-10-CM

## 2023-02-24 DIAGNOSIS — Z951 Presence of aortocoronary bypass graft: Secondary | ICD-10-CM

## 2023-02-24 DIAGNOSIS — L97818 Non-pressure chronic ulcer of other part of right lower leg with other specified severity: Secondary | ICD-10-CM | POA: Diagnosis not present

## 2023-02-24 MED ORDER — RANOLAZINE ER 500 MG PO TB12: 500.0000 mg | ORAL_TABLET | Freq: Two times a day (BID) | ORAL | 3 refills | Status: AC

## 2023-02-24 NOTE — Patient Instructions (Addendum)
Medication Instructions:  Your physician has recommended you make the following change in your medication:   Decrease your Ranexa (Ranolazine) to 500 mg twice daily  Stop Aspirin  *If you need a refill on your cardiac medications before your next appointment, please call your pharmacy*   Lab Work: None ordered If you have labs (blood work) drawn today and your tests are completely normal, you will receive your results only by: MyChart Message (if you have MyChart) OR A paper copy in the mail If you have any lab test that is abnormal or we need to change your treatment, we will call you to review the results.   Testing/Procedures: Your physician has requested that you have an echocardiogram. Echocardiography is a painless test that uses sound waves to create images of your heart. It provides your doctor with information about the size and shape of your heart and how well your heart's chambers and valves are working. This procedure takes approximately one hour. There are no restrictions for this procedure. Please do NOT wear cologne, perfume, aftershave, or lotions (deodorant is allowed). Please arrive 15 minutes prior to your appointment time.  Non-Cardiac CT scanning, (CAT scanning), is a noninvasive, special x-ray that produces cross-sectional images of the body using x-rays and a computer. CT scans help physicians diagnose and treat medical conditions. For some CT exams, a contrast material is used to enhance visibility in the area of the body being studied. CT scans provide greater clarity and reveal more details than regular x-ray exams.   This will be done at:  [facility] [address] [place],  [place]  [address] Main: [phone]   Follow-Up: At [facility], you and your health needs are our priority.  As part of our continuing mission to provide you with exceptional heart care, we have created designated Provider Care Teams.  These  Care Teams include your primary Cardiologist (physician) and Advanced Practice Providers (APPs -  Physician Assistants and Nurse Practitioners) who all work together to provide you with the care you need, when you need it.  We recommend signing up for the patient portal called "MyChart".  Sign up information is provided on this After Visit Summary.  MyChart is used to connect with patients for Virtual Visits (Telemedicine).  Patients are able to view lab/test results, encounter notes, upcoming appointments, etc.  Non-urgent messages can be sent to your provider as well.   To learn more about what you can do with MyChart, go to [email or web address].    Your next appointment:   9 month(s)  The format for your next appointment:   In Person  Provider:   Belva Crome, MD   Other Instructions Echocardiogram An echocardiogram is a test that uses sound waves (ultrasound) to produce images of the heart. Images from an echocardiogram can provide important information about: Heart size and shape. The size and thickness and movement of your heart's walls. Heart muscle function and strength. Heart valve function or if you have stenosis. Stenosis is when the heart valves are too narrow. If blood is flowing backward through the heart valves (regurgitation). A tumor or infectious growth around the heart valves. Areas of heart muscle that are not working well because of poor blood flow or injury from a heart attack. Aneurysm detection. An aneurysm is a weak or damaged part of an artery wall. The wall bulges out from the normal force of blood pumping through the body. Tell a health care provider about:  Any allergies you have. All medicines you are taking, including vitamins, herbs, eye drops, creams, and over-the-counter medicines. Any blood disorders you have. Any surgeries you have had. Any medical conditions you have. Whether you are pregnant or may be pregnant. What are the  risks? Generally, this is a safe test. However, problems may occur, including an allergic reaction to dye (contrast) that may be used during the test. What happens before the test? No specific preparation is needed. You may eat and drink normally. What happens during the test? You will take off your clothes from the waist up and put on a hospital gown. Electrodes or electrocardiogram (ECG)patches may be placed on your chest. The electrodes or patches are then connected to a device that monitors your heart rate and rhythm. You will lie down on a table for an ultrasound exam. A gel will be applied to your chest to help sound waves pass through your skin. A handheld device, called a transducer, will be pressed against your chest and moved over your heart. The transducer produces sound waves that travel to your heart and bounce back (or "echo" back) to the transducer. These sound waves will be captured in real-time and changed into images of your heart that can be viewed on a video monitor. The images will be recorded on a computer and reviewed by your health care provider. You may be asked to change positions or hold your breath for a short time. This makes it easier to get different views or better views of your heart. In some cases, you may receive contrast through an IV in one of your veins. This can improve the quality of the pictures from your heart. The procedure may vary among health care providers and hospitals.   What can I expect after the test? You may return to your normal, everyday life, including diet, activities, and medicines, unless your health care provider tells you not to do that. Follow these instructions at home: It is up to you to get the results of your test. Ask your health care provider, or the department that is doing the test, when your results will be ready. Keep all follow-up visits. This is important. Summary An echocardiogram is a test that uses sound waves (ultrasound)  to produce images of the heart. Images from an echocardiogram can provide important information about the size and shape of your heart, heart muscle function, heart valve function, and other possible heart problems. You do not need to do anything to prepare before this test. You may eat and drink normally. After the echocardiogram is completed, you may return to your normal, everyday life, unless your health care provider tells you not to do that. This information is not intended to replace advice given to you by your health care provider. Make sure you discuss any questions you have with your health care provider. Document Revised: 08/21/2019 Document Reviewed: 08/21/2019 Elsevier Patient Education  2021 [employer]vier Inc.   Important Information About Sugar

## 2023-02-24 NOTE — Progress Notes (Signed)
Cardiology Office Note:    Date:  02/24/2023   ID:  Mary [name], DOB 1940/07/16, MRN [id number]  PCP:  Charlott Rakes, MD  Cardiologist:  Garwin Brothers, MD   Referring MD: Charlott Rakes, MD    ASSESSMENT:    1. Coronary artery disease involving coronary bypass graft of native heart with angina pectoris (HCC)   2. Aneurysm of ascending aorta without rupture (HCC)   3. Essential (primary) hypertension   4. Diabetes mellitus due to underlying condition with unspecified complications (HCC)   5. H/O coronary artery bypass surgery   6. Dyslipidemia    PLAN:    In order of problems listed above:  Coronary artery disease: Secondary prevention stressed with the patient.  Importance of compliance with diet medication stressed and she vocalized understanding.  She was advised to ambulate to the best of her ability. Ascending aortic aneurysm: We will do a CT scan to assess that she is agreeable. Essential hypertension: Blood pressure stable and diet was emphasized. Mixed dyslipidemia: On lipid-lowering medications followed by primary care. Essential hypertension: Because of borderline blood pressure amlodipine was discontinued by primary care. Renal insufficiency: I discussed this finding with her.  I reduced Ranexa to 500 mg twice daily. Cardiac murmur: Echocardiogram will be done to assess murmur heard on auscultation. Patient will be seen in follow-up appointment in 9 months or earlier if the patient has any concerns.    Medication Adjustments/Labs and Tests Ordered: Current medicines are reviewed at length with the patient today.  Concerns regarding medicines are outlined above.  Orders Placed This Encounter  Procedures   EKG 12-Lead   No orders of the defined types were placed in this encounter.    No chief complaint on file.    History of Present Illness:    Mary [name] is a 83 y.o. female.  Patient has past medical history of coronary artery disease, ascending  aortic aneurysm, dyslipidemia and diabetes mellitus and renal insufficiency.  She denies any problems at this time and takes care of activities of daily living.  No chest pain orthopnea or PND.  She has a wound on her right leg.  She is seeing her medical doctor for this.  At the time of my evaluation, the patient is alert awake oriented and in no distress.  Past Medical History:  Diagnosis Date   Arthritis    Ascending aortic aneurysm (HCC) 11/22/2019   CAD in native artery 09/18/2014   Cellulitis 04/06/2014   Distal right lower extremity    Coronary artery disease    Coronary artery disease involving coronary bypass graft of native heart with angina pectoris (HCC) 07/31/2018   Coronavirus infection 10/10/2018   Formatting of this note might be different from the original. This patient presented to the [facility] on 10/07/2018 where she was tested for COVID-19. The patient's POS COVID-19 test resulted on 10/10/2018. The patient's granddaughter, on HIPAA, was contacted by Alfonzo Beers PA-C for notification on 10/10/2018 at 0935 hr.  Granddaughter reports patient lives at home w   Diabetes mellitus due to underlying condition with unspecified complications (HCC) 04/06/2014   Diabetes mellitus without complication (HCC)    DM II   Dyslipidemia 09/18/2014   Essential (primary) hypertension 09/18/2014   Fever 04/06/2014   H/O coronary artery bypass surgery 09/24/2014   Hyperlipemia    Hyponatremia 04/06/2014   Hypoxia 04/06/2014   Nephrolithiasis 08/22/2021   Primary osteoarthritis of knee 06/02/2015   Primary osteoarthritis of right knee  03/29/2014   Pulmonary hypertension (HCC) 10/05/2021    Past Surgical History:  Procedure Laterality Date   BACK SURGERY  1992   CARDIAC CATHETERIZATION     CARPAL TUNNEL RELEASE Left    COLONOSCOPY     CORONARY ARTERY BYPASS GRAFT  11/10/1994   x 3   EYE SURGERY Bilateral    Glaucoma   TOTAL KNEE ARTHROPLASTY Right  04/01/2014   Procedure: RIGHT TOTAL KNEE ARTHROPLASTY;  Surgeon: Gean Birchwood, MD;  Location: [facility] OR;  Service: Orthopedics;  Laterality: Right;   TOTAL KNEE ARTHROPLASTY Left 06/02/2015   Procedure: LEFT TOTAL KNEE ARTHROPLASTY;  Surgeon: Gean Birchwood, MD;  Location: [facility] OR;  Service: Orthopedics;  Laterality: Left;   TUBAL LIGATION      Current Medications: Current Meds  Medication Sig   aspirin EC 81 MG tablet Take 81 mg by mouth daily.   chlorthalidone (HYGROTON) 25 MG tablet Take 25 mg by mouth daily.   cloNIDine (CATAPRES) 0.2 MG tablet Take 1 tablet by mouth twice daily   clopidogrel (PLAVIX) 75 MG tablet Take 1 tablet (75 mg total) by mouth daily.   fenofibrate (TRICOR) 145 MG tablet Take 1 tablet by mouth once daily   isosorbide mononitrate (IMDUR) 60 MG 24 hr tablet Take 1 tablet (60 mg total) by mouth daily.   Magnesium Oxide 420 MG TABS Take 1 tablet by mouth daily.   metoprolol succinate (TOPROL-XL) 50 MG 24 hr tablet Take 1 tablet (50 mg total) by mouth daily.   nitroGLYCERIN (NITROSTAT) 0.4 MG SL tablet Place 1 tablet (0.4 mg total) under the tongue every 5 (five) minutes as needed for chest pain.   pantoprazole (PROTONIX) 20 MG tablet Take 20 mg by mouth daily.   ranolazine (RANEXA) 1000 MG SR tablet Take 1 tablet (1,000 mg total) by mouth 2 (two) times daily.   rosuvastatin (CRESTOR) 20 MG tablet Take 20 mg by mouth daily.   SYNJARDY XR 25-1000 MG TB24 Take 1 tablet by mouth daily.   valsartan (DIOVAN) 320 MG tablet Take 1 tablet (320 mg total) by mouth daily. 1rst attempt, patient needs and appt for additional refills     Allergies:   Patient has no known allergies.   Social History   Socioeconomic History   Marital status: Single    Spouse name: Not on file   Number of children: Not on file   Years of education: Not on file   Highest education level: Not on file  Occupational History   Not on file  Tobacco Use   Smoking status: Former    Current packs/day: 0.00     Average packs/day: 0.5 packs/day for 5.0 years (2.5 ttl pk-yrs)    Types: Cigarettes    Start date: 04/05/1980    Quit date: 04/05/1985    Years since quitting: 37.9   Smokeless tobacco: Never  Vaping Use   Vaping status: Never Used  Substance and Sexual Activity   Alcohol use: No   Drug use: No   Sexual activity: Never  Other Topics Concern   Not on file  Social History Narrative   Not on file   Social Drivers of Health   Financial Resource Strain: Not on file  Food Insecurity: Not on file  Transportation Needs: Not on file  Physical Activity: Not on file  Stress: Not on file  Social Connections: Not on file     Family History: The patient's family history includes Diabetes in her father; Heart Problems in her mother;  High Cholesterol in her mother; Hypertension in her brother; Stroke in her mother.  ROS:   Please see the history of present illness.    All other systems reviewed and are negative.  EKGs/Labs/Other Studies Reviewed:    The following studies were reviewed today: .Marland KitchenEKG Interpretation Date/Time:  Thursday February 24 2023 10:08:09 EST Ventricular Rate:  53 PR Interval:  190 QRS Duration:  100 QT Interval:  466 QTC Calculation: 437 R Axis:   -21  Text Interpretation: Sinus bradycardia Incomplete right bundle branch block Left ventricular hypertrophy Nonspecific T wave abnormality Abnormal ECG When compared with ECG of 02-Jun-2015 07:46, Incomplete right bundle branch block is now Present Inverted T waves have replaced nonspecific T wave abnormality in Inferior leads Confirmed by Belva Crome [phone]) on 02/24/2023 10:37:50 AM     Recent Labs: No results found for requested labs within last 365 days.  Recent Lipid Panel    Component Value Date/Time   CHOL 176 07/22/2021 0928   TRIG 147 07/22/2021 0928   HDL 55 07/22/2021 0928   CHOLHDL 3.2 07/22/2021 0928   LDLCALC 95 07/22/2021 0928    Physical Exam:    VS:  BP 118/66   Pulse (!) 53   Ht  5\' 6"  (1.676 m)   Wt 150 lb 12.8 oz (68.4 kg)   SpO2 93%   BMI 24.34 kg/m     Wt Readings from Last 3 Encounters:  02/24/23 150 lb 12.8 oz (68.4 kg)  04/05/22 157 lb 12.8 oz (71.6 kg)  09/07/21 156 lb 6.4 oz (70.9 kg)     GEN: Patient is in no acute distress HEENT: Normal NECK: No JVD; No carotid bruits LYMPHATICS: No lymphadenopathy CARDIAC: Hear sounds regular, 2/6 systolic murmur at the apex. RESPIRATORY:  Clear to auscultation without rales, wheezing or rhonchi  ABDOMEN: Soft, non-tender, non-distended MUSCULOSKELETAL:  No edema; No deformity  SKIN: Warm and dry NEUROLOGIC:  Alert and oriented x 3 PSYCHIATRIC:  Normal affect   Signed, Garwin Brothers, MD  02/24/2023 10:50 AM    [facility]

## 2023-02-25 ENCOUNTER — Ambulatory Visit (HOSPITAL_BASED_OUTPATIENT_CLINIC_OR_DEPARTMENT_OTHER)
Admission: RE | Admit: 2023-02-25 | Discharge: 2023-02-25 | Disposition: A | Discharge status: Home / Self Care | Payer: Medicare HMO | Source: Ambulatory Visit | Attending: Cardiology | Admitting: Cardiology

## 2023-02-25 DIAGNOSIS — I700 Atherosclerosis of aorta: Secondary | ICD-10-CM | POA: Diagnosis not present

## 2023-02-25 DIAGNOSIS — I7121 Aneurysm of the ascending aorta, without rupture: Secondary | ICD-10-CM

## 2023-02-25 DIAGNOSIS — J432 Centrilobular emphysema: Secondary | ICD-10-CM | POA: Diagnosis not present

## 2023-03-02 ENCOUNTER — Other Ambulatory Visit: Payer: Self-pay | Admitting: Cardiology

## 2023-03-03 DIAGNOSIS — I83019 Varicose veins of right lower extremity with ulcer of unspecified site: Secondary | ICD-10-CM | POA: Diagnosis not present

## 2023-03-03 DIAGNOSIS — L97818 Non-pressure chronic ulcer of other part of right lower leg with other specified severity: Secondary | ICD-10-CM | POA: Diagnosis not present

## 2023-03-04 DIAGNOSIS — L97919 Non-pressure chronic ulcer of unspecified part of right lower leg with unspecified severity: Secondary | ICD-10-CM | POA: Diagnosis not present

## 2023-03-08 DIAGNOSIS — L97818 Non-pressure chronic ulcer of other part of right lower leg with other specified severity: Secondary | ICD-10-CM | POA: Diagnosis not present

## 2023-03-08 DIAGNOSIS — I83019 Varicose veins of right lower extremity with ulcer of unspecified site: Secondary | ICD-10-CM | POA: Diagnosis not present

## 2023-03-08 DIAGNOSIS — Z6825 Body mass index (BMI) 25.0-25.9, adult: Secondary | ICD-10-CM | POA: Diagnosis not present

## 2023-03-08 DIAGNOSIS — I2510 Atherosclerotic heart disease of native coronary artery without angina pectoris: Secondary | ICD-10-CM | POA: Diagnosis not present

## 2023-03-08 DIAGNOSIS — L97919 Non-pressure chronic ulcer of unspecified part of right lower leg with unspecified severity: Secondary | ICD-10-CM | POA: Diagnosis not present

## 2023-03-08 DIAGNOSIS — N1832 Chronic kidney disease, stage 3b: Secondary | ICD-10-CM | POA: Diagnosis not present

## 2023-03-09 ENCOUNTER — Telehealth: Payer: Self-pay | Admitting: Cardiology

## 2023-03-09 NOTE — Telephone Encounter (Signed)
 Recommendations reviewed with [facility] per DPR as per Dr. Kem Parkinson note. Mary [name] verbalized understanding and had no additional questions.

## 2023-03-09 NOTE — Telephone Encounter (Signed)
 Spoke with pts son [place] per [place]. Pt was on the phone as well. She was telling her son that she is having more chest pain since Dr. Tomie China stopped some of her medications. She has had to take Nitroglycerin 2 different times. She denied any shortness of breath, swelling in her feet and legs or other pain. She kept saying one doctor tells her that her BP is up and the other doctor tells her it is down. She takes her blood pressures at home but had no readings to share. Please advise.

## 2023-03-09 NOTE — Telephone Encounter (Signed)
 Pt c/o of Chest Pain: STAT if CP now or developed within 24 hours  1. Are you having CP right now? no  2. Are you experiencing any other symptoms (ex. SOB, nausea, vomiting, sweating)? Some sob  3. How long have you been experiencing CP? Couple days  4. Is your CP continuous or coming and going? Coming and going   5. Have you taken Nitroglycerin? Yes   Pt c/o medication issue:  1. Name of Medication: unsure which one   2. How are you currently taking this medication (dosage and times per day)?   3. Are you having a reaction (difficulty breathing--STAT)? no  4. What is your medication issue? Patient thinks this is happening because of the medication Dr. Tomie China took her off of. Patient would like to be back on that medication.  ?

## 2023-03-10 ENCOUNTER — Ambulatory Visit: Payer: Medicare HMO | Admitting: Cardiology

## 2023-03-11 ENCOUNTER — Telehealth: Payer: Self-pay | Admitting: Cardiology

## 2023-03-11 NOTE — Telephone Encounter (Signed)
 The patient came to the office with her daughter and the daughter stated that her blood pressures are up one day and then down the next day. She had been using a wrist cuff to take her blood pressure. When we compared our blood pressures that we got to the one that she was getting with her wrist blood pressure cuff there was a great discrepancy. Spoke to Wallis Bamberg, NP and she recommended that the patient purchase a new blood pressure cuff that goes around her arm and start using the upper arm blood pressure cuff to check her blood pressure every day for the next 2 weeks. Then after 2 weeks drop off the list of blood pressures to the office. [place] Brady CMA reviewed Jennifer's recommendations with the patient and her daughter. The patient and daughter had no further questions at this time.

## 2023-03-11 NOTE — Telephone Encounter (Signed)
 Patient is in the waiting room stating that her BP is low in the morning and high in the afternoon and she's concerned about it, would like to speak to a nurse. CB # [phone]

## 2023-03-13 ENCOUNTER — Other Ambulatory Visit: Payer: Self-pay | Admitting: Cardiology

## 2023-03-13 DIAGNOSIS — I10 Essential (primary) hypertension: Secondary | ICD-10-CM

## 2023-03-15 ENCOUNTER — Ambulatory Visit: Payer: Medicare HMO | Attending: Cardiology

## 2023-03-15 DIAGNOSIS — I25709 Atherosclerosis of coronary artery bypass graft(s), unspecified, with unspecified angina pectoris: Secondary | ICD-10-CM | POA: Diagnosis not present

## 2023-03-15 DIAGNOSIS — L97919 Non-pressure chronic ulcer of unspecified part of right lower leg with unspecified severity: Secondary | ICD-10-CM | POA: Diagnosis not present

## 2023-03-16 LAB — ECHOCARDIOGRAM COMPLETE
Area-P 1/2: 2.46 cm2
MV M vel: 2.72 m/s
MV Peak grad: 29.6 mmHg
S' Lateral: 3.8 cm

## 2023-03-18 ENCOUNTER — Other Ambulatory Visit: Payer: Self-pay | Admitting: Cardiology

## 2023-03-20 ENCOUNTER — Encounter (HOSPITAL_BASED_OUTPATIENT_CLINIC_OR_DEPARTMENT_OTHER): Payer: Self-pay | Admitting: Emergency Medicine

## 2023-03-20 ENCOUNTER — Ambulatory Visit (HOSPITAL_BASED_OUTPATIENT_CLINIC_OR_DEPARTMENT_OTHER): Admission: EM | Admit: 2023-03-20 | Discharge: 2023-03-20 | Disposition: A | Discharge status: Home / Self Care

## 2023-03-20 DIAGNOSIS — I10 Essential (primary) hypertension: Secondary | ICD-10-CM | POA: Diagnosis not present

## 2023-03-20 DIAGNOSIS — S81801A Unspecified open wound, right lower leg, initial encounter: Secondary | ICD-10-CM

## 2023-03-20 MED ORDER — AMLODIPINE BESYLATE 2.5 MG PO TABS: 2.5000 mg | ORAL_TABLET | Freq: Every day | ORAL | 0 refills | Status: DC

## 2023-03-20 NOTE — ED Provider Notes (Signed)
 [facility]    CSN: [id number] Arrival date & time: 03/20/23  0857      History   Chief Complaint No chief complaint on file.   HPI Mary [name] is a 83 y.o. female.   Patient is here with her daughter.  The patient does not speak [place].  Offered translation services but patient preferred for her daughter to translate.  Patient has hypertension and heart disease.  She saw her cardiologist on Friday, 03/18/2023.  She is on valsartan and that was continued.  Her amlodipine was stopped.  She has had higher blood pressures over the weekend.  She took amlodipine before she went to sleep last night.  She had taken valsartan yesterday morning.  During last night of around 4 or 5 AM she took another amlodipine 10 mg.  She did not take valsartan this morning because she was not sure what was going on and did not want to take any more medication.  She is supposed to be monitoring her blood pressure and will see cardiology again in a few weeks.  She has a dressing on her right lower leg.  Her daughter showed me a picture of an approximately 2 cm round erosive wound.  Her [place] a cut on her leg in her garden and it has made a persistent wound.  She is seen weekly by her family doctor for wound care and this area is getting better.     Past Medical History:  Diagnosis Date   Arthritis    Ascending aortic aneurysm (HCC) 11/22/2019   CAD in native artery 09/18/2014   Cellulitis 04/06/2014   Distal right lower extremity    Coronary artery disease    Coronary artery disease involving coronary bypass graft of native heart with angina pectoris (HCC) 07/31/2018   Coronavirus infection 10/10/2018   Formatting of this note might be different from the original. This patient presented to the [facility] on 10/07/2018 where she was tested for COVID-19. The patient's POS COVID-19 test resulted on 10/10/2018. The patient's  granddaughter, on HIPAA, was contacted by Alfonzo Beers PA-C for notification on 10/10/2018 at 0935 hr.  Granddaughter reports patient lives at home w   Diabetes mellitus due to underlying condition with unspecified complications (HCC) 04/06/2014   Diabetes mellitus without complication (HCC)    DM II   Dyslipidemia 09/18/2014   Essential (primary) hypertension 09/18/2014   Fever 04/06/2014   H/O coronary artery bypass surgery 09/24/2014   Hyperlipemia    Hyponatremia 04/06/2014   Hypoxia 04/06/2014   Nephrolithiasis 08/22/2021   Primary osteoarthritis of knee 06/02/2015   Primary osteoarthritis of right knee 03/29/2014   Pulmonary hypertension (HCC) 10/05/2021    Patient Active Problem List   Diagnosis Date Noted   Pulmonary hypertension (HCC) 10/05/2021   Nephrolithiasis 08/22/2021   Coronary artery disease    Ascending aortic aneurysm (HCC) 11/22/2019   Arthritis    Diabetes mellitus without complication (HCC)    Hyperlipemia    Coronavirus infection 10/10/2018   Coronary artery disease involving coronary bypass graft of native heart with angina pectoris (HCC) 07/31/2018   Primary osteoarthritis of knee 06/02/2015   H/O coronary artery bypass surgery 09/24/2014   Dyslipidemia 09/18/2014   Essential (primary) hypertension 09/18/2014   CAD in native artery 09/18/2014   Hypoxia 04/06/2014   Cellulitis 04/06/2014   Diabetes mellitus due to underlying condition with unspecified complications (HCC) 04/06/2014  Fever 04/06/2014   Primary osteoarthritis of right knee 03/29/2014    Past Surgical History:  Procedure Laterality Date   BACK SURGERY  1992   CARDIAC CATHETERIZATION     CARPAL TUNNEL RELEASE Left    COLONOSCOPY     CORONARY ARTERY BYPASS GRAFT  11/10/1994   x 3   EYE SURGERY Bilateral    Glaucoma   TOTAL KNEE ARTHROPLASTY Right 04/01/2014   Procedure: RIGHT TOTAL KNEE ARTHROPLASTY;  Surgeon: Gean Birchwood, MD;  Location: [facility];  Service: Orthopedics;   Laterality: Right;   TOTAL KNEE ARTHROPLASTY Left 06/02/2015   Procedure: LEFT TOTAL KNEE ARTHROPLASTY;  Surgeon: Gean Birchwood, MD;  Location: [facility] OR;  Service: Orthopedics;  Laterality: Left;   TUBAL LIGATION      OB History   No obstetric history on file.      Home Medications    Prior to Admission medications   Medication Sig Start Date End Date Taking? Authorizing Provider  amLODipine (NORVASC) 2.5 MG tablet Take 1 tablet (2.5 mg total) by mouth daily. 03/20/23  Yes Prescilla Sours, FNP  clopidogrel (PLAVIX) 75 MG tablet Take 1 tablet by mouth once daily 03/02/23  Yes Georgeanna Lea, MD  fenofibrate (TRICOR) 145 MG tablet Take 1 tablet by mouth once daily 07/13/22  Yes Revankar, Aundra Dubin, MD  isosorbide mononitrate (IMDUR) 60 MG 24 hr tablet Take 1 tablet (60 mg total) by mouth daily. 03/22/22  Yes Revankar, Aundra Dubin, MD  metoprolol succinate (TOPROL-XL) 50 MG 24 hr tablet Take 1 tablet (50 mg total) by mouth daily. 03/14/23  Yes Revankar, Aundra Dubin, MD  ranolazine (RANEXA) 1000 MG SR tablet Take 1,000 mg by mouth 2 (two) times daily. 03/18/23  Yes [provider]  ranolazine (RANEXA) 500 MG 12 hr tablet Take 1 tablet (500 mg total) by mouth 2 (two) times daily. 02/24/23   Revankar, Aundra Dubin, MD  SYNJARDY XR 25-1000 MG TB24 Take 1 tablet by mouth daily. 04/28/21  Yes [provider]  traMADol (ULTRAM) 50 MG tablet Take 50 mg by mouth 3 (three) times daily as needed. 03/08/23  Yes [provider]  valsartan (DIOVAN) 320 MG tablet Take 1 tablet (320 mg total) by mouth daily. 1rst attempt, patient needs and appt for additional refills 02/18/23  Yes Revankar, Aundra Dubin, MD  chlorthalidone (HYGROTON) 25 MG tablet Take 1 tablet (25 mg total) by mouth daily. 03/18/23   Revankar, Aundra Dubin, MD  cloNIDine (CATAPRES) 0.2 MG tablet Take 1 tablet by mouth twice daily 10/14/22   Revankar, Aundra Dubin, MD  Magnesium Oxide 420 MG TABS Take 1 tablet by mouth daily.    [provider]   nitroGLYCERIN (NITROSTAT) 0.4 MG SL tablet Place 1 tablet (0.4 mg total) under the tongue every 5 (five) minutes as needed for chest pain. 03/14/23   Revankar, Aundra Dubin, MD  pantoprazole (PROTONIX) 20 MG tablet Take 20 mg by mouth daily.    [provider]  rosuvastatin (CRESTOR) 20 MG tablet Take 20 mg by mouth daily. 12/07/22   [provider]    Family History Family History  Problem Relation Age of Onset   High Cholesterol Mother    Heart Problems Mother    Stroke Mother    Diabetes Father    Hypertension Brother     Social History Social History   Tobacco Use   Smoking status: Former    Current packs/day: 0.00    Average packs/day: 0.5 packs/day for 5.0 years (  2.5 ttl pk-yrs)    Types: Cigarettes    Start date: 04/05/1980    Quit date: 04/05/1985    Years since quitting: 37.9   Smokeless tobacco: Never  Vaping Use   Vaping status: Never Used  Substance Use Topics   Alcohol use: No   Drug use: No     Allergies   Patient has no known allergies.   Review of Systems Review of Systems  Constitutional:  Negative for chills and fever.  HENT:  Negative for ear pain and sore throat.   Eyes:  Negative for pain and visual disturbance.  Respiratory:  Negative for cough and shortness of breath.   Cardiovascular:  Negative for chest pain and palpitations.  Gastrointestinal:  Negative for abdominal pain, constipation, diarrhea, nausea and vomiting.  Genitourinary:  Negative for dysuria and hematuria.  Musculoskeletal:  Negative for arthralgias and back pain.  Skin:  Positive for wound (right lower leg). Negative for color change and rash.  Neurological:  Negative for seizures and syncope.  All other systems reviewed and are negative.    Physical Exam Triage Vital Signs ED Triage Vitals  Encounter Vitals Group     BP 03/20/23 0912 (!) 161/79     Systolic BP Percentile --      Diastolic BP Percentile --      Pulse Rate 03/20/23 0912 65     Resp  03/20/23 0912 20     Temp 03/20/23 0912 97.7 F (36.5 C)     Temp Source 03/20/23 0912 Oral     SpO2 03/20/23 0912 95 %     Weight --      Height --      Head Circumference --      Peak Flow --      Pain Score 03/20/23 0906 0     Pain Loc --      Pain Education --      Exclude from Growth Chart --    No data found.  Updated Vital Signs BP (!) 161/79 (BP Location: Right Arm)   Pulse 65   Temp 97.7 F (36.5 C) (Oral)   Resp 20   SpO2 95%   Visual Acuity Right Eye Distance:   Left Eye Distance:   Bilateral Distance:    Right Eye Near:   Left Eye Near:    Bilateral Near:     Physical Exam Vitals and nursing note reviewed.  Constitutional:      General: She is not in acute distress.    Appearance: She is well-developed. She is not ill-appearing or toxic-appearing.  HENT:     Head: Normocephalic and atraumatic.     Right Ear: Hearing, tympanic membrane, ear canal and external ear normal.     Left Ear: Hearing, tympanic membrane, ear canal and external ear normal.     Nose: No congestion or rhinorrhea.     Right Sinus: No maxillary sinus tenderness or frontal sinus tenderness.     Left Sinus: No maxillary sinus tenderness or frontal sinus tenderness.     Mouth/Throat:     Lips: Pink.     Mouth: Mucous membranes are moist.     Pharynx: Uvula midline. No oropharyngeal exudate or posterior oropharyngeal erythema.     Tonsils: No tonsillar exudate.  Eyes:     Conjunctiva/sclera: Conjunctivae normal.     Pupils: Pupils are equal, round, and reactive to light.  Cardiovascular:     Rate and Rhythm: Normal rate and regular rhythm.  Heart sounds: S1 normal and S2 normal. No murmur heard. Pulmonary:     Effort: Pulmonary effort is normal. No respiratory distress.     Breath sounds: Normal breath sounds. No decreased breath sounds, wheezing, rhonchi or rales.  Abdominal:     General: Bowel sounds are normal.     Palpations: Abdomen is soft.     Tenderness: There is no  abdominal tenderness.  Musculoskeletal:        General: No swelling.     Cervical back: Neck supple.  Lymphadenopathy:     Head:     Right side of head: No submental, submandibular, tonsillar, preauricular or posterior auricular adenopathy.     Left side of head: No submental, submandibular, tonsillar, preauricular or posterior auricular adenopathy.     Cervical: No cervical adenopathy.     Right cervical: No superficial cervical adenopathy.    Left cervical: No superficial cervical adenopathy.  Skin:    General: Skin is warm and dry.     Capillary Refill: Capillary refill takes less than 2 seconds.     Findings: No rash.     Comments: Dressing on anterior right lower leg, above the ankle.  Patient did not want me to remove the dressing to look at the wound but her daughter had a picture that was recent of the wound.  It is a oval-shaped approximately 2 cm x 1.5 cm circular erosion that appears to be granulating well without erythema, exudate, edema.  Neurological:     Mental Status: She is alert and oriented to person, place, and time.  Psychiatric:        Mood and Affect: Mood normal.      UC Treatments / Results  Labs (all labs ordered are listed, but only abnormal results are displayed) Labs Reviewed - No data to display Comprehensive Metabolic Panel: 10/05/21:  Order: [id number] Component Ref Range & Units 1 yr ago  Sodium 135 - 146 MMOL/L 138  Potassium 3.5 - 5.3 MMOL/L 4.5  Comment: NO VISIBLE HEMOLYSIS  Chloride 98 - 110 MMOL/L 104  CO2 21 - 31 MMOL/L 26  BUN 8 - 24 MG/DL 25 High   Glucose 70 - 99 MG/DL 409 High   Comment: Patients taking eltrombopag at doses >/= 100 mg daily may show falsely elevated values of 10% or greater.  Creatinine 0.60 - 1.20 MG/DL 8.11  Calcium 8.5 - 91.4 MG/DL 9.0  Total Protein 6.4 - 8.9 G/DL 6.6  Comment: Patients taking eltrombopag at doses >/= 100 mg daily may show falsely elevated values of 10% or greater.  Albumin 3.5 - 5.7  G/DL 3.9  Total Bilirubin 0.0 - 1.0 MG/DL 0.3  Comment: Patients taking eltrombopag at doses >/= 100 mg daily may show falsely elevated values of 10% or greater.  Alkaline Phosphatase 34 - 104 IU/L or U/L 49  AST (SGOT) 13 - 39 IU/L or U/L 10 Low   ALT (SGPT) 7 - 52 IU/L or U/L 9  Anion Gap 4 - 14 MMOL/L 8  Est. GFR >=60 ML/MIN/1.73 M*2 47 Low     EKG   Radiology No results found.  Procedures Procedures (including critical care time)  Medications Ordered in UC Medications - No data to display  Initial Impression / Assessment and Plan / UC Course  I have reviewed the triage vital signs and the nursing notes.  Pertinent labs & imaging results that were available during my care of the patient were reviewed by me and considered in my  medical decision making (see chart for details).     The patient is on valsartan, metoprolol, chlorthalidone, clonidine.  Continue those medications as previously prescribed.  Stop amlodipine 10 mg daily as it was previously stopped last week by cardiology.  Try amlodipine 2.5 mg daily for hypertension.  Provided education about hypertension and monitoring her blood pressure.  Monitor blood pressure and follow-up with cardiology regarding blood pressure medications her blood pressures.  Follow-up here if needed. Final Clinical Impressions(s) / UC Diagnoses   Final diagnoses:  Accelerated hypertension  Wound of right lower extremity, initial encounter     Discharge Instructions      Continue valsartan, 320 mg daily.  Continue metoprolol 50 mg daily.  Continue chlorthalidone, 25 mg daily.  Continue clonidine, 0.2 mg twice daily.  Switched to Amlodipine, 2.5 mg daily.  Stop using the Amlodipine 10mg  pills, as it was discontinued by cardiologist last week.  Encouraged rest and relaxation.  Provided information about monitoring blood pressure.  Follow-up with cardiologist or family doctor about blood pressure in the next week if blood pressure does  not improve.  Google Translation - Traduccin de Google: Contine con valsartn, 320 mg al [employer]. Contine con metoprolol 50 mg al da. Contine con la clortalidona, 25 mg al da. Contine con clonidina, 0,2 mg dos veces al [employer]. Cambi a amlodipino, 2,5 mg al [employer]. Deje de usar las pldoras de Amlodipino de 10 mg, ya que el cardilogo lo suspendi la semana pasada. Fomenta el descanso y Facilities manager. Proporcion informacin sobre el control de la presin arterial. Seguimiento con el cardilogo o el mdico de familia sobre la presin arterial en la prxima semana si la presin arterial no mejora.      ED Prescriptions     Medication Sig Dispense Auth. Provider   amLODipine (NORVASC) 2.5 MG tablet Take 1 tablet (2.5 mg total) by mouth daily. 30 tablet Prescilla Sours, FNP      PDMP not reviewed this encounter.   Prescilla Sours, FNP 03/20/23 1004

## 2023-03-20 NOTE — ED Triage Notes (Signed)
 Pt daughter reports she was taken off Amlodipine due to kidney issue and was told to monitor her bp reading and write them down, pt did take a dose of the medication yesterday and this morning due to bp being high.

## 2023-03-20 NOTE — Discharge Instructions (Addendum)
 Continue valsartan, 320 mg daily.  Continue metoprolol 50 mg daily.  Continue chlorthalidone, 25 mg daily.  Continue clonidine, 0.2 mg twice daily.  Switched to Amlodipine, 2.5 mg daily.  Stop using the Amlodipine 10mg  pills, as it was discontinued by cardiologist last week.  Encouraged rest and relaxation.  Provided information about monitoring blood pressure.  Follow-up with cardiologist or family doctor about blood pressure in the next week if blood pressure does not improve.  Google Translation - Traduccin de Google: Contine con valsartn, 320 mg al [employer]. Contine con metoprolol 50 mg al da. Contine con la clortalidona, 25 mg al da. Contine con clonidina, 0,2 mg dos veces al [employer]. Cambi a amlodipino, 2,5 mg al [employer]. Deje de usar las pldoras de Amlodipino de 10 mg, ya que el cardilogo lo suspendi la semana pasada. Fomenta el descanso y Facilities manager. Proporcion informacin sobre el control de la presin arterial. Seguimiento con el cardilogo o el mdico de familia sobre la presin arterial en la prxima semana si la presin arterial no mejora.

## 2023-03-21 ENCOUNTER — Ambulatory Visit: Attending: Cardiology | Admitting: Cardiology

## 2023-03-21 ENCOUNTER — Encounter: Payer: Self-pay | Admitting: Cardiology

## 2023-03-21 VITALS — BP 120/68 | HR 66 | Ht 66.0 in | Wt 155.0 lb

## 2023-03-21 DIAGNOSIS — I7121 Aneurysm of the ascending aorta, without rupture: Secondary | ICD-10-CM

## 2023-03-21 DIAGNOSIS — E088 Diabetes mellitus due to underlying condition with unspecified complications: Secondary | ICD-10-CM

## 2023-03-21 DIAGNOSIS — I25709 Atherosclerosis of coronary artery bypass graft(s), unspecified, with unspecified angina pectoris: Secondary | ICD-10-CM

## 2023-03-21 DIAGNOSIS — I10 Essential (primary) hypertension: Secondary | ICD-10-CM

## 2023-03-21 DIAGNOSIS — E785 Hyperlipidemia, unspecified: Secondary | ICD-10-CM | POA: Diagnosis not present

## 2023-03-21 NOTE — Patient Instructions (Signed)

## 2023-03-21 NOTE — Progress Notes (Signed)
 Cardiology Office Note:    Date:  03/21/2023   ID:  Mary [name], DOB 25-Oct-1940, MRN [id number]  PCP:  Charlott Rakes, MD  Cardiologist:  Garwin Brothers, MD   Referring MD: Charlott Rakes, MD    ASSESSMENT:    1. Aneurysm of ascending aorta without rupture (HCC)   2. Coronary artery disease involving coronary bypass graft of native heart with angina pectoris (HCC)   3. Essential (primary) hypertension   4. Diabetes mellitus due to underlying condition with unspecified complications (HCC)   5. Dyslipidemia    PLAN:    In order of problems listed above:  Coronary artery disease: Secondary prevention stressed with the patient.  Importance of compliance with diet medication stressed and she vocalized understanding.  She was advised to walk at least half an hour a day on a daily basis. Essential hypertension: Blood pressure stable.  She will check her blood pressures 3 times a day and will send [place] that report week.  I will titrate medications appropriately. Mixed primary: On lipid-lowering medications followed by primary care. Diabetes mellitus: Patient tells me that she is compliant with diet. Emergency room records were reviewed and questions were answered to her satisfaction.Patient will be seen in follow-up appointment in 6 months or earlier if the patient has any concerns.    Medication Adjustments/Labs and Tests Ordered: Current medicines are reviewed at length with the patient today.  Concerns regarding medicines are outlined above.  No orders of the defined types were placed in this encounter.  No orders of the defined types were placed in this encounter.    No chief complaint on file.    History of Present Illness:    Mary [name] is a 83 y.o. female.  Patient has past medical history of ascending aortic aneurysm, coronary artery disease, essential hypertension, mixed dyslipidemia and diabetes mellitus.  She denies any problems at this time and takes care of  activities of daily living.  Her blood pressure was fluctuating so she went to the emergency room yesterday and her medications were tweaked and I reviewed those records.  Today she is feeling much better.  Her blood pressure is stable and she is happy about it.  At the time of my evaluation, the patient is alert awake oriented and in no distress.  Past Medical History:  Diagnosis Date   Arthritis    Ascending aortic aneurysm (HCC) 11/22/2019   CAD in native artery 09/18/2014   Cellulitis 04/06/2014   Distal right lower extremity    Coronary artery disease    Coronary artery disease involving coronary bypass graft of native heart with angina pectoris (HCC) 07/31/2018   Coronavirus infection 10/10/2018   Formatting of this note might be different from the original. This patient presented to the [facility] on 10/07/2018 where she was tested for COVID-19. The patient's POS COVID-19 test resulted on 10/10/2018. The patient's granddaughter, on HIPAA, was contacted by Alfonzo Beers PA-C for notification on 10/10/2018 at 0935 hr.  Granddaughter reports patient lives at home w   Diabetes mellitus due to underlying condition with unspecified complications (HCC) 04/06/2014   Diabetes mellitus without complication (HCC)    DM II   Dyslipidemia 09/18/2014   Essential (primary) hypertension 09/18/2014   Fever 04/06/2014   H/O coronary artery bypass surgery 09/24/2014   Hyperlipemia    Hyponatremia 04/06/2014   Hypoxia 04/06/2014   Nephrolithiasis 08/22/2021   Primary osteoarthritis of knee 06/02/2015   Primary osteoarthritis of right knee  03/29/2014   Pulmonary hypertension (HCC) 10/05/2021    Past Surgical History:  Procedure Laterality Date   BACK SURGERY  1992   CARDIAC CATHETERIZATION     CARPAL TUNNEL RELEASE Left    COLONOSCOPY     CORONARY ARTERY BYPASS GRAFT  11/10/1994   x 3   EYE SURGERY Bilateral    Glaucoma   TOTAL KNEE ARTHROPLASTY Right 04/01/2014    Procedure: RIGHT TOTAL KNEE ARTHROPLASTY;  Surgeon: Gean Birchwood, MD;  Location: [facility] OR;  Service: Orthopedics;  Laterality: Right;   TOTAL KNEE ARTHROPLASTY Left 06/02/2015   Procedure: LEFT TOTAL KNEE ARTHROPLASTY;  Surgeon: Gean Birchwood, MD;  Location: [facility];  Service: Orthopedics;  Laterality: Left;   TUBAL LIGATION      Current Medications: Current Meds  Medication Sig   amLODipine (NORVASC) 2.5 MG tablet Take 1 tablet (2.5 mg total) by mouth daily.   chlorthalidone (HYGROTON) 25 MG tablet Take 1 tablet (25 mg total) by mouth daily.   cloNIDine (CATAPRES) 0.2 MG tablet Take 1 tablet by mouth twice daily   clopidogrel (PLAVIX) 75 MG tablet Take 1 tablet by mouth once daily   fenofibrate (TRICOR) 145 MG tablet Take 1 tablet by mouth once daily   isosorbide mononitrate (IMDUR) 60 MG 24 hr tablet Take 1 tablet (60 mg total) by mouth daily.   Magnesium Oxide 420 MG TABS Take 1 tablet by mouth daily.   metoprolol succinate (TOPROL-XL) 50 MG 24 hr tablet Take 1 tablet (50 mg total) by mouth daily.   nitroGLYCERIN (NITROSTAT) 0.4 MG SL tablet Place 1 tablet (0.4 mg total) under the tongue every 5 (five) minutes as needed for chest pain.   pantoprazole (PROTONIX) 20 MG tablet Take 20 mg by mouth daily.   ranolazine (RANEXA) 500 MG 12 hr tablet Take 1 tablet (500 mg total) by mouth 2 (two) times daily.   rosuvastatin (CRESTOR) 20 MG tablet Take 20 mg by mouth daily.   SYNJARDY XR 25-1000 MG TB24 Take 1 tablet by mouth daily.   traMADol (ULTRAM) 50 MG tablet Take 50 mg by mouth 3 (three) times daily as needed.   valsartan (DIOVAN) 320 MG tablet Take 1 tablet (320 mg total) by mouth daily. 1rst attempt, patient needs and appt for additional refills     Allergies:   Patient has no known allergies.   Social History   Socioeconomic History   Marital status: Single    Spouse name: Not on file   Number of children: Not on file   Years of education: Not on file   Highest education level: Not on  file  Occupational History   Not on file  Tobacco Use   Smoking status: Former    Current packs/day: 0.00    Average packs/day: 0.5 packs/day for 5.0 years (2.5 ttl pk-yrs)    Types: Cigarettes    Start date: 04/05/1980    Quit date: 04/05/1985    Years since quitting: 37.9   Smokeless tobacco: Never  Vaping Use   Vaping status: Never Used  Substance and Sexual Activity   Alcohol use: No   Drug use: No   Sexual activity: Never  Other Topics Concern   Not on file  Social History Narrative   Not on file   Social Drivers of Health   Financial Resource Strain: Not on file  Food Insecurity: Not on file  Transportation Needs: Not on file  Physical Activity: Not on file  Stress: Not on file  Social Connections:  Not on file     Family History: The patient's family history includes Diabetes in her father; Heart Problems in her mother; High Cholesterol in her mother; Hypertension in her brother; Stroke in her mother.  ROS:   Please see the history of present illness.    All other systems reviewed and are negative.  EKGs/Labs/Other Studies Reviewed:    The following studies were reviewed today: I discussed my findings with the patient at length.primary    Recent Labs: No results found for requested labs within last 365 days.  Recent Lipid Panel    Component Value Date/Time   CHOL 176 07/22/2021 0928   TRIG 147 07/22/2021 0928   HDL 55 07/22/2021 0928   CHOLHDL 3.2 07/22/2021 0928   LDLCALC 95 07/22/2021 0928    Physical Exam:    VS:  BP 120/68   Pulse 66   Ht 5\' 6"  (1.676 m)   Wt 155 lb (70.3 kg)   SpO2 93%   BMI 25.02 kg/m     Wt Readings from Last 3 Encounters:  03/21/23 155 lb (70.3 kg)  02/24/23 150 lb 12.8 oz (68.4 kg)  04/05/22 157 lb 12.8 oz (71.6 kg)     GEN: Patient is in no acute distress HEENT: Normal NECK: No JVD; No carotid bruits LYMPHATICS: No lymphadenopathy CARDIAC: Hear sounds regular, 2/6 systolic murmur at the apex. RESPIRATORY:   Clear to auscultation without rales, wheezing or rhonchi  ABDOMEN: Soft, non-tender, non-distended MUSCULOSKELETAL:  No edema; No deformity  SKIN: Warm and dry NEUROLOGIC:  Alert and oriented x 3 PSYCHIATRIC:  Normal affect   Signed, Garwin Brothers, MD  03/21/2023 2:20 PM    [facility]

## 2023-03-22 DIAGNOSIS — L97919 Non-pressure chronic ulcer of unspecified part of right lower leg with unspecified severity: Secondary | ICD-10-CM | POA: Diagnosis not present

## 2023-03-29 DIAGNOSIS — L97919 Non-pressure chronic ulcer of unspecified part of right lower leg with unspecified severity: Secondary | ICD-10-CM | POA: Diagnosis not present

## 2023-04-05 DIAGNOSIS — L97919 Non-pressure chronic ulcer of unspecified part of right lower leg with unspecified severity: Secondary | ICD-10-CM | POA: Diagnosis not present

## 2023-04-14 ENCOUNTER — Other Ambulatory Visit: Payer: Self-pay | Admitting: Cardiology

## 2023-04-14 DIAGNOSIS — E1159 Type 2 diabetes mellitus with other circulatory complications: Secondary | ICD-10-CM | POA: Diagnosis not present

## 2023-04-14 DIAGNOSIS — E1129 Type 2 diabetes mellitus with other diabetic kidney complication: Secondary | ICD-10-CM | POA: Diagnosis not present

## 2023-04-14 DIAGNOSIS — I152 Hypertension secondary to endocrine disorders: Secondary | ICD-10-CM | POA: Diagnosis not present

## 2023-04-14 DIAGNOSIS — Z6825 Body mass index (BMI) 25.0-25.9, adult: Secondary | ICD-10-CM | POA: Diagnosis not present

## 2023-04-19 DIAGNOSIS — I83019 Varicose veins of right lower extremity with ulcer of unspecified site: Secondary | ICD-10-CM | POA: Diagnosis not present

## 2023-04-19 DIAGNOSIS — L97919 Non-pressure chronic ulcer of unspecified part of right lower leg with unspecified severity: Secondary | ICD-10-CM | POA: Diagnosis not present

## 2023-04-26 ENCOUNTER — Other Ambulatory Visit: Payer: Self-pay | Admitting: Cardiology

## 2023-05-03 DIAGNOSIS — I83019 Varicose veins of right lower extremity with ulcer of unspecified site: Secondary | ICD-10-CM | POA: Diagnosis not present

## 2023-05-03 DIAGNOSIS — L97919 Non-pressure chronic ulcer of unspecified part of right lower leg with unspecified severity: Secondary | ICD-10-CM | POA: Diagnosis not present

## 2023-05-05 ENCOUNTER — Other Ambulatory Visit: Payer: Self-pay

## 2023-05-05 MED ORDER — VALSARTAN 320 MG PO TABS: 320.0000 mg | ORAL_TABLET | Freq: Every day | ORAL | 3 refills | Status: AC

## 2023-12-01 ENCOUNTER — Encounter: Payer: Self-pay | Admitting: Cardiology

## 2023-12-01 ENCOUNTER — Ambulatory Visit: Attending: Cardiology | Admitting: Cardiology

## 2023-12-01 VITALS — BP 110/60 | HR 70 | Ht 62.0 in | Wt 149.0 lb

## 2023-12-01 DIAGNOSIS — I25709 Atherosclerosis of coronary artery bypass graft(s), unspecified, with unspecified angina pectoris: Secondary | ICD-10-CM | POA: Diagnosis not present

## 2023-12-01 DIAGNOSIS — Z951 Presence of aortocoronary bypass graft: Secondary | ICD-10-CM | POA: Diagnosis not present

## 2023-12-01 DIAGNOSIS — E785 Hyperlipidemia, unspecified: Secondary | ICD-10-CM | POA: Diagnosis not present

## 2023-12-01 DIAGNOSIS — E088 Diabetes mellitus due to underlying condition with unspecified complications: Secondary | ICD-10-CM | POA: Diagnosis not present

## 2023-12-01 DIAGNOSIS — I7121 Aneurysm of the ascending aorta, without rupture: Secondary | ICD-10-CM | POA: Diagnosis not present

## 2023-12-01 DIAGNOSIS — I10 Essential (primary) hypertension: Secondary | ICD-10-CM

## 2023-12-01 NOTE — Progress Notes (Signed)
 Cardiology Office Note:    Date:  12/01/2023   ID:  Mary [name], DOB 04-08-1940, MRN [id number]  PCP:  Trinidad Glisson, MD  Cardiologist:  Jennifer JONELLE Crape, MD   Referring MD: Trinidad Glisson, MD    ASSESSMENT:    1. Aneurysm of ascending aorta without rupture   2. Coronary artery disease involving coronary bypass graft of native heart with angina pectoris   3. Essential (primary) hypertension   4. Diabetes mellitus due to underlying condition with unspecified complications (HCC)   5. H/O coronary artery bypass surgery   6. Dyslipidemia    PLAN:    In order of problems listed above:  Coronary artery disease: Secondary prevention stressed with the patient.  Importance of compliance with diet medication stressed and patient verbalized standing.  She was advised to continue her regular exercise which is excellent. Essential hypertension: Blood pressure is stable and diet was emphasized.  Lifestyle modification urged. Mixed dyslipidemia: On lipid-lowering medications followed by primary care.  She is having blood work done in the next primary care.  Goal LDL less than 60. Ascending aortic aneurysm: I discussed findings and educated her about symptoms of dissection and such.  Questions were answered to satisfaction Patient will be seen in follow-up appointment in 6 months or earlier if the patient has any concerns.   Medication Adjustments/Labs and Tests Ordered: Current medicines are reviewed at length with the patient today.  Concerns regarding medicines are outlined above.  No orders of the defined types were placed in this encounter.  No orders of the defined types were placed in this encounter.    Chief Complaint  Patient presents with   Follow-up     History of Present Illness:    Lekesha [name] is a 83 y.o. female.  Patient has past medical history of ascending aortic aneurysm, coronary artery, essential hypertension and mixed dyslipidemia.  She denies any problems  at this time and takes care of activities of daily living.  She exercises on a regular basis.  She is a diabetic.  No chest pain orthopnea or PND.  At the time of my evaluation, the patient is alert awake oriented and in no distress.  Past Medical History:  Diagnosis Date   Arthritis    Ascending aortic aneurysm 11/22/2019   CAD in native artery 09/18/2014   Cellulitis 04/06/2014   Distal right lower extremity    Coronary artery disease    Coronary artery disease involving coronary bypass graft of native heart with angina pectoris 07/31/2018   Coronavirus infection 10/10/2018   Formatting of this note might be different from the original. This patient presented to the [facility] on 10/07/2018 where she was tested for COVID-19. The patient's POS COVID-19 test resulted on 10/10/2018. The patient's granddaughter, on HIPAA, was contacted by Wanda Derby PA-C for notification on 10/10/2018 at 0935 hr.  Granddaughter reports patient lives at home w   Diabetes mellitus due to underlying condition with unspecified complications (HCC) 04/06/2014   Diabetes mellitus without complication (HCC)    DM II   Dyslipidemia 09/18/2014   Essential (primary) hypertension 09/18/2014   Fever 04/06/2014   H/O coronary artery bypass surgery 09/24/2014   Hyperlipemia    Hypoxia 04/06/2014   Nephrolithiasis 08/22/2021   Primary osteoarthritis of knee 06/02/2015   Primary osteoarthritis of right knee 03/29/2014   Pulmonary hypertension (HCC) 10/05/2021    Past Surgical History:  Procedure Laterality Date   BACK SURGERY  1992   CARDIAC  CATHETERIZATION     CARPAL TUNNEL RELEASE Left    COLONOSCOPY     CORONARY ARTERY BYPASS GRAFT  11/10/1994   x 3   EYE SURGERY Bilateral    Glaucoma   TOTAL KNEE ARTHROPLASTY Right 04/01/2014   Procedure: RIGHT TOTAL KNEE ARTHROPLASTY;  Surgeon: Dempsey Sensor, MD;  Location: [facility] OR;  Service: Orthopedics;  Laterality: Right;   TOTAL KNEE  ARTHROPLASTY Left 06/02/2015   Procedure: LEFT TOTAL KNEE ARTHROPLASTY;  Surgeon: Dempsey Sensor, MD;  Location: [facility] OR;  Service: Orthopedics;  Laterality: Left;   TUBAL LIGATION      Current Medications: Current Meds  Medication Sig   amLODipine  (NORVASC ) 5 MG tablet Take 5 mg by mouth at bedtime.   chlorthalidone  (HYGROTON ) 25 MG tablet Take 1 tablet (25 mg total) by mouth daily.   cloNIDine  (CATAPRES ) 0.2 MG tablet Take 1 tablet by mouth twice daily   clopidogrel  (PLAVIX ) 75 MG tablet Take 1 tablet by mouth once daily   fenofibrate  (TRICOR ) 145 MG tablet Take 1 tablet (145 mg total) by mouth daily.   isosorbide  mononitrate (IMDUR ) 60 MG 24 hr tablet Take 1 tablet (60 mg total) by mouth daily.   Magnesium Oxide 420 MG TABS Take 1 tablet by mouth daily.   metoprolol  succinate (TOPROL -XL) 50 MG 24 hr tablet Take 1 tablet (50 mg total) by mouth daily.   nitroGLYCERIN  (NITROSTAT ) 0.4 MG SL tablet Place 1 tablet (0.4 mg total) under the tongue every 5 (five) minutes as needed for chest pain.   pantoprazole (PROTONIX) 20 MG tablet Take 20 mg by mouth daily.   ranolazine  (RANEXA ) 500 MG 12 hr tablet Take 1 tablet (500 mg total) by mouth 2 (two) times daily.   rosuvastatin  (CRESTOR ) 20 MG tablet Take 20 mg by mouth daily.   SYNJARDY XR 25-1000 MG TB24 Take 1 tablet by mouth daily.   traMADol (ULTRAM) 50 MG tablet Take 50 mg by mouth 3 (three) times daily as needed.   valsartan  (DIOVAN ) 320 MG tablet Take 1 tablet (320 mg total) by mouth daily.     Allergies:   Patient has no known allergies.   Social History   Socioeconomic History   Marital status: Single    Spouse name: Not on file   Number of children: Not on file   Years of education: Not on file   Highest education level: Not on file  Occupational History   Not on file  Tobacco Use   Smoking status: Former    Current packs/day: 0.00    Average packs/day: 0.5 packs/day for 5.0 years (2.5 ttl pk-yrs)    Types: Cigarettes    Start  date: 04/05/1980    Quit date: 04/05/1985    Years since quitting: 38.6   Smokeless tobacco: Never  Vaping Use   Vaping status: Never Used  Substance and Sexual Activity   Alcohol  use: No   Drug use: No   Sexual activity: Never  Other Topics Concern   Not on file  Social History Narrative   Not on file   Social Drivers of Health   Financial Resource Strain: Not on file  Food Insecurity: Not on file  Transportation Needs: Not on file  Physical Activity: Not on file  Stress: Not on file  Social Connections: Not on file     Family History: The patient's family history includes Diabetes in her father; Heart Problems in her mother; High Cholesterol in her mother; Hypertension in her brother; Stroke in her  mother.  ROS:   Please see the history of present illness.    All other systems reviewed and are negative.  EKGs/Labs/Other Studies Reviewed:    The following studies were reviewed today: .SABRA   I discussed my findings with the patient at length   Recent Labs: No results found for requested labs within last 365 days.  Recent Lipid Panel    Component Value Date/Time   CHOL 176 07/22/2021 0928   TRIG 147 07/22/2021 0928   HDL 55 07/22/2021 0928   CHOLHDL 3.2 07/22/2021 0928   LDLCALC 95 07/22/2021 0928    Physical Exam:    VS:  BP 110/60   Pulse 70   Ht 5' 2 (1.575 m)   Wt 149 lb (67.6 kg)   SpO2 98%   BMI 27.25 kg/m     Wt Readings from Last 3 Encounters:  12/01/23 149 lb (67.6 kg)  03/21/23 155 lb (70.3 kg)  02/24/23 150 lb 12.8 oz (68.4 kg)     GEN: Patient is in no acute distress HEENT: Normal NECK: No JVD; No carotid bruits LYMPHATICS: No lymphadenopathy CARDIAC: Hear sounds regular, 2/6 systolic murmur at the apex. RESPIRATORY:  Clear to auscultation without rales, wheezing or rhonchi  ABDOMEN: Soft, non-tender, non-distended MUSCULOSKELETAL:  No edema; No deformity  SKIN: Warm and dry NEUROLOGIC:  Alert and oriented x 3 PSYCHIATRIC:  Normal  affect   Signed, Jennifer JONELLE Crape, MD  12/01/2023 10:31 AM    [facility]

## 2023-12-01 NOTE — Patient Instructions (Signed)
Medication Instructions:  Your physician recommends that you continue on your current medications as directed. Please refer to the Current Medication list given to you today.  *If you need a refill on your cardiac medications before your next appointment, please call your pharmacy*   Lab Work: None Ordered If you have labs (blood work) drawn today and your tests are completely normal, you will receive your results only by: MyChart Message (if you have MyChart) OR A paper copy in the mail If you have any lab test that is abnormal or we need to change your treatment, we will call you to review the results.   Testing/Procedures: None Ordered   Follow-Up: At [facility], you and your health needs are our priority.  As part of our continuing mission to provide you with exceptional heart care, we have created designated Provider Care Teams.  These Care Teams include your primary Cardiologist (physician) and Advanced Practice Providers (APPs -  Physician Assistants and Nurse Practitioners) who all work together to provide you with the care you need, when you need it.  We recommend signing up for the patient portal called "MyChart".  Sign up information is provided on this After Visit Summary.  MyChart is used to connect with patients for Virtual Visits (Telemedicine).  Patients are able to view lab/test results, encounter notes, upcoming appointments, etc.  Non-urgent messages can be sent to your provider as well.   To learn more about what you can do with MyChart, go to [email or web address].    Your next appointment:   9 month(s)  The format for your next appointment:   In Person  Provider:   Robert Krasowski, MD    Other Instructions NA  

## 2023-12-04 ENCOUNTER — Other Ambulatory Visit: Payer: Self-pay | Admitting: Cardiology

## 2023-12-04 DIAGNOSIS — I10 Essential (primary) hypertension: Secondary | ICD-10-CM

## 2023-12-10 ENCOUNTER — Other Ambulatory Visit: Payer: Self-pay | Admitting: Cardiology

## 2023-12-16 DIAGNOSIS — E1129 Type 2 diabetes mellitus with other diabetic kidney complication: Secondary | ICD-10-CM | POA: Diagnosis not present

## 2023-12-16 DIAGNOSIS — I152 Hypertension secondary to endocrine disorders: Secondary | ICD-10-CM | POA: Diagnosis not present

## 2023-12-16 DIAGNOSIS — E1159 Type 2 diabetes mellitus with other circulatory complications: Secondary | ICD-10-CM | POA: Diagnosis not present

## 2023-12-16 DIAGNOSIS — N1832 Chronic kidney disease, stage 3b: Secondary | ICD-10-CM | POA: Diagnosis not present

## 2023-12-16 DIAGNOSIS — E785 Hyperlipidemia, unspecified: Secondary | ICD-10-CM | POA: Diagnosis not present

## 2023-12-16 DIAGNOSIS — Z6825 Body mass index (BMI) 25.0-25.9, adult: Secondary | ICD-10-CM | POA: Diagnosis not present

## 2023-12-16 DIAGNOSIS — Z23 Encounter for immunization: Secondary | ICD-10-CM | POA: Diagnosis not present

## 2023-12-28 DIAGNOSIS — E1129 Type 2 diabetes mellitus with other diabetic kidney complication: Secondary | ICD-10-CM | POA: Diagnosis not present

## 2024-02-17 ENCOUNTER — Other Ambulatory Visit: Payer: Self-pay | Admitting: Cardiology
